# Patient Record
Sex: Female | Born: 1945 | Race: White | Hispanic: No | Marital: Married | Smoking: Current every day smoker
Health system: Southern US, Community
[De-identification: ages and names within clinical notes are randomized; demographics above are authoritative.]

## PROBLEM LIST (undated history)

## (undated) DIAGNOSIS — E7800 Pure hypercholesterolemia, unspecified: Secondary | ICD-10-CM

## (undated) DIAGNOSIS — K219 Gastro-esophageal reflux disease without esophagitis: Secondary | ICD-10-CM

## (undated) DIAGNOSIS — I10 Essential (primary) hypertension: Secondary | ICD-10-CM

## (undated) DIAGNOSIS — G459 Transient cerebral ischemic attack, unspecified: Secondary | ICD-10-CM

## (undated) DIAGNOSIS — D649 Anemia, unspecified: Secondary | ICD-10-CM

## (undated) DIAGNOSIS — K56609 Unspecified intestinal obstruction, unspecified as to partial versus complete obstruction: Secondary | ICD-10-CM

## (undated) DIAGNOSIS — M8580 Other specified disorders of bone density and structure, unspecified site: Secondary | ICD-10-CM

## (undated) DIAGNOSIS — K5010 Crohn's disease of large intestine without complications: Secondary | ICD-10-CM

## (undated) HISTORY — DX: Crohn's disease of large intestine without complications: K50.10

## (undated) HISTORY — DX: Gastro-esophageal reflux disease without esophagitis: K21.9

## (undated) HISTORY — DX: Other specified disorders of bone density and structure, unspecified site: M85.80

## (undated) HISTORY — DX: Transient cerebral ischemic attack, unspecified: G45.9

## (undated) HISTORY — DX: Anemia, unspecified: D64.9

## (undated) HISTORY — DX: Unspecified intestinal obstruction, unspecified as to partial versus complete obstruction: K56.609

## (undated) HISTORY — DX: Pure hypercholesterolemia, unspecified: E78.00

---

## 2000-01-08 ENCOUNTER — Ambulatory Visit (HOSPITAL_COMMUNITY): Admission: RE | Admit: 2000-01-08 | Discharge: 2000-01-08 | Payer: Self-pay | Admitting: Internal Medicine

## 2002-09-13 ENCOUNTER — Encounter: Payer: Self-pay | Admitting: Emergency Medicine

## 2002-09-13 ENCOUNTER — Inpatient Hospital Stay (HOSPITAL_COMMUNITY): Admission: EM | Admit: 2002-09-13 | Discharge: 2002-09-17 | Payer: Self-pay | Admitting: Emergency Medicine

## 2002-09-14 ENCOUNTER — Encounter: Payer: Self-pay | Admitting: Internal Medicine

## 2002-09-15 ENCOUNTER — Encounter (INDEPENDENT_AMBULATORY_CARE_PROVIDER_SITE_OTHER): Payer: Self-pay | Admitting: *Deleted

## 2002-11-10 ENCOUNTER — Encounter: Admission: RE | Admit: 2002-11-10 | Discharge: 2003-02-08 | Payer: Self-pay | Admitting: Internal Medicine

## 2002-12-31 ENCOUNTER — Encounter: Payer: Self-pay | Admitting: *Deleted

## 2002-12-31 ENCOUNTER — Ambulatory Visit (HOSPITAL_COMMUNITY): Admission: RE | Admit: 2002-12-31 | Discharge: 2002-12-31 | Payer: Self-pay | Admitting: *Deleted

## 2003-01-01 ENCOUNTER — Encounter (INDEPENDENT_AMBULATORY_CARE_PROVIDER_SITE_OTHER): Payer: Self-pay | Admitting: Specialist

## 2003-01-01 ENCOUNTER — Inpatient Hospital Stay (HOSPITAL_COMMUNITY): Admission: AD | Admit: 2003-01-01 | Discharge: 2003-01-06 | Payer: Self-pay | Admitting: Internal Medicine

## 2003-01-01 ENCOUNTER — Encounter: Payer: Self-pay | Admitting: Internal Medicine

## 2004-03-15 ENCOUNTER — Other Ambulatory Visit: Admission: RE | Admit: 2004-03-15 | Discharge: 2004-03-15 | Payer: Self-pay | Admitting: Internal Medicine

## 2004-04-06 ENCOUNTER — Ambulatory Visit (HOSPITAL_COMMUNITY): Admission: RE | Admit: 2004-04-06 | Discharge: 2004-04-06 | Payer: Self-pay | Admitting: Internal Medicine

## 2005-10-12 ENCOUNTER — Encounter: Admission: RE | Admit: 2005-10-12 | Discharge: 2005-10-12 | Payer: Self-pay | Admitting: Internal Medicine

## 2006-05-11 ENCOUNTER — Emergency Department (HOSPITAL_COMMUNITY): Admission: EM | Admit: 2006-05-11 | Discharge: 2006-05-11 | Payer: Self-pay | Admitting: Emergency Medicine

## 2006-07-05 ENCOUNTER — Inpatient Hospital Stay (HOSPITAL_COMMUNITY): Admission: EM | Admit: 2006-07-05 | Discharge: 2006-07-11 | Payer: Self-pay | Admitting: Emergency Medicine

## 2006-07-08 ENCOUNTER — Ambulatory Visit: Payer: Self-pay | Admitting: Internal Medicine

## 2006-07-10 ENCOUNTER — Encounter (INDEPENDENT_AMBULATORY_CARE_PROVIDER_SITE_OTHER): Payer: Self-pay | Admitting: Specialist

## 2006-11-12 ENCOUNTER — Encounter: Admission: RE | Admit: 2006-11-12 | Discharge: 2006-11-12 | Payer: Self-pay | Admitting: Gastroenterology

## 2006-11-21 ENCOUNTER — Encounter: Admission: RE | Admit: 2006-11-21 | Discharge: 2006-11-21 | Payer: Self-pay | Admitting: Gastroenterology

## 2007-04-06 ENCOUNTER — Emergency Department (HOSPITAL_COMMUNITY): Admission: EM | Admit: 2007-04-06 | Discharge: 2007-04-06 | Payer: Self-pay | Admitting: Emergency Medicine

## 2007-04-15 ENCOUNTER — Other Ambulatory Visit: Admission: RE | Admit: 2007-04-15 | Discharge: 2007-04-15 | Payer: Self-pay | Admitting: *Deleted

## 2007-07-07 ENCOUNTER — Inpatient Hospital Stay (HOSPITAL_COMMUNITY): Admission: EM | Admit: 2007-07-07 | Discharge: 2007-07-16 | Payer: Self-pay | Admitting: Emergency Medicine

## 2007-07-07 ENCOUNTER — Ambulatory Visit: Payer: Self-pay | Admitting: Internal Medicine

## 2007-07-08 ENCOUNTER — Encounter (INDEPENDENT_AMBULATORY_CARE_PROVIDER_SITE_OTHER): Payer: Self-pay | Admitting: General Surgery

## 2007-07-08 HISTORY — PX: COLON SURGERY: SHX602

## 2007-08-07 ENCOUNTER — Inpatient Hospital Stay (HOSPITAL_COMMUNITY): Admission: AD | Admit: 2007-08-07 | Discharge: 2007-08-11 | Payer: Self-pay | Admitting: Gastroenterology

## 2007-09-10 ENCOUNTER — Inpatient Hospital Stay (HOSPITAL_COMMUNITY): Admission: EM | Admit: 2007-09-10 | Discharge: 2007-09-13 | Payer: Self-pay | Admitting: Emergency Medicine

## 2007-09-10 ENCOUNTER — Ambulatory Visit: Payer: Self-pay | Admitting: Internal Medicine

## 2008-02-26 ENCOUNTER — Encounter (INDEPENDENT_AMBULATORY_CARE_PROVIDER_SITE_OTHER): Payer: Self-pay | Admitting: General Surgery

## 2008-02-26 ENCOUNTER — Inpatient Hospital Stay (HOSPITAL_COMMUNITY): Admission: RE | Admit: 2008-02-26 | Discharge: 2008-03-02 | Payer: Self-pay | Admitting: General Surgery

## 2008-02-26 HISTORY — PX: ILEOSTOMY: SHX1783

## 2008-03-29 IMAGING — CR DG ABDOMEN ACUTE W/ 1V CHEST
3 series · 3 of 3 positions shown · non-contrast
Comparison: 07/08/2007

CLINICAL DATA: Ileostomy one month ago. Nausea, vomiting

ABDOMEN SERIES - 2 VIEW & CHEST - 1 VIEW

[w chest pa]
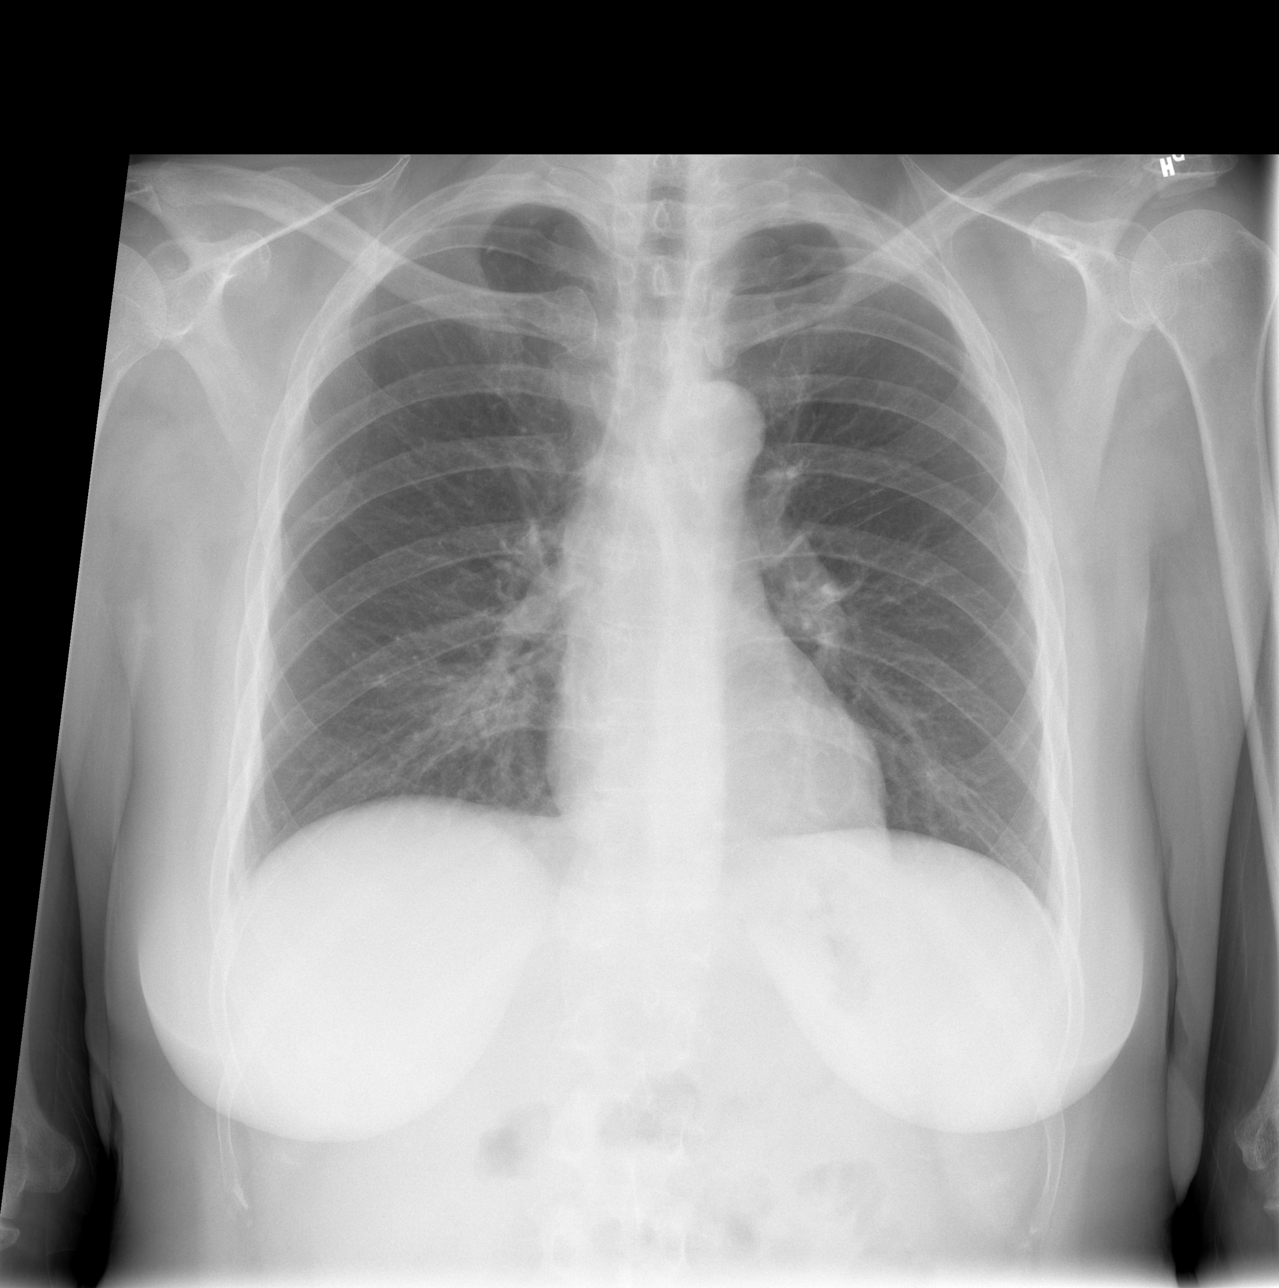

[w abdomen upright]
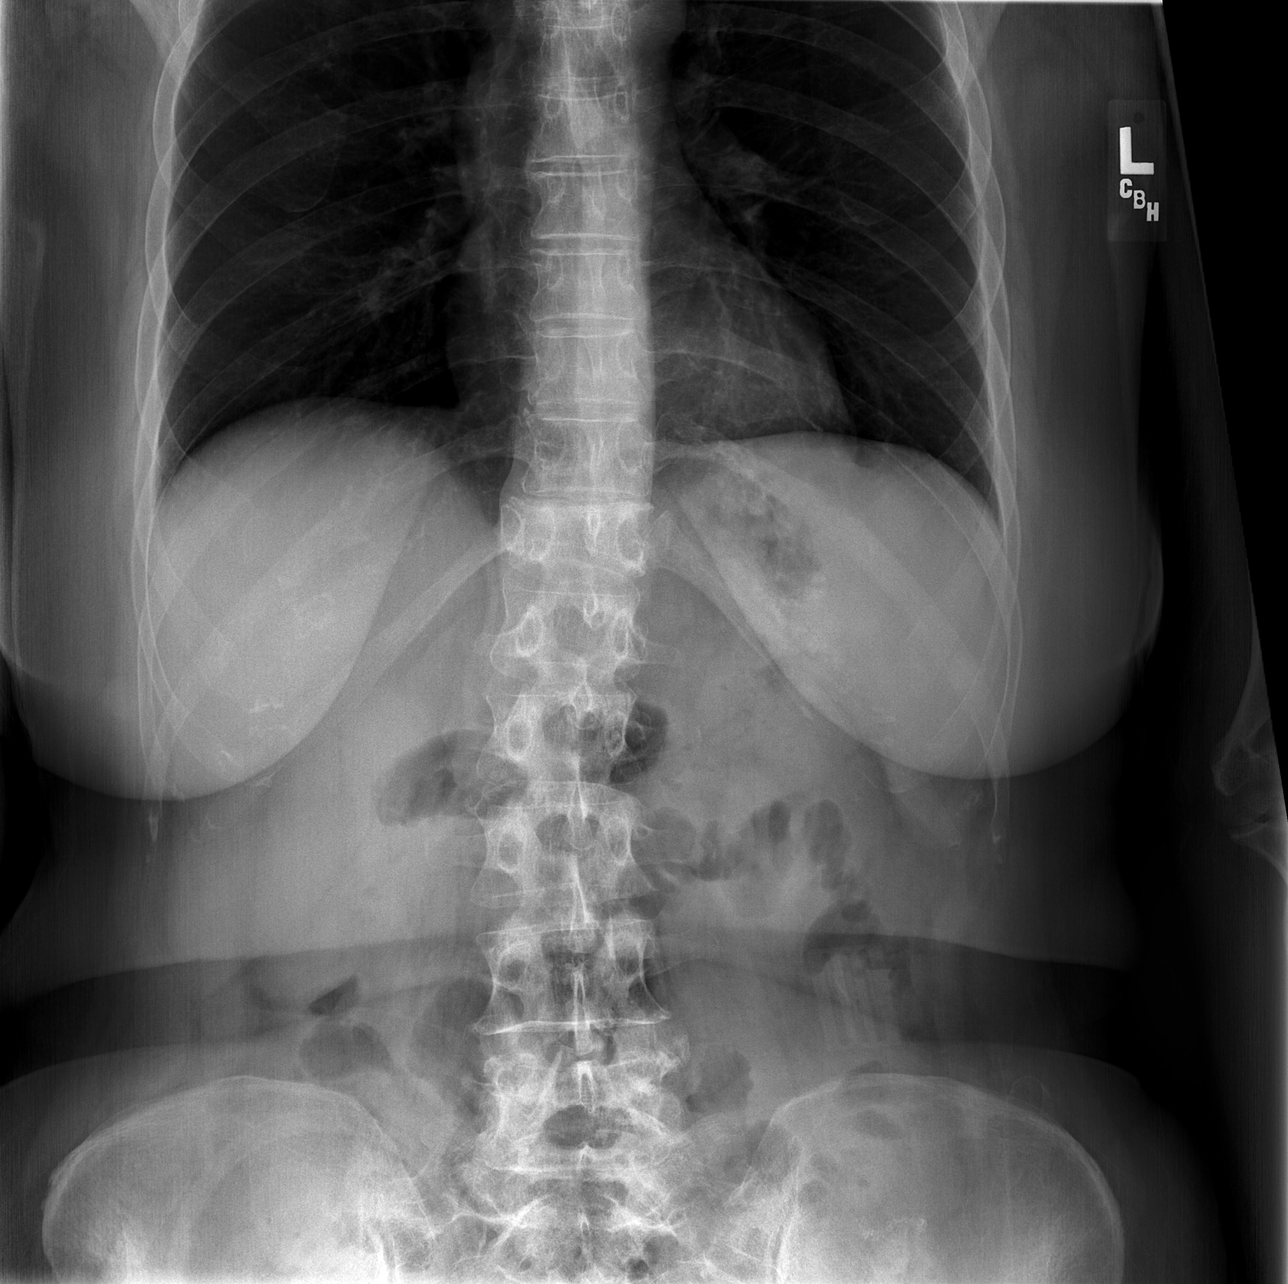

[t abdomen supine]
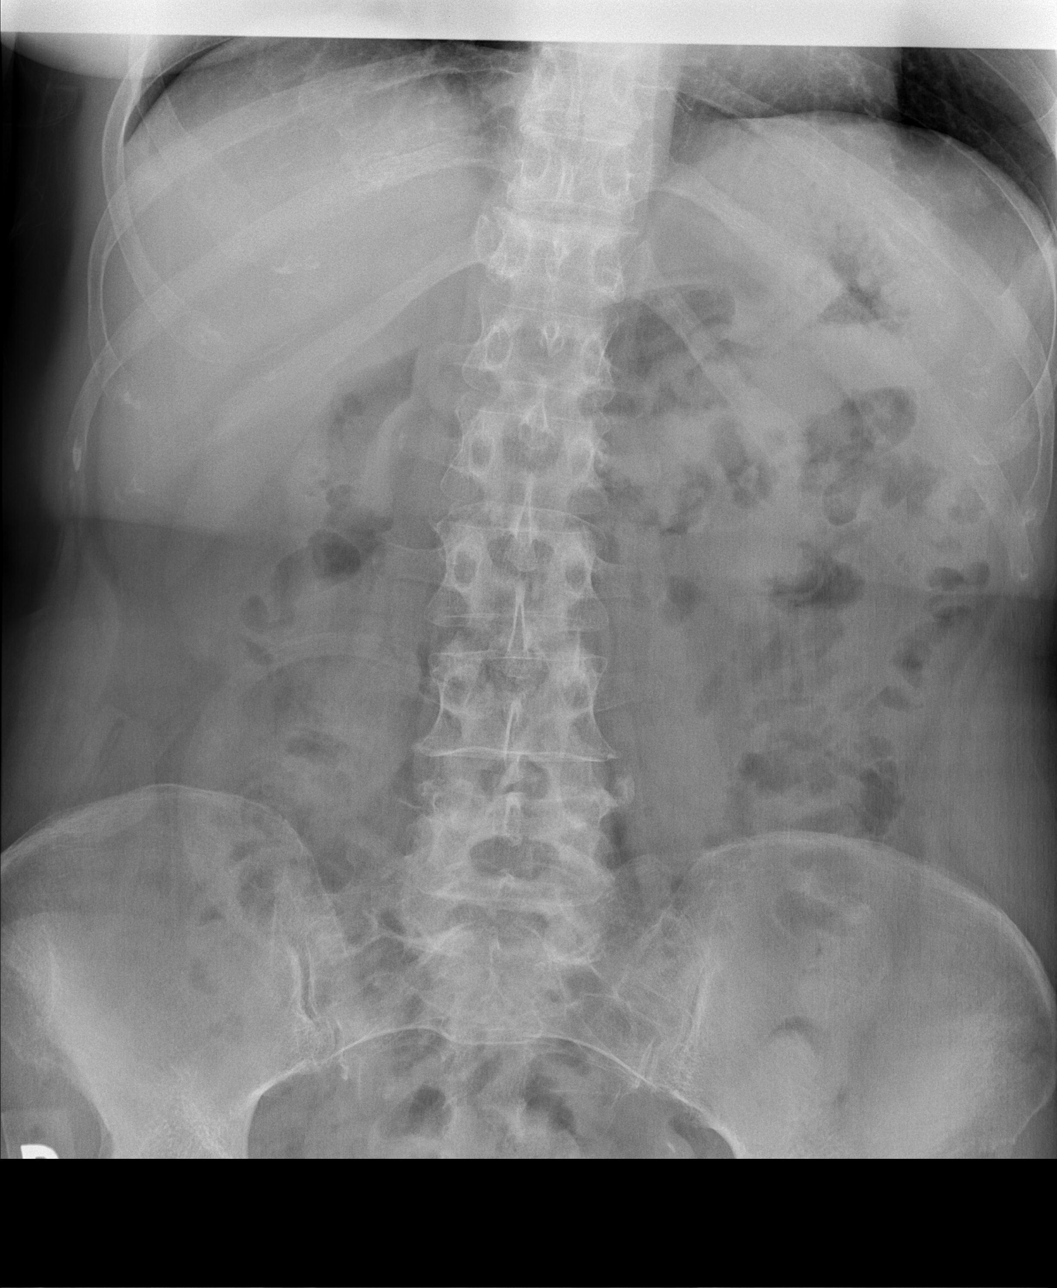

[3 of 3 positions shown; findings below may reference images not displayed]

FINDINGS: There a nonobstructed bowel gas pattern. No free air, organomegaly,
or suspicious calcification.

Heart is normal size. Lungs are clear. Visualized skeleton unremarkable except
for degenerative changes in the lower lumbar spine.

IMPRESSION

No obstruction or free air. No acute findings.

## 2008-05-02 IMAGING — US US RENAL PORT
1 series · 14 of 22 positions shown · non-contrast
Comparison: none

CLINICAL DATA: Abdominal pain, vomiting, history of Crohn's disease.  Evaluate for obstruction.
PORTABLE RENAL ULTRASOUND:
TECHNIQUE: Portable ultrasound examination of the urinary tract was performed including evaluation of the kidneys, renal collecting systems, and urinary bladder.

[Series 1: unknown · 0.33mm/px · 14 of 22 slices shown]
[im 1/22]
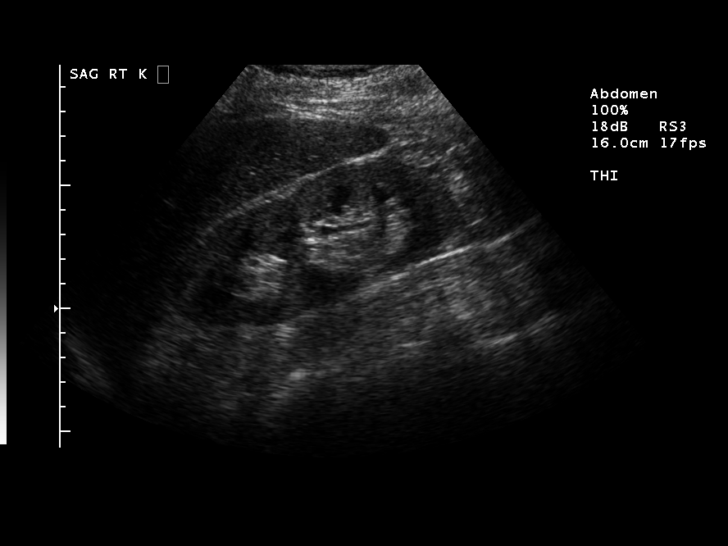
[im 3/22]
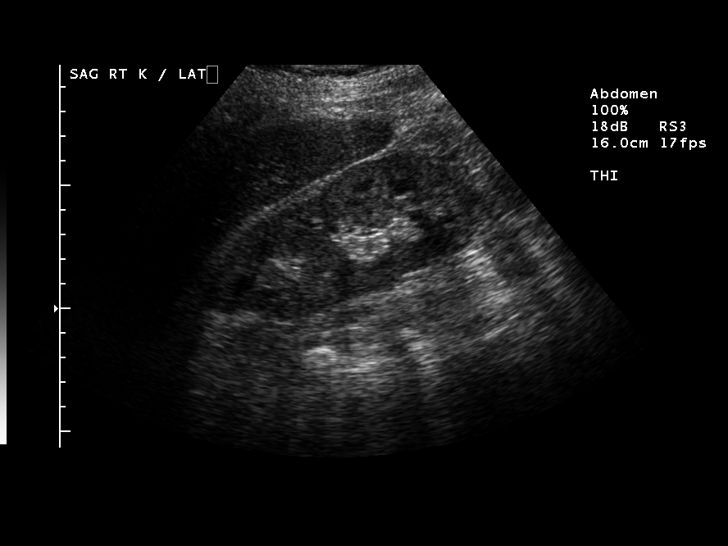
[im 4/22]
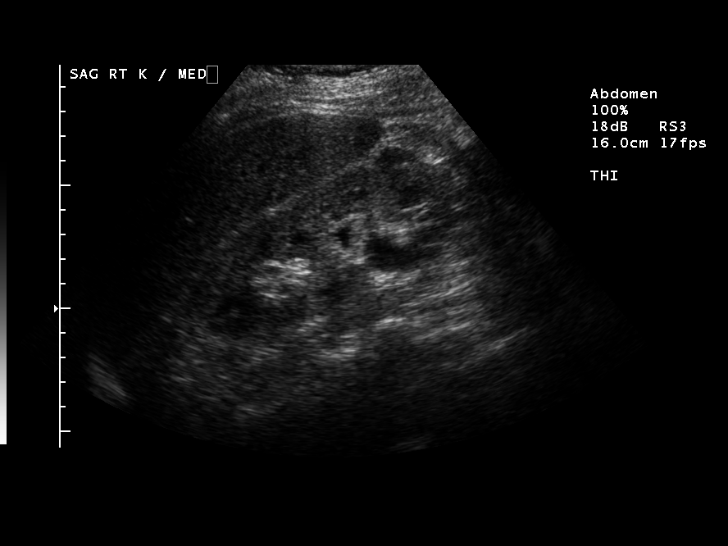
[im 6/22]
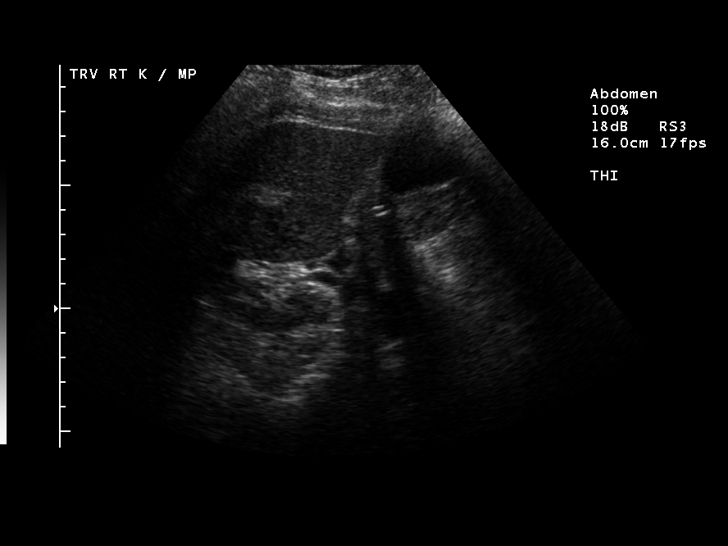
[im 8/22]
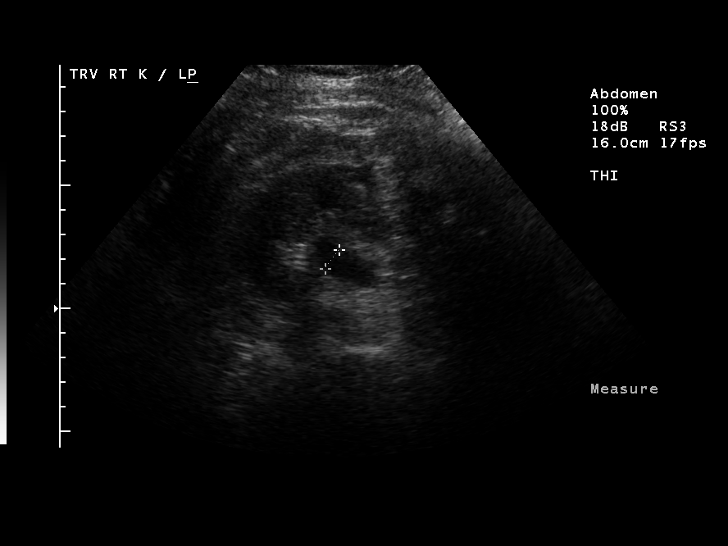
[im 9/22]
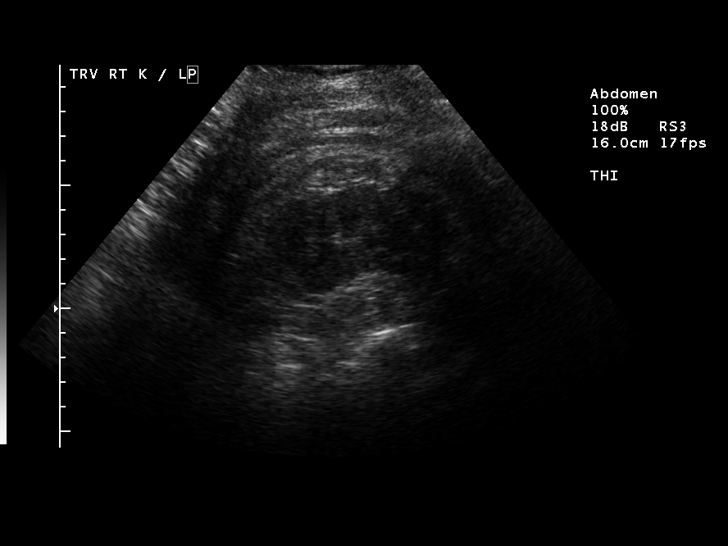
[im 11/22]
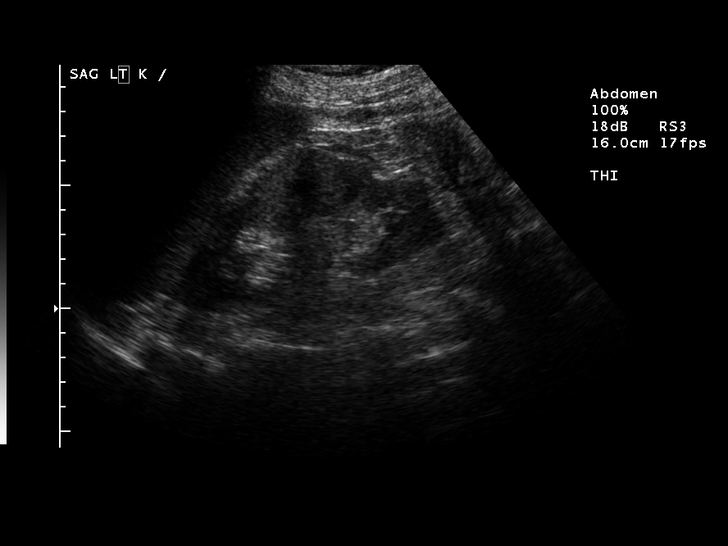
[im 12/22]
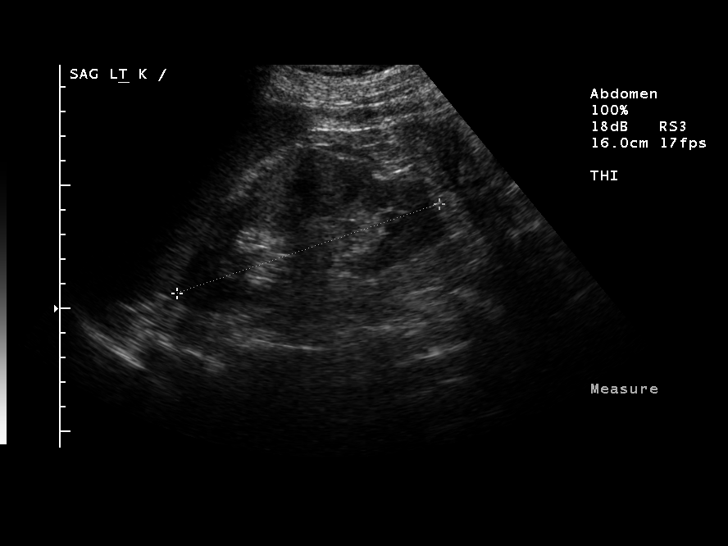
[im 14/22]
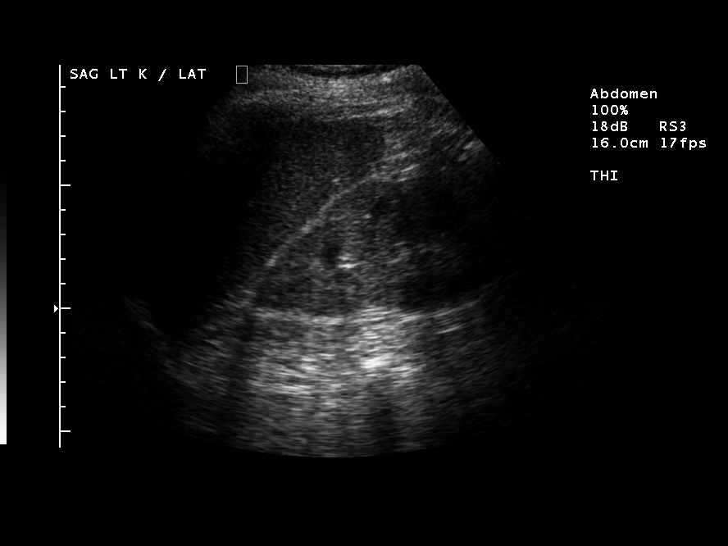
[im 15/22]
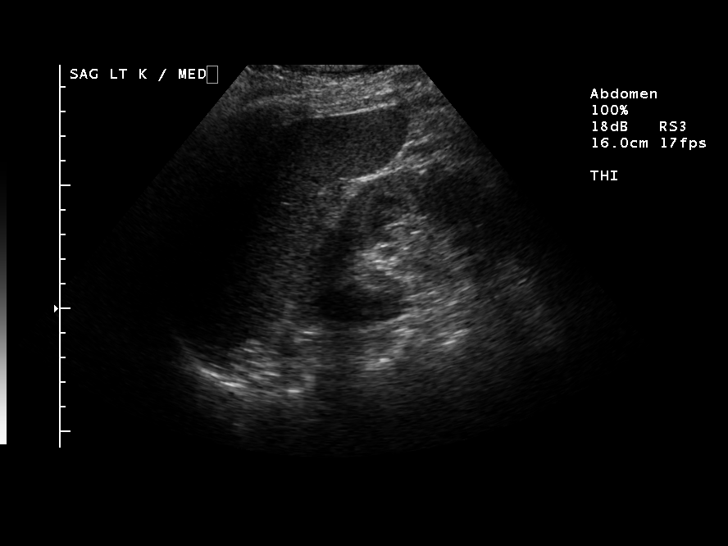
[im 17/22]
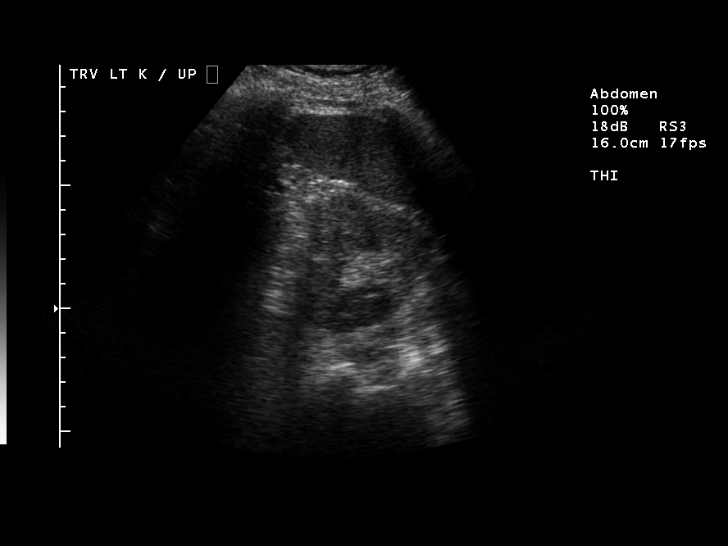
[im 19/22]
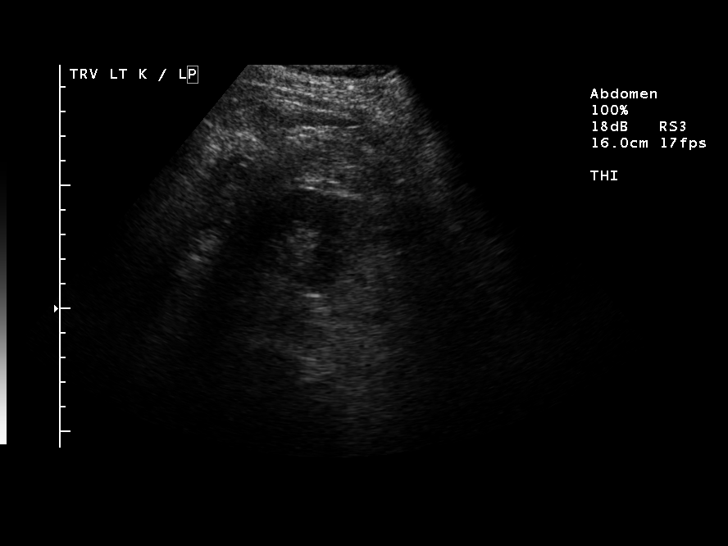
[im 20/22]
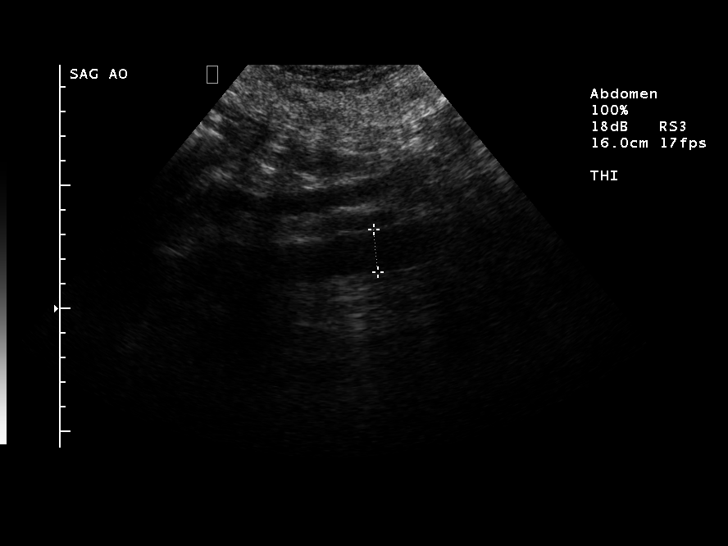
[im 22/22]
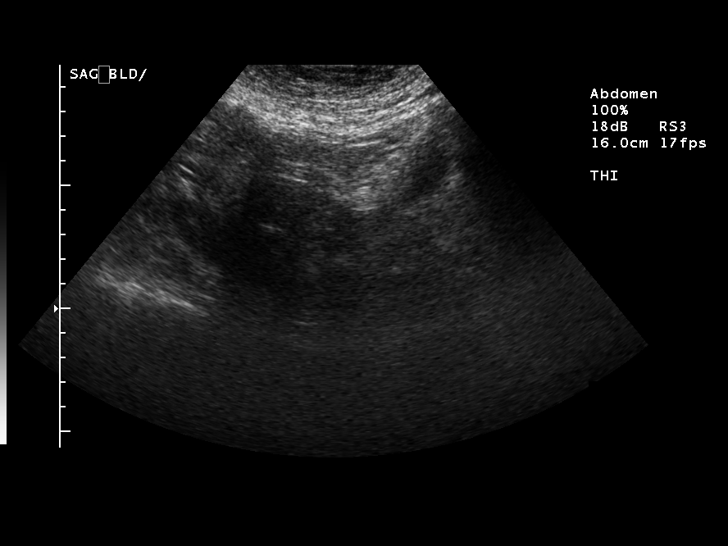

[14 of 22 positions shown; findings below may reference images not displayed]

FINDINGS: Right kidney measures 12.2 cm in length.  No significant hydronephrosis or shadowing calculi appreciated.  Right kidney demonstrates a prominent central column of cortex suspicious for a duplicated collecting system.  There is slight pelviectasis of the right kidney lower pole, nonspecific in appearance.  The left kidney is normal in appearance without hydronephrosis or obstruction.  The left renal length is 11.2 cm.   The bladder is decompressed with a Foley catheter.  Aorta demonstrates no evidence of aneurysm or dilatation.  No ascites or free fluid.
IMPRESSION: 1.  Right kidney duplicated system suspected with mild pelviectasis of the lower pole.  No significant hydronephrosis or obstruction. 
2.  Normal left kidney.

## 2008-08-12 ENCOUNTER — Other Ambulatory Visit: Admission: RE | Admit: 2008-08-12 | Discharge: 2008-08-12 | Payer: Self-pay | Admitting: Internal Medicine

## 2009-04-26 ENCOUNTER — Encounter: Admission: RE | Admit: 2009-04-26 | Discharge: 2009-04-26 | Payer: Self-pay | Admitting: Gastroenterology

## 2009-09-21 ENCOUNTER — Other Ambulatory Visit: Admission: RE | Admit: 2009-09-21 | Discharge: 2009-09-21 | Payer: Self-pay | Admitting: Internal Medicine

## 2009-10-20 ENCOUNTER — Ambulatory Visit: Payer: Self-pay | Admitting: Diagnostic Radiology

## 2009-10-20 ENCOUNTER — Encounter: Payer: Self-pay | Admitting: Emergency Medicine

## 2009-10-21 ENCOUNTER — Inpatient Hospital Stay (HOSPITAL_COMMUNITY): Admission: EM | Admit: 2009-10-21 | Discharge: 2009-10-23 | Payer: Self-pay | Admitting: Emergency Medicine

## 2009-11-04 ENCOUNTER — Emergency Department (HOSPITAL_COMMUNITY): Admission: EM | Admit: 2009-11-04 | Discharge: 2009-11-05 | Payer: Self-pay | Admitting: Emergency Medicine

## 2009-11-21 ENCOUNTER — Inpatient Hospital Stay (HOSPITAL_COMMUNITY): Admission: RE | Admit: 2009-11-21 | Discharge: 2009-11-24 | Payer: Self-pay | Admitting: General Surgery

## 2009-11-21 HISTORY — PX: HERNIA REPAIR: SHX51

## 2010-06-13 IMAGING — CR DG ABDOMEN ACUTE W/ 1V CHEST
3 series · 3 of 3 positions shown · non-contrast
Comparison: CT abdomen pelvis 10/20/2009 and chest x-ray 02/23/2008

CLINICAL DATA: Small bowel obstruction.  Nasogastric tube
placement.  Diarrhea with nausea and vomiting.

ACUTE ABDOMEN SERIES (ABDOMEN 2 VIEW & CHEST 1 VIEW)

[w chest pa]
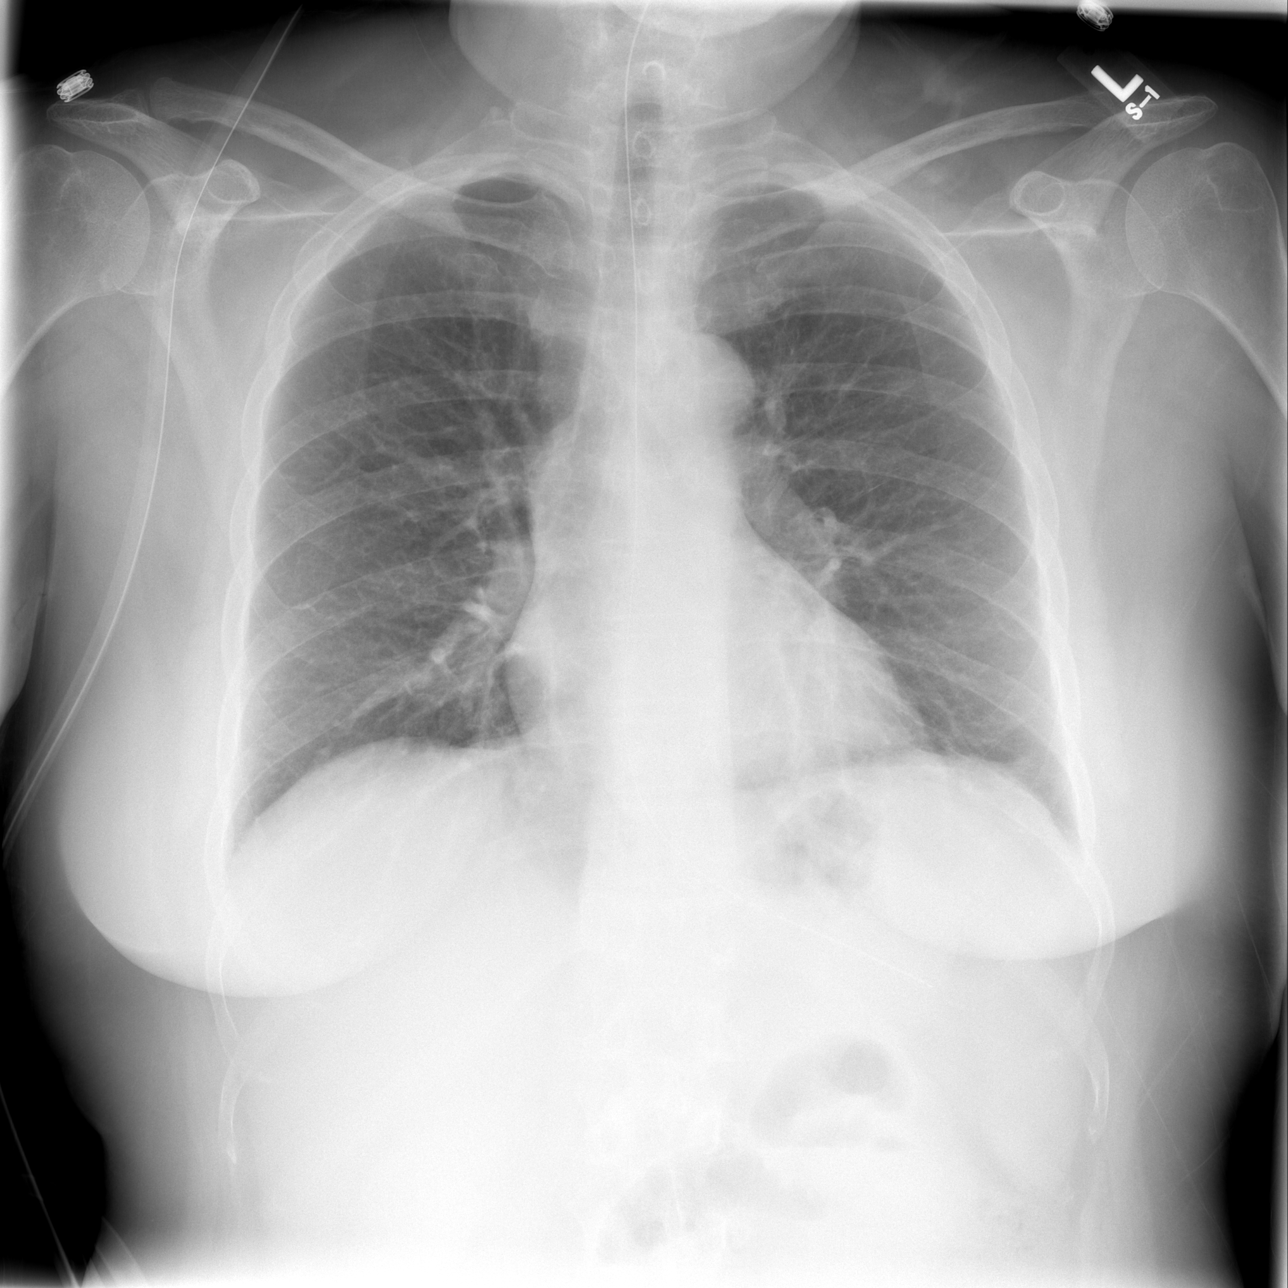

[w abdomen upright]
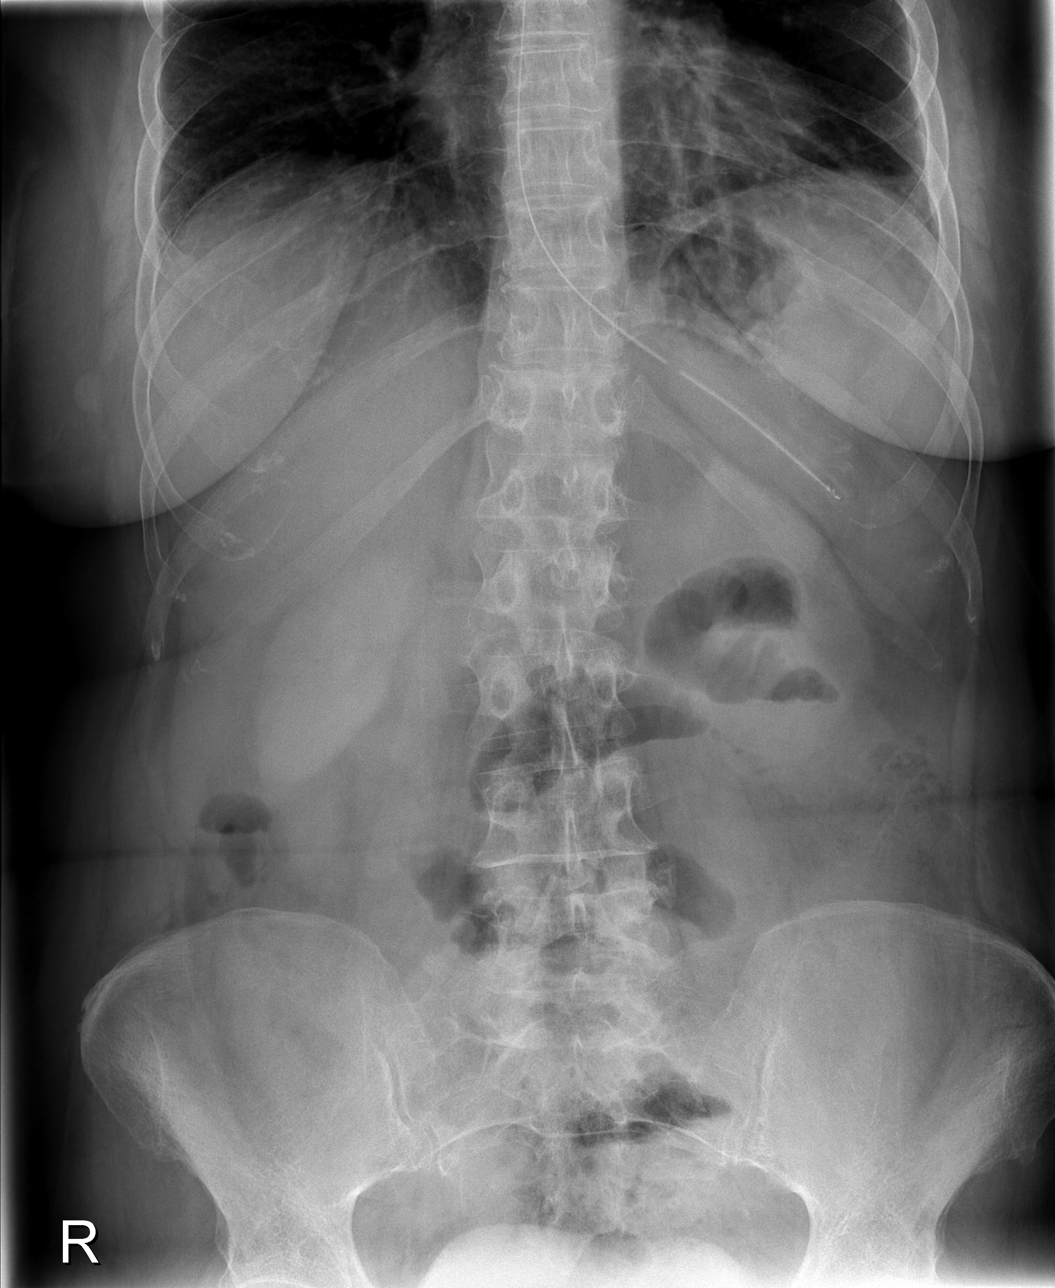

[t abdomen supine]
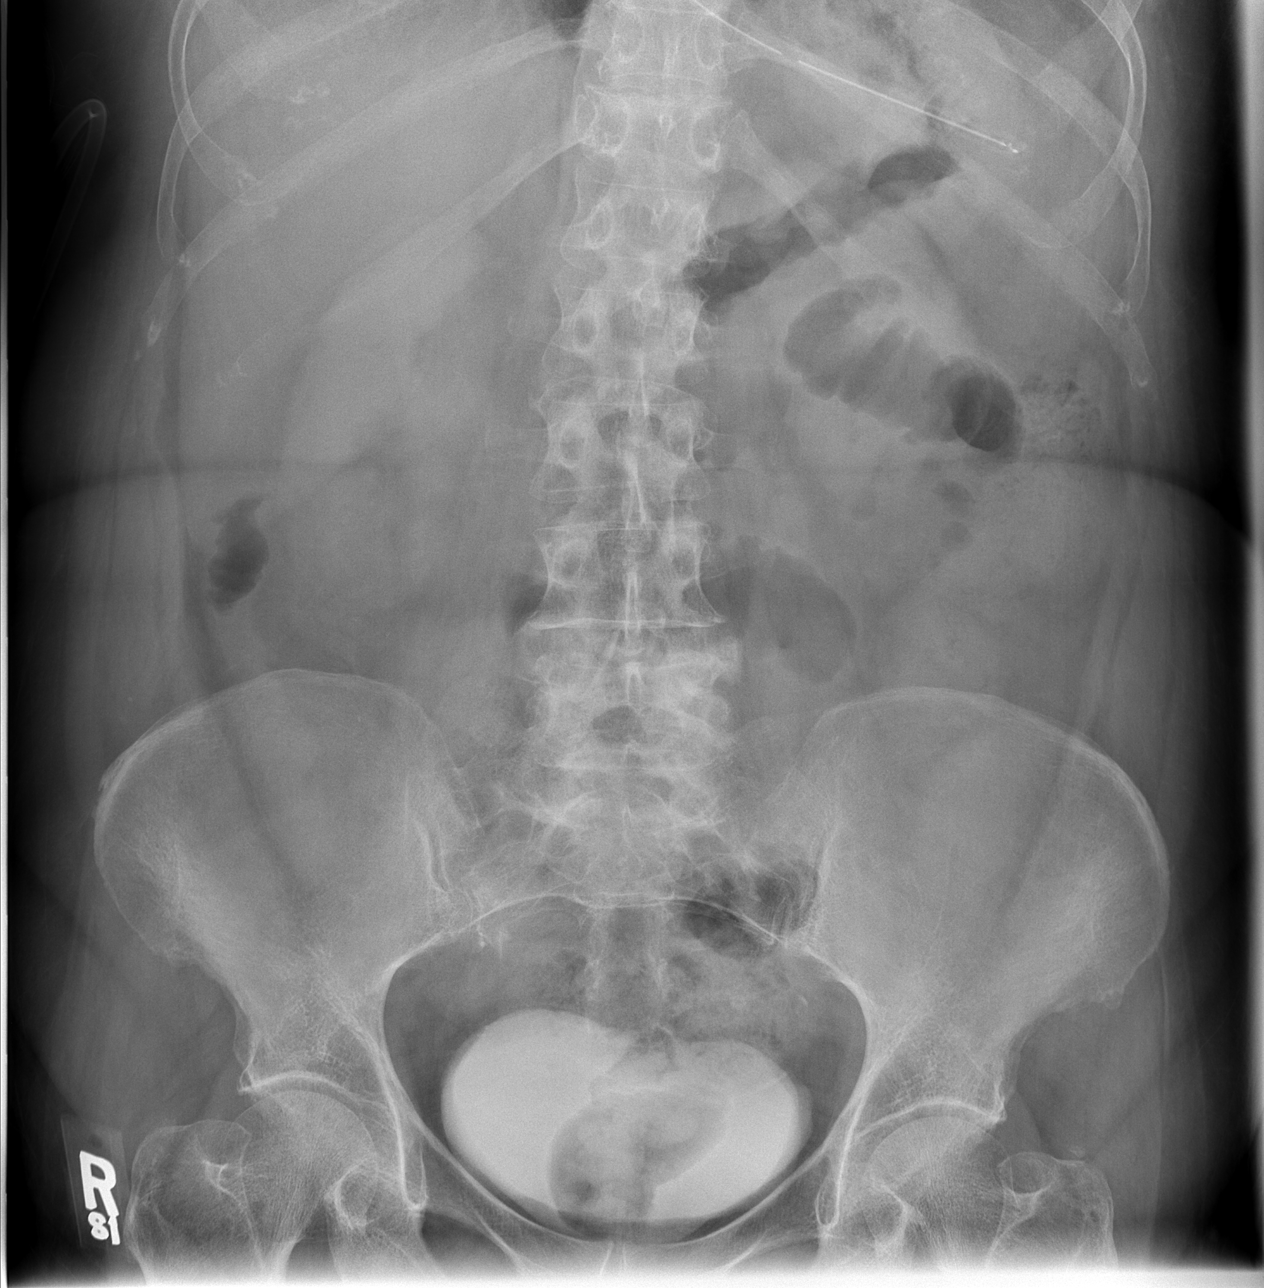

[3 of 3 positions shown; findings below may reference images not displayed]

FINDINGS: Frontal view of the chest shows midline trachea and
normal heart size.  Nasogastric tube terminates in the stomach.
Minimal bibasilar subsegmental atelectasis.

Two views of the abdomen show mildly prominent loops of gas-filled
small bowel in the central abdomen.  Minimal colonic gas.  Gas and
stool are seen in the rectosigmoid colon.  Contrast is seen in the
bladder.
IMPRESSION: Small bowel obstructive pattern may have improved minimally after
nasogastric tube placement.

## 2010-11-22 LAB — BASIC METABOLIC PANEL
BUN: 11 mg/dL (ref 6–23)
BUN: 26 mg/dL — ABNORMAL HIGH (ref 6–23)
CO2: 25 mEq/L (ref 19–32)
CO2: 26 mEq/L (ref 19–32)
Calcium: 8 mg/dL — ABNORMAL LOW (ref 8.4–10.5)
Calcium: 8.3 mg/dL — ABNORMAL LOW (ref 8.4–10.5)
Chloride: 105 mEq/L (ref 96–112)
Chloride: 110 mEq/L (ref 96–112)
Creatinine, Ser: 0.83 mg/dL (ref 0.4–1.2)
Creatinine, Ser: 1.19 mg/dL (ref 0.4–1.2)
GFR calc Af Amer: 55 mL/min — ABNORMAL LOW (ref >60)
GFR calc Af Amer: >60 mL/min (ref >60)
GFR calc non Af Amer: 46 mL/min — ABNORMAL LOW (ref >60)
GFR calc non Af Amer: >60 mL/min (ref >60)
Glucose, Bld: 117 mg/dL — ABNORMAL HIGH (ref 70–99)
Glucose, Bld: 289 mg/dL — ABNORMAL HIGH (ref 70–99)
Potassium: 4 mEq/L (ref 3.5–5.1)
Potassium: 4.7 mEq/L (ref 3.5–5.1)
Sodium: 135 mEq/L (ref 135–145)
Sodium: 140 mEq/L (ref 135–145)

## 2010-11-22 LAB — CBC
HCT: 31.3 % — ABNORMAL LOW (ref 36.0–46.0)
HCT: 34.5 % — ABNORMAL LOW (ref 36.0–46.0)
Hemoglobin: 11 g/dL — ABNORMAL LOW (ref 12.0–15.0)
Hemoglobin: 12.1 g/dL (ref 12.0–15.0)
MCHC: 35.1 g/dL (ref 30.0–36.0)
MCHC: 35.1 g/dL (ref 30.0–36.0)
MCV: 94.9 fL (ref 78.0–100.0)
MCV: 95 fL (ref 78.0–100.0)
Platelets: 182 10*3/uL (ref 150–400)
Platelets: 214 10*3/uL (ref 150–400)
RBC: 3.3 MIL/uL — ABNORMAL LOW (ref 3.87–5.11)
RBC: 3.63 MIL/uL — ABNORMAL LOW (ref 3.87–5.11)
RDW: 12.6 % (ref 11.5–15.5)
RDW: 12.7 % (ref 11.5–15.5)
WBC: 14.5 10*3/uL — ABNORMAL HIGH (ref 4.0–10.5)
WBC: 6.1 10*3/uL (ref 4.0–10.5)

## 2010-11-22 LAB — GLUCOSE, CAPILLARY
Glucose-Capillary: 110 mg/dL — ABNORMAL HIGH (ref 70–99)
Glucose-Capillary: 124 mg/dL — ABNORMAL HIGH (ref 70–99)
Glucose-Capillary: 126 mg/dL — ABNORMAL HIGH (ref 70–99)
Glucose-Capillary: 160 mg/dL — ABNORMAL HIGH (ref 70–99)
Glucose-Capillary: 170 mg/dL — ABNORMAL HIGH (ref 70–99)
Glucose-Capillary: 173 mg/dL — ABNORMAL HIGH (ref 70–99)
Glucose-Capillary: 228 mg/dL — ABNORMAL HIGH (ref 70–99)
Glucose-Capillary: 271 mg/dL — ABNORMAL HIGH (ref 70–99)
Glucose-Capillary: 296 mg/dL — ABNORMAL HIGH (ref 70–99)
Glucose-Capillary: 86 mg/dL (ref 70–99)
Glucose-Capillary: 95 mg/dL (ref 70–99)
Glucose-Capillary: 96 mg/dL (ref 70–99)
Glucose-Capillary: 97 mg/dL (ref 70–99)

## 2010-11-22 LAB — DIFFERENTIAL
Basophils Absolute: 0 10*3/uL (ref 0.0–0.1)
Basophils Relative: 0 % (ref 0–1)
Eosinophils Absolute: 0 10*3/uL (ref 0.0–0.7)
Eosinophils Relative: 0 % (ref 0–5)
Lymphocytes Relative: 6 % — ABNORMAL LOW (ref 12–46)
Lymphs Abs: 0.9 10*3/uL (ref 0.7–4.0)
Monocytes Absolute: 0.5 10*3/uL (ref 0.1–1.0)
Monocytes Relative: 4 % (ref 3–12)
Neutro Abs: 13.1 10*3/uL — ABNORMAL HIGH (ref 1.7–7.7)
Neutrophils Relative %: 90 % — ABNORMAL HIGH (ref 43–77)

## 2010-11-22 LAB — HEMOGLOBIN A1C
Hgb A1c MFr Bld: 7.9 % — ABNORMAL HIGH (ref 4.6–6.1)
Mean Plasma Glucose: 180 mg/dL

## 2010-11-22 LAB — LACTIC ACID, PLASMA: Lactic Acid, Venous: 2.4 mmol/L — ABNORMAL HIGH (ref 0.5–2.2)

## 2010-11-23 LAB — DIFFERENTIAL
Band Neutrophils: 0 % (ref 0–10)
Basophils Absolute: 0 10*3/uL (ref 0.0–0.1)
Basophils Relative: 0 % (ref 0–1)
Blasts: 0 %
Eosinophils Absolute: 0.2 10*3/uL (ref 0.0–0.7)
Eosinophils Relative: 1 % (ref 0–5)
Lymphocytes Relative: 8 % — ABNORMAL LOW (ref 12–46)
Lymphs Abs: 1.8 10*3/uL (ref 0.7–4.0)
Metamyelocytes Relative: 0 %
Monocytes Absolute: 1.1 10*3/uL — ABNORMAL HIGH (ref 0.1–1.0)
Monocytes Relative: 5 % (ref 3–12)
Myelocytes: 0 %
Neutro Abs: 19.5 10*3/uL — ABNORMAL HIGH (ref 1.7–7.7)
Neutrophils Relative %: 86 % — ABNORMAL HIGH (ref 43–77)
Promyelocytes Absolute: 0 %
nRBC: 0 /100 WBC

## 2010-11-23 LAB — COMPREHENSIVE METABOLIC PANEL
ALT: 34 U/L (ref 0–35)
AST: 33 U/L (ref 0–37)
Albumin: 4.9 g/dL (ref 3.5–5.2)
Alkaline Phosphatase: 110 U/L (ref 39–117)
BUN: 32 mg/dL — ABNORMAL HIGH (ref 6–23)
CO2: 30 mEq/L (ref 19–32)
Calcium: 10.4 mg/dL (ref 8.4–10.5)
Chloride: 98 mEq/L (ref 96–112)
Creatinine, Ser: 1.4 mg/dL — ABNORMAL HIGH (ref 0.4–1.2)
GFR calc Af Amer: 46 mL/min — ABNORMAL LOW (ref >60)
GFR calc non Af Amer: 38 mL/min — ABNORMAL LOW (ref >60)
Glucose, Bld: 265 mg/dL — ABNORMAL HIGH (ref 70–99)
Potassium: 4.9 mEq/L (ref 3.5–5.1)
Sodium: 141 mEq/L (ref 135–145)
Total Bilirubin: 0.6 mg/dL (ref 0.3–1.2)
Total Protein: 8.4 g/dL — ABNORMAL HIGH (ref 6.0–8.3)

## 2010-11-23 LAB — URINALYSIS, ROUTINE W REFLEX MICROSCOPIC
Bilirubin Urine: NEGATIVE
Glucose, UA: NEGATIVE mg/dL
Hgb urine dipstick: NEGATIVE
Ketones, ur: NEGATIVE mg/dL
Nitrite: NEGATIVE
Protein, ur: 30 mg/dL — AB
Specific Gravity, Urine: 1.023 (ref 1.005–1.030)
Urobilinogen, UA: 0.2 mg/dL (ref 0.0–1.0)
pH: 5 (ref 5.0–8.0)

## 2010-11-23 LAB — LIPASE, BLOOD: Lipase: 59 U/L (ref 23–300)

## 2010-11-23 LAB — CBC
HCT: 43.4 % (ref 36.0–46.0)
Hemoglobin: 15 g/dL (ref 12.0–15.0)
MCHC: 34.5 g/dL (ref 30.0–36.0)
MCV: 93.8 fL (ref 78.0–100.0)
Platelets: 351 10*3/uL (ref 150–400)
RBC: 4.63 MIL/uL (ref 3.87–5.11)
RDW: 12 % (ref 11.5–15.5)
WBC: 22.6 10*3/uL — ABNORMAL HIGH (ref 4.0–10.5)

## 2010-11-23 LAB — URINE MICROSCOPIC-ADD ON

## 2010-11-23 LAB — PROTIME-INR
INR: 1.06 (ref 0.00–1.49)
Prothrombin Time: 13.7 seconds (ref 11.6–15.2)

## 2010-11-23 LAB — GLUCOSE, CAPILLARY: Glucose-Capillary: 228 mg/dL — ABNORMAL HIGH (ref 70–99)

## 2010-11-24 LAB — DIFFERENTIAL
Basophils Absolute: 0 10*3/uL (ref 0.0–0.1)
Basophils Relative: 0 % (ref 0–1)
Eosinophils Absolute: 0 10*3/uL (ref 0.0–0.7)
Eosinophils Relative: 0 % (ref 0–5)
Lymphocytes Relative: 4 % — ABNORMAL LOW (ref 12–46)
Lymphs Abs: 0.6 10*3/uL — ABNORMAL LOW (ref 0.7–4.0)
Monocytes Absolute: 0.6 10*3/uL (ref 0.1–1.0)
Monocytes Relative: 4 % (ref 3–12)
Neutro Abs: 13.4 10*3/uL — ABNORMAL HIGH (ref 1.7–7.7)
Neutrophils Relative %: 92 % — ABNORMAL HIGH (ref 43–77)

## 2010-11-24 LAB — POCT I-STAT, CHEM 8
BUN: 30 mg/dL — ABNORMAL HIGH (ref 6–23)
Calcium, Ion: 1.11 mmol/L — ABNORMAL LOW (ref 1.12–1.32)
Chloride: 110 mEq/L (ref 96–112)
Creatinine, Ser: 0.9 mg/dL (ref 0.4–1.2)
Glucose, Bld: 196 mg/dL — ABNORMAL HIGH (ref 70–99)
HCT: 35 % — ABNORMAL LOW (ref 36.0–46.0)
Hemoglobin: 11.9 g/dL — ABNORMAL LOW (ref 12.0–15.0)
Potassium: 4.4 mEq/L (ref 3.5–5.1)
Sodium: 138 mEq/L (ref 135–145)
TCO2: 19 mmol/L (ref 0–100)

## 2010-11-24 LAB — CBC
HCT: 35.1 % — ABNORMAL LOW (ref 36.0–46.0)
HCT: 37.3 % (ref 36.0–46.0)
Hemoglobin: 12.2 g/dL (ref 12.0–15.0)
Hemoglobin: 12.5 g/dL (ref 12.0–15.0)
MCHC: 33.5 g/dL (ref 30.0–36.0)
MCHC: 34.9 g/dL (ref 30.0–36.0)
MCV: 94.6 fL (ref 78.0–100.0)
MCV: 95.4 fL (ref 78.0–100.0)
Platelets: 244 10*3/uL (ref 150–400)
Platelets: 274 10*3/uL (ref 150–400)
RBC: 3.67 MIL/uL — ABNORMAL LOW (ref 3.87–5.11)
RBC: 3.94 MIL/uL (ref 3.87–5.11)
RDW: 12.6 % (ref 11.5–15.5)
RDW: 12.8 % (ref 11.5–15.5)
WBC: 14.6 10*3/uL — ABNORMAL HIGH (ref 4.0–10.5)
WBC: 7.6 10*3/uL (ref 4.0–10.5)

## 2010-11-24 LAB — URINALYSIS, ROUTINE W REFLEX MICROSCOPIC
Glucose, UA: NEGATIVE mg/dL
Hgb urine dipstick: NEGATIVE
Ketones, ur: NEGATIVE mg/dL
Nitrite: NEGATIVE
Protein, ur: NEGATIVE mg/dL
Specific Gravity, Urine: 1.018 (ref 1.005–1.030)
Urobilinogen, UA: 0.2 mg/dL (ref 0.0–1.0)
pH: 5 (ref 5.0–8.0)

## 2010-11-24 LAB — COMPREHENSIVE METABOLIC PANEL
ALT: 25 U/L (ref 0–35)
AST: 17 U/L (ref 0–37)
Albumin: 4 g/dL (ref 3.5–5.2)
Alkaline Phosphatase: 66 U/L (ref 39–117)
BUN: 36 mg/dL — ABNORMAL HIGH (ref 6–23)
CO2: 21 mEq/L (ref 19–32)
Calcium: 8.7 mg/dL (ref 8.4–10.5)
Chloride: 108 mEq/L (ref 96–112)
Creatinine, Ser: 1.19 mg/dL (ref 0.4–1.2)
GFR calc Af Amer: 55 mL/min — ABNORMAL LOW (ref >60)
GFR calc non Af Amer: 46 mL/min — ABNORMAL LOW (ref >60)
Glucose, Bld: 156 mg/dL — ABNORMAL HIGH (ref 70–99)
Potassium: 4.4 mEq/L (ref 3.5–5.1)
Sodium: 135 mEq/L (ref 135–145)
Total Bilirubin: 0.4 mg/dL (ref 0.3–1.2)
Total Protein: 6.9 g/dL (ref 6.0–8.3)

## 2010-11-24 LAB — URINE MICROSCOPIC-ADD ON

## 2010-11-24 LAB — LIPASE, BLOOD: Lipase: 18 U/L (ref 11–59)

## 2010-11-26 LAB — GLUCOSE, CAPILLARY
Glucose-Capillary: 105 mg/dL — ABNORMAL HIGH (ref 70–99)
Glucose-Capillary: 119 mg/dL — ABNORMAL HIGH (ref 70–99)
Glucose-Capillary: 121 mg/dL — ABNORMAL HIGH (ref 70–99)
Glucose-Capillary: 129 mg/dL — ABNORMAL HIGH (ref 70–99)
Glucose-Capillary: 136 mg/dL — ABNORMAL HIGH (ref 70–99)
Glucose-Capillary: 144 mg/dL — ABNORMAL HIGH (ref 70–99)
Glucose-Capillary: 146 mg/dL — ABNORMAL HIGH (ref 70–99)
Glucose-Capillary: 164 mg/dL — ABNORMAL HIGH (ref 70–99)
Glucose-Capillary: 171 mg/dL — ABNORMAL HIGH (ref 70–99)
Glucose-Capillary: 173 mg/dL — ABNORMAL HIGH (ref 70–99)
Glucose-Capillary: 177 mg/dL — ABNORMAL HIGH (ref 70–99)
Glucose-Capillary: 178 mg/dL — ABNORMAL HIGH (ref 70–99)
Glucose-Capillary: 178 mg/dL — ABNORMAL HIGH (ref 70–99)
Glucose-Capillary: 183 mg/dL — ABNORMAL HIGH (ref 70–99)
Glucose-Capillary: 183 mg/dL — ABNORMAL HIGH (ref 70–99)
Glucose-Capillary: 185 mg/dL — ABNORMAL HIGH (ref 70–99)
Glucose-Capillary: 187 mg/dL — ABNORMAL HIGH (ref 70–99)
Glucose-Capillary: 190 mg/dL — ABNORMAL HIGH (ref 70–99)
Glucose-Capillary: 204 mg/dL — ABNORMAL HIGH (ref 70–99)
Glucose-Capillary: 204 mg/dL — ABNORMAL HIGH (ref 70–99)
Glucose-Capillary: 213 mg/dL — ABNORMAL HIGH (ref 70–99)
Glucose-Capillary: 94 mg/dL (ref 70–99)
Glucose-Capillary: 98 mg/dL (ref 70–99)

## 2010-11-26 LAB — URINALYSIS, ROUTINE W REFLEX MICROSCOPIC
Bilirubin Urine: NEGATIVE
Glucose, UA: NEGATIVE mg/dL
Hgb urine dipstick: NEGATIVE
Ketones, ur: NEGATIVE mg/dL
Nitrite: NEGATIVE
Protein, ur: NEGATIVE mg/dL
Specific Gravity, Urine: 1.017 (ref 1.005–1.030)
Urobilinogen, UA: 0.2 mg/dL (ref 0.0–1.0)
pH: 5 (ref 5.0–8.0)

## 2010-11-26 LAB — COMPREHENSIVE METABOLIC PANEL
ALT: 23 U/L (ref 0–35)
AST: 19 U/L (ref 0–37)
Albumin: 4 g/dL (ref 3.5–5.2)
Alkaline Phosphatase: 71 U/L (ref 39–117)
BUN: 22 mg/dL (ref 6–23)
CO2: 27 mEq/L (ref 19–32)
Calcium: 9.2 mg/dL (ref 8.4–10.5)
Chloride: 104 mEq/L (ref 96–112)
Creatinine, Ser: 0.88 mg/dL (ref 0.4–1.2)
GFR calc Af Amer: >60 mL/min (ref >60)
GFR calc non Af Amer: >60 mL/min (ref >60)
Glucose, Bld: 122 mg/dL — ABNORMAL HIGH (ref 70–99)
Potassium: 4.3 mEq/L (ref 3.5–5.1)
Sodium: 139 mEq/L (ref 135–145)
Total Bilirubin: 0.5 mg/dL (ref 0.3–1.2)
Total Protein: 6.9 g/dL (ref 6.0–8.3)

## 2010-11-26 LAB — DIFFERENTIAL
Basophils Absolute: 0 10*3/uL (ref 0.0–0.1)
Basophils Relative: 1 % (ref 0–1)
Eosinophils Absolute: 0.1 10*3/uL (ref 0.0–0.7)
Eosinophils Relative: 1 % (ref 0–5)
Lymphocytes Relative: 24 % (ref 12–46)
Lymphs Abs: 1.5 10*3/uL (ref 0.7–4.0)
Monocytes Absolute: 0.5 10*3/uL (ref 0.1–1.0)
Monocytes Relative: 8 % (ref 3–12)
Neutro Abs: 4.1 10*3/uL (ref 1.7–7.7)
Neutrophils Relative %: 66 % (ref 43–77)

## 2010-11-26 LAB — TYPE AND SCREEN
ABO/RH(D): A POS
Antibody Screen: NEGATIVE

## 2010-11-26 LAB — CBC
HCT: 36 % (ref 36.0–46.0)
Hemoglobin: 11.8 g/dL — ABNORMAL LOW (ref 12.0–15.0)
MCHC: 32.8 g/dL (ref 30.0–36.0)
MCV: 95.7 fL (ref 78.0–100.0)
Platelets: 233 10*3/uL (ref 150–400)
RBC: 3.77 MIL/uL — ABNORMAL LOW (ref 3.87–5.11)
RDW: 12.6 % (ref 11.5–15.5)
WBC: 6.2 10*3/uL (ref 4.0–10.5)

## 2010-11-26 LAB — URINE MICROSCOPIC-ADD ON

## 2010-11-26 LAB — ABO/RH: ABO/RH(D): A POS

## 2011-01-16 NOTE — H&P (Signed)
NAMEROY, SNUFFER                ACCOUNT NO.:  [id number]   MEDICAL RECORD NO.:  [id number]          PATIENT TYPE:  INP   LOCATION:  6711                         FACILITY:  [facility]   PHYSICIAN:  Shirley Friar, MDDATE OF BIRTH:  03-Apr-1946   DATE OF ADMISSION:  08/07/2007  DATE OF DISCHARGE:                              HISTORY & PHYSICAL   ADMISSION DIAGNOSES:  1. Dehydration.  2. High ostomy output.  3. Crohn's disease.   HISTORY OF PRESENT ILLNESS:  Ms. Lukach is a 65 year old white female  who developed a large bowel obstruction in early November thought to be  due to worsened colonic stricture from her Crohn's disease.  She  underwent a subtotal colectomy with ileostomy placement on July 08, 2007.  Since discharge on July 16, 2007, she reports increased  ostomy output and needing to empty her ostomy bag several times a day  and having to change her bag every day.  She says that every day, her  ostomy bag leaks causing redness of her abdominal skin and breakdown of  the skin area.  She comes in today for follow up with me and complains  of severe weakness and fatigue.  She is hypotensive with blood pressure  80/62.  On evaluation of her history and discussion with her, a decision  is made to admit her for rehydration and management of her high ostomy  output.   CURRENT MEDICATIONS:  1. Asacol 400 mg tabs 3 pills twice a day.  2. Lisinopril/HCTZ daily.  3. Protonix 40 mg daily.  4. Iron sulfate 1 daily.  5. Calcium 600 mg daily.  6. Vitamin E.  7. Insulin.  8. Lantus.  9. Folic acid.   ADMITTING MEDICINES:  1. Asacol 400 mg 3 tablets twice a day.  2. Sliding-scale insulin.   PAST MEDICAL HISTORY:  1. Type 2 diabetes.  2. Tubal ligation.  3. Hypertension.  4. Crohn's as stated above.  5. Hyperlipidemia.  6. Osteopenia.  7. Tobacco abuse.   PHYSICAL EXAMINATION:  VITAL SIGNS:  Temperature 97.1, pulse 70, blood  pressure 80/62, weight 142 pounds.  GENERAL:  Lethargic, no acute distress.  ABDOMEN:  Erythema around the ostomy bag which is intact, soft,  nontender.   IMPRESSION:  A 65 year old white female with dehydration, likely due to  high ostomy output following her subtotal colectomy.  We will admit her  to the hospital for rehydration, follow her electrolytes and consult  surgery.  I talked to Dr. Derrell Lolling prior to admission.  He is aware of her  admission.  We will have the surgeon on call from [facility] be aware of  her admission.  I will also get ostomy nurse to come in and see if we  can help get a better fit for her ostomy site.  We will put her on a  clear liquid diet and check abdominal x-ray.  If abdominal x-ray is  negative for any obstructive features, we will put her on Imodium as  needed.      Shirley Friar, MD  Electronically Signed  VCS/MEDQ  D:  08/07/2007  T:  08/07/2007  Job:  [id number]   cc:   Angelia Mould. Derrell Lolling, M.D.

## 2011-01-16 NOTE — Discharge Summary (Signed)
[name], [name]                ACCOUNT NO.:  [id number]   MEDICAL RECORD NO.:  [id number]          PATIENT TYPE:  INP   LOCATION:  6711                         FACILITY:  [facility]   PHYSICIAN:  Shirley Friar, MDDATE OF BIRTH:  [birth year]   DATE OF ADMISSION:  08/07/2007  DATE OF DISCHARGE:  08/11/2007                               DISCHARGE SUMMARY   ADMIT DIAGNOSES:  1. Dehydration.  2. High ostomy output.  3. History of Crohn's disease.  4. History of hypertension.  5. History of tight type 2 diabetes.   DISCHARGE DIAGNOSES:  1. High ostomy output resolved.  2. History of current disease.  3. History of hypertension.  4. History of type 2 diabetes.   ATTENDING PHYSICIAN:  Shirley Friar, M.D. with gastroenterology.   CONSULTATIONS:  1. [facility].  2. Wound care.  3. Nutrition.   HISTORY:  This is a 65 year old female who unfortunately in early  November had a large bowel obstruction that was worsened due to a  possible Crohn's stricture.  She underwent a subtotal colectomy done by  Dr. Delane Ginger.  She presented to [facility] gastroenterology on August 07, 2007 to see Dr. Charlott Rakes in follow-up.  She was complaining  of severe weakness and fatigue.  He found her to be hypotensive with a  blood pressure of 80/62.  She also stated that she had increased ostomy  output, needing to empty her ostomy bag several times a day every day.  She said the ostomy bag was leaking and causing her abdominal skin to  break down.   HOSPITAL COURSE:  She was admitted and placed on IV fluids.  Her  electrolytes were corrected, and she was started on clear liquids.  Her  diet was gradually advanced.  Her output remained high for about 2 days  and then started to slow with the help of Imodium and Kaopectate.  The  patient was seen by [facility] who will continue to see  her in follow-up.  She was also seen by wound care who educated her on  techniques to help her change her ostomy bag and reduced any leakage.  Further, she was seen by nutrition.  On August 11, 2007, her day of  discharge, the patient reported that she felt 500% better and wanted  to go home.  Her ileostomy output was much improved and had decreased  from a high of approximately 1200 cc to now 300 cc per day.  Hemoglobin  is 8.7, hematocrit 25.8, white count 5.9, platelets 225,000.  BMET is  within normal limits with a BUN of 7, creatinine of 0.8.  Her vital  signs today are a temperature of 99, pulse 61, respirations 20.  Blood  pressure is much improved at 26/75.  Stool cultures - Clostridium  difficile was negative.  Routine stool culture for enteric pathogens;  the preliminary was negative.  Fecal lactoferrin is negative.  Giardia  and Cryptosporidium are still outstanding.   ASSESSMENT:  The patient looks well.  She is eating solid foods.  Her  ostomy output is  approximately 300 cc.   DISPOSITION:  Will discharge to home in good condition.   FOLLOW-UP PLAN:  She has a follow-up appointment with [facility] on December 19, and she has a follow-up appointment with [facility]  gastroenterology with Dr. Bosie Clos on August 25, 2007 at 1:30.   DISCHARGE MEDICATIONS:  1. Imodium 2 mg orally q.6h. for 1 week.  2. Kaopectate as needed per the directions on the packaging,  Also, she will resume her home medications which include:  1. Protonix 40 mg daily.  2. Folic acid 1 mg daily.  3. Vitamin D.  4. Vitamin E.  5. Multivitamin.  6. Calcium.  7. Humalog 35 units at bedtime.  8. Lisinopril/HCTZ 10/12.5 mg daily.  9. Asacol 400 mg 3 tablets b.i.d.      Stephani Police, PA      Shirley Friar, MD  Electronically Signed    MLY/MEDQ  D:  08/11/2007  T:  08/11/2007  Job:  [id number]   cc:   Angelia Mould. Derrell Lolling, M.D.

## 2011-01-16 NOTE — Op Note (Signed)
[name], [name]                ACCOUNT NO.:  [id number]   MEDICAL RECORD NO.:  [id number]          PATIENT TYPE:  INP   LOCATION:  0003                         FACILITY:  [facility]   PHYSICIAN:  Angelia Mould. Derrell Lolling, M.D.DATE OF BIRTH:  08/27/1946   DATE OF PROCEDURE:  02/26/2008  DATE OF DISCHARGE:                               OPERATIVE REPORT   PREOPERATIVE DIAGNOSIS:  Crohn's colitis, status post subtotal colectomy  with ileostomy.   POSTOPERATIVE DIAGNOSIS:  Crohn's colitis, status post subtotal  colectomy with ileostomy.   OPERATION PERFORMED:  Resection and closure of ileostomy.   SURGEON:  Angelia Mould. Derrell Lolling, M.D.   FIRST ASSISTANT:  Anselm Pancoast. Zachery Dakins, M.D.   OPERATIVE INDICATIONS:  This is a 65 year old white female who has  Crohn's colitis.  She presented with a complete obstruction of her  distal transverse colon due to Crohn's stricture in November 2008.  She  underwent emergent colectomy with resection of the right colon,  transverse colon, splenic flexure and proximal descending colon and  Brooke ileostomy in November 2008.  She has recovered from that surgery  and is off of steroids and is back to work full-time and very  functional.  She has had a colonoscopy and an ileoscopy by Dr. Bosie Clos.  He found a few pseudopolyps in the sigmoid colon but no cancer and  actually  it looked quite good.  The terminal ileum looked normal.  He  felt that it would be reasonable to take down her ileostomy.  She took  some enemas at home last night and is brought to operating room  electively this morning.   OPERATIVE FINDINGS:  The patient had extensive intra-abdominal adhesions  but they were soft and filmy and we were able to take all the adhesions  down without much complication.  We found the colon transection area in  the left paracolic gutter just above the anterior superior iliac spine.  There was some pasty mucoid material in the colon.  The ileum looked  healthy.  I  felt no other abnormality within the colon.   OPERATIVE TECHNIQUE:  Following the induction of general endotracheal  anesthesia, the patient was identified as correct patient and correct  procedure.  Foley catheter was inserted.  A pursestring suture of 2-0  silk was placed to close the ileostomy down.  The abdomen was prepped  and draped in sterile fashion.  Intravenous antibiotics were given prior  to the incision.  Midline laparotomy incision was made through the  previous scar.  The fascia was incised.  We carefully entered the  abdomen and found to free space.  We then slowly pushed all of the  adhesions away from the abdominal wall and opened up the fascia  completely.   We ultimately took all of the small bowel adhesions down so that we  could mobilize the small bowel up out of the pelvis.  We examined the  distal descending colon and it looked healthy.  We mobilized the distal  descending colon and proximal sigmoid colon by dividing the lateral  peritoneal attachments and mobilizing the colon to  the midline.   We then made an incision around the ileostomy and dissected the  ileostomy away from the subcutaneous tissue and the muscle fascia.  We  returned the terminal ileum and ileostomy to the abdominal cavity, took  all the rest of the adhesions down.   We transected about the last 6 inches of terminal ileum with the  ileostomy using a GIA stapling device and sent that as a specimen.  The  rest of the small bowel looked normal and small bowel mesentery was  healthy.  We irrigated out the abdominal pelvic cavities.  There was no  bleeding.   We set up the anastomosis after packing all the small bowel away in a  cephalad position.  We placed the terminal ileum and the distal  descending colon side-by-side, set up the anastomosis and created an  anastomosis using GIA stapling device.  We inspected the lumen, both  proximal distal limbs of bowel were pink and healthy and viable.   We  irrigated this out and got a little bit of the pasty mucoid material  out.  We closed the defect in the bowel wall with TA-60 stapling device.  A few extra 3-0 silk sutures were placed along the staple line in  critical positions.   We then changed our gloves and instruments.  We irrigated out the  abdomen and pelvis with several liters of saline.  We were able to close  down the mesenteric defect inferiorly with interrupted sutures of 3-0  silk.  We inspected the anastomosis one more time and it looked fine.  We returned the small bowel to its anatomic position.   We closed the fascia at the ileostomy defect with about six interrupted  sutures of #1 Novofil.  We closed the midline fascia with a running  double-stranded #1 PDS suture.  We irrigated out all of the wounds.  We  stapled all the wounds closed with skin staples.  We placed a Telfa wick  as a drain in the ileostomy site.   The patient tolerated the procedure well and was taken to the recovery  room in stable condition.  Clean bandages were placed.   ESTIMATED BLOOD LOSS:  About 150-200 mL.   COMPLICATIONS:  None.   Sponge, needle and instrument counts were correct.      Angelia Mould. Derrell Lolling, M.D.  Electronically Signed     HMI/MEDQ  D:  02/26/2008  T:  02/26/2008  Job:  [id number]   cc:   Shirley Friar, MD  Fax: [phone]   Deirdre Peer. Polite, M.D.

## 2011-01-16 NOTE — H&P (Signed)
NAMEMELANNIE, METZNER NO.:  [id number]   MEDICAL RECORD NO.:  [id number]          PATIENT TYPE:  INP   LOCATION:  2316                         FACILITY:  [facility]   PHYSICIAN:  Danise Edge, M.D.   DATE OF BIRTH:  03-12-1946   DATE OF ADMISSION:  07/07/2007  DATE OF DISCHARGE:                              HISTORY & PHYSICAL   TIME OF ADMISSION:  11:30 p.m.   ADMISSION DIAGNOSIS:  Right colonic ischemia and small bowel  obstruction, rule out mesenteric ischemia.   HISTORY:  [name] is a 65 year old female born July 21, 1946.  [name] is followed by Dr. Darl Pikes __________ .  [name]  has chronic Crohn's colitis diagnosed by colonoscopies performed in 2004  and in 2007.  She has not required prednisone for approximately 1 year.  She currently takes Asacol 3 times a day.   [name] has had progressively worsening abdominal pain and nausea  for 5 days unassociated with vomiting or diarrhea.  The severity of her  abdominal pain and abdominal distention prompted evaluation in the [facility] Emergency Room this evening.  Her lipase was normal.  Her complete  metabolic profile was abnormal for an elevated serum creatinine 2.0 and  elevated glucose 140.  Her CBC revealed a white blood cell count 24,100  and hemoglobin 11.7 grams.  Diagnostic CT scan of the abdomen and pelvis  revealed pneumatosis of the right colon and a dilated small bowel.   The patient's pain intensity has diminished after receiving 12 mg of  morphine.  Upon arrival in the emergency room, the nurses caring for her  reported tense abdominal distention; after placement of a nasogastric  tube, 2 liters of feculent return was obtained.   MEDICATION ALLERGIES:  CODEINE.   CHRONIC MEDICATIONS:  1. Asacol 400 mg 3 times daily.  2. Lisinopril/HCT 10/12.5 mg daily.  3. Protonix 40 mg daily.  4. Lantus insulin 38 units at bedtime.  5. Humalog insulin 3-5 units before meals.  6.  Calcium with vitamin-D.  7. Multivitamin.  8. Iron 65 mg.  9. Folic acid 1 mg daily.   PAST MEDICAL/SURGICAL HISTORY:  1. Type 2 diabetes mellitus for approximately 18 years.  2. Chronic cigarette smoker.  3. Tubal ligation.  4. Bladder suspension surgery.  5. Hypertension.  6. Remote alcoholism.  7. Colonoscopies performed in 2004 and 2007 were consistent with      Crohn's colitis.  8. Esophagogastroduodenoscopy performed in 2004 (results unknown).  9. Hyperlipidemia.  10.Osteopenia.  11.Chronic Crohn's colitis.  12.Right Baker's cyst.   HABITS:  [name] smokes cigarettes.  She has a history of remote  alcoholism.   FAMILY HISTORY:  Noncontributory.   PHYSICAL EXAMINATION:  VITAL SIGNS:  Temperature 97.8 degrees, blood  pressure 104/67, pulse 83.  GENERAL APPEARANCE:  [name] is alert, despite receiving 12 mg of  intravenous morphine.  The nasogastric tube is in place.  HEENT:  Sclera nonicteric.  CARDIAC:  Reveals a regular rhythm.  No audible murmurs.  LUNGS:  Clear to auscultation.  ABDOMEN:  Distended  and hard to feel.  Absent bowel sounds.  Painful to  palpation predominantly in the right abdomen.  SKIN:  Warm and dry.   ASSESSMENT:  1. Right colonic pneumatosis and dilated small bowel, rule out      mesenteric ischemia.  2. Chronic Crohn's colitis.  3. Type 2 diabetes mellitus.  4. Hypertension.  5. Chronic cigarette smoker and remote alcoholism.   PLAN:  1. Surgery was consulted.  2. Intravenous Zosyn will be started.  3. The patient will remain on nasogastric suction.  4. Intravenous saline will be used for volume replacement.           ______________________________  Danise Edge, M.D.     MJ/MEDQ  D:  07/07/2007  T:  07/08/2007  Job:  [id number]

## 2011-01-16 NOTE — Op Note (Signed)
[name], [name]                ACCOUNT NO.:  [id number]   MEDICAL RECORD NO.:  [id number]          PATIENT TYPE:  INP   LOCATION:  2316                         FACILITY:  [facility]   PHYSICIAN:  Angelia Mould. Derrell Lolling, M.D.DATE OF BIRTH:  [birth year]   DATE OF PROCEDURE:  07/08/2007  DATE OF DISCHARGE:                               OPERATIVE REPORT   PREOPERATIVE DIAGNOSIS:  Large and small bowel obstruction secondary to  Crohn's colitis with stricture of transverse colon.   POSTOPERATIVE DIAGNOSIS:  Large and small bowel obstruction secondary to  Crohn's colitis with stricture of transverse colon.   OPERATION PERFORMED:  Exploratory laparotomy, subtotal colectomy with  takedown of splenic flexure, ileostomy.   SURGEON:  Ernestene Mention, MD   FIRST ASSISTANT:  Ardeth Sportsman, MD   OPERATIVE INDICATIONS:  This is a 65 year old white female with multiple  medical problems.  She is known to have Crohn's colitis and has been  treated for that in the past and has undergone colonoscopy in the past  showing patchy diffuse disease in the colon but sparing of the small  bowel.  She was admitted early this morning by Dr. Danise Edge with  acute abdominal pain, abdominal distention, nausea and vomiting.  Her  exam reveals a distended abdomen which is somewhat tender but became  somewhat less tender when a nasogastric tube was placed.  CT scan shows  a massively distended right colon and transverse colon with thickening  at the left transverse colon and apparent stricture at that point and  chronic thickening of the proximal descending colon but collapse of the  descending colon and sigmoid colon.  There is no sign of perforation or  abscess at this time.  Her white blood cell count is 24,000.  It is felt  that she has an acute large bowel obstruction and is brought to the  operating room urgently.   OPERATIVE FINDINGS:  The left transverse colon was markedly thickened  and bumpy and was  felt to be at the point of obstruction with  significant distention of the right colon proximal to this and collapse  of the left colon.  The Crohn's disease appeared to extend across the  splenic flexure and down into the descending colon somewhat but once I  got down into the mid to distal descending colon the colon became quite  normal and that was the level of resection.  The sigmoid colon and  rectum felt soft.  The small bowel was distended but there were no signs  of any active Crohn's disease.  There were some benign ascites present  but no odor or sign of perforation.  The gallbladder was soft but  distended.  I did not palpate any stones in the gallbladder.  The liver  felt normal.   OPERATIVE TECHNIQUE:  Following the induction of general endotracheal  anesthesia the patient was identified as correct patient and correct  procedure.  The abdomen was prepped and draped in sterile fashion.  Midline laparotomy incision was made.  The abdomen centered and explored  with findings as described above.  I mobilized the terminal ileum and right colon and hepatic flexure by  dividing the lateral peritoneal attachments.  I used the electrocautery  as well as the LigaSure for this.  I transected the terminal ileum about  3 inches proximal to the ileocecal valve using a GIA stapling device.  I  used the LigaSure device to divide the right colon and hepatic flexure  mesentery.  The ileocolic vessels were large and I chose to ligate these  with 2-0 silk ties.  I carried the dissection from right to left  dividing the gastrohepatic omentum with the LigaSure.  The greater  omentum was chronically inflamed and foreshortened and had to be  sacrificed.  I mobilized the descending colon by dividing its lateral  peritoneal attachments.  I then took that dissection proximally  mobilizing the splenic flexure up and around.  I then completed all of  the division of the gastrohepatic omentum with the  LigaSure device.  At  that point I had the transverse colon mesentery isolated and then I  divided it using the LigaSure device.  There was one large mesenteric  vessel that I clamped and ligated with a 2-0 silk tie.  I transected the  mid to distal descending colon with a GIA stapling device.  I removed  the specimen.  The stapled off stump of the distal descending colon was  marked with two sutures of 2-0 Prolene.   At this point I irrigated out the abdomen and pelvis with about 6 liters  of saline.  There was nothing that looked infected.  There was no  bleeding.  All of the areas of dissection were perfectly dry.  The ileum  was quite pink and healthy.  I brought out the ileostomy in the right  lower quadrant, actually somewhat above the umbilicus because of her  abdominal distention.  I cut out a circular button of skin, debrided the  fat.  I incised the anterior rectus sheath on the right with the cautery  in a cruciate fashion.  I then spread the muscles apart and then divided  the posterior rectus sheath and then dilated the tract to two  fingerbreadths.  I then positioned the terminal ileum so that it lay  quite nicely across this area and brought it out through the abdominal  wall and positioned it so that it would not twist.  I checked the  abdomen one more time and there was no bleeding.  All the fluid was  removed.   I placed two large pieces of Seprafilm on top of the small bowel.  The  midline fascia was closed with running suture of double-stranded #1 PDS.  Skin staples were placed at intervals and Telfa wicks were placed in  between the skin staples.  The ileostomy was then matured with about 10  interrupted sutures of 3-0 Vicryl.  The ileostomy was pink, bled easily  and was quite viable.  I was able to pass my finger easily through the  abdominal fascia.  Ileostomy bag was placed.  Clean bandages placed.  The patient tolerated the procedure well and was taken to the  recovery  room in stable condition.   ESTIMATED BLOOD LOSS:  Was about 300-400 mL.   COMPLICATIONS:  None.   COUNTS:  Sponge, needle and instrument counts were correct.      Angelia Mould. Derrell Lolling, M.D.  Electronically Signed     HMI/MEDQ  D:  07/08/2007  T:  07/09/2007  Job:  [id number]  cc:   Shirley Friar, MD  Danise Edge, M.D.

## 2011-01-16 NOTE — Consult Note (Signed)
[name], [name]                ACCOUNT NO.:  [id number]   MEDICAL RECORD NO.:  [id number]          PATIENT TYPE:  INP   LOCATION:  2550                         FACILITY:  [facility]   PHYSICIAN:  Maisie Fus A. Cornett, M.D.DATE OF BIRTH:  01/03/1946   DATE OF CONSULTATION:  07/07/2007  DATE OF DISCHARGE:                                 CONSULTATION   PHYSICIAN REQUESTING CONSULTATION:  Shirley Friar, M.D.   REASON FOR CONSULTATION:  Colonic obstruction, dilated right colon  pneumatosis, history of Crohn disease.   HISTORY OF PRESENT ILLNESS:  The patient is a pleasant 65 year old  female with a 6-year history of Crohn's disease.  She has chief  complaint of diffuse abdominal pain starting last Wednesday.  She was  seen Friday by Dr. Bosie Clos and told she had a viral infection.  She  returns to the emergency room today with increasing abdominal pain,  nausea, and vomiting.  She has had one bowel movement over the last 5  days.  She is maintained on Asacol for her Crohn's disease diagnosed 6  years ago.  She was seen in the emergency department.  An NG tube was  placed.  CT scan revealed a significantly dilated right colon, cecum,  and transverse colon to the distal transverse colon where there appears  to be a stricture which was present on November 23, 2006 by CT studies.  She also small-bowel obstruction secondary to this.  I was asked to see  her in consultation for this.  There is also a concern for possible  pneumatosis on CT scan.   She has no abdominal pain currently.  She feels much better since a  gastric tube was placed.  Her pain is well-controlled.  She has received  doses of narcotics.  Pain is located diffusely when it was present,  associated with nausea and vomiting, with no radiation.  Made better  with nasogastric tube.   PAST MEDICAL HISTORY:  1. Crohn's disease.  2. Type 2 diabetes mellitus.  3. Hypertension.  4. Obesity.  5. Gastroesophageal reflux  disease.   PAST SURGICAL HISTORY:  Denies any previous abdominal surgery.   ALLERGIES TO MEDICATIONS:  CODEINE.   MEDICATIONS:  Asacol, insulin, Lantus, lisinopril, Protonix, and folic  acid.   SOCIAL HISTORY:  Denies tobacco or alcohol use.   REVIEW OF SYSTEMS:  As stated above, x15 and otherwise negative.   FAMILY HISTORY:  Noncontributory.   PHYSICAL EXAMINATION:  VITAL SIGNS:  Temperature 97.2, pulse 83, blood  pressure 104/67, respiratory rate 16.  GENERAL APPEARANCE:  Pleasant female resting comfortably in no apparent  distress.  HEENT:  Nasogastric tube in place.  Oropharynx is dry.  NECK:  Supple, nontender.  Trachea midline.  CHEST:  Clear to auscultation with mild wheezing.  Chest wall motion is  normal.  CARDIOVASCULAR:  Regular rate rhythm without rub, murmur, or gallop.  No  tachycardia.  EXTREMITIES:  Warm and well-perfused.  ABDOMEN:  Distended, tympanitic, no bowel sounds.  Minimally tender to  palpation and percussion.  No obvious rebound or guarding on examination  currently.  EXTREMITIES:  No clubbing, cyanosis, nor edema.  NEUROLOGIC:  Glasgow coma scale is 15, otherwise nonfocal.   DIAGNOSTIC STUDIES:  Abdominal and pelvic CT scan, as stated above.  She  does have pericolonic inflammation at the transverse colon where a  previous stricture has been documented.  She does have significant  distention in the cecum and ascending colon with questionable  pneumatosis, even though it is hard for me to tell because the colonic  wall is not grossly thickened and there is no evidence of ascites or  free air on CT scanning.  No obvious abscess.   White count 24,000, hemoglobin 11,700, platelet count 658,000.  Sodium  139, potassium 4.5, chloride 103, CO2 26, BUN 30, creatinine 2.20,  glucose 141.  Lipase 13.   IMPRESSION:  1. History of Crohn's disease with transverse colonic stricture and      large bowel and small-bowel obstruction secondary to this.  2.  Questionable pneumatosis of the right colon, although clinically      does not show signs of peritonitis with rebound or guarding.  3. Acute renal failure probably secondary to dehydration.  4. Gastroesophageal reflux disease.  5. Obesity.   PLAN:  I agree with NG tube and IV fluid resuscitation for this evening.  Her electrolytes are within normal limits.  She will more than likely  require exploratory laparotomy at some point, but given the fact that  she does not have an acute abdomen this evening, I recommend NG tube  decompression and IV fluid resuscitation prior to a trip to the  operating room.  She is on Cipro and Flagyl which I feel is reasonable,  and I have discussed all this with the patient.  Thank you for this  consultation.      Thomas A. Cornett, M.D.  Electronically Signed     TAC/MEDQ  D:  07/07/2007  T:  07/08/2007  Job:  [id number]

## 2011-01-16 NOTE — Consult Note (Signed)
[name], [name]                ACCOUNT NO.:  [id number]   MEDICAL RECORD NO.:  [id number]          PATIENT TYPE:  INP   LOCATION:  1226                         [facility]ACILITY:  [facility]   PHYSICIAN:  Shirley Friar, MDDATE OF BIRTH:  [birth year]   DATE OF CONSULTATION:  DATE OF DISCHARGE:                                 CONSULTATION   REQUESTING PHYSICIAN:  Ramiro Harvest, MD.   REASON FOR CONSULTATION:  Anemia, increased ostomy output.   HISTORY OF PRESENT ILLNESS:  A 65 year old white female, history of  Crohn's colitis, who is status post recent subtotal colectomy with  ileostomy placement.  She presented with weakness, hypotension and was  found to have acute renal failure with a potassium of 7.5 and a serum  creatinine of 5.4 on admission.  She was anemic on presentation with a  hemoglobin of 11.8, which has dropped down to 8.9.  Potassium has  decreased to 3.2 and her creatinine has decreased down to 1.87.  Her  ostomy output is also improved with 100 mL total taken out today  compared to 400 mL at 6:00 p.m. last night.   PAST MEDICAL HISTORY:  Diabetes, hypertension, COPD, osteoporosis,  asthma, GERD, chronic anemia.   MEDICATIONS:  See chart.   PHYSICAL EXAMINATION:  VITAL SIGNS:  Temperature 99.1, pulse 77, blood  pressure 113/61.  GENERAL:  Sitting in a chair, alert, in no acute distress.  ABDOMEN:  Ostomy clean, dry and intact, healed midline surgical  incision scar.  Soft, nontender, nondistended.  Positive bowel sounds.   LABORATORIES:  Hemoglobin 8.9, platelet count 232, white blood count  5.1, BUN 62, creatinine 1.87, lipase 42.   IMPRESSION:  A 65 year old white female with Crohn's colitis, status  post recent subtotal colectomy, who was admitted for dehydration,  possibly due to the combination of her medicines in conjunction with her  increased ostomy output.  I do not think it is related to Clostridium  difficile  enteritis.  Agree with supportive care  with IV fluids.  It  should be okay to resume outpatient Imodium as needed as she has  previously been on.  Dr. Derrell Lolling was planning on doing a takedown of her  ileostomy in February pending upcoming colonoscopy.  This colonoscopy  has already been scheduled for late January and would recommend keeping  this date.  No indication to do an inpatient colonoscopy based on her  anemia.  She is heme negative x1.   Would recommend continued supportive care, medicine changes per renal.  If she develops visible rectal bleeding or becomes heme-positive may  need to readdress inpatient colonoscopy.  The patient and daughter are  also concerned about her Asacol, which they see as being intact in her  ostomy.  May need to adjust her to Pentasa depending on  her upcoming colonoscopy.  When she leaves the hospital this time would  recommend continuing the Asacol and will readdress whether to change her  to Pentasa or other mesalamine product pending her upcoming colonoscopy.   Thank you for this consultation.      Shirley Friar, MD  Electronically Signed     VCS/MEDQ  D:  09/11/2007  T:  09/11/2007  Job:  [id number]   cc:   Dyke Maes, M.D.  Fax: [phone]   Angelia Mould. Derrell Lolling, M.D.  [address], [address]  [place]  [place] [address]   Ramiro Harvest, MD

## 2011-01-16 NOTE — Consult Note (Signed)
[name], [name]                ACCOUNT NO.:  [id number]   MEDICAL RECORD NO.:  [id number]          PATIENT TYPE:  INP   LOCATION:  1226                         [facility]ACILITY:  [facility]   PHYSICIAN:  Dyke Maes, M.D.DATE OF BIRTH:  Jul 30, 1946   DATE OF CONSULTATION:  DATE OF DISCHARGE:                                 CONSULTATION   REASON FOR CONSULTATION:  Acute renal failure.   HISTORY OF PRESENT ILLNESS:  A 65 year old white female who presented to  the [facility] emergency room today with weakness and hypotension.  She  was found to have systolic blood pressure in the 70s.  She has a 3-day  history of nausea and vomiting, poor p.o. intake and increased ileostomy  output.  She recently had subtotal colectomy and placement of ileostomy  in November 2008.  At the beginning of December, she was admitted for  dehydration, though her serum creatinine at that time was 0.8.  On  admission, her serum creatinine is 5.4, although with some IV fluids,  her serum creatinine has improved a little bit to 4.6.  Potassium was  7.5 on admission though it has improved to 5.3.  Of note, is the fact  that she was on lisinopril prior to admission.   PAST MEDICAL HISTORY:  1. Diabetes times 18 years.  2. Hypertension.  3  Crohn's status post ileostomy on September 06, 2006.   ALLERGIES:  None.   MEDICATIONS:  Prior to admission include folic acid, Protonix,  lisinopril, hydrochlorothiazide, Lantus and calcium.   SOCIAL HISTORY:  Two pack per day smoker for 30-40 years, and she  continues to smoke.  Denies alcohol use.  She is married.   FAMILY HISTORY:  Mother died of stroke.  Father had diabetes and died of  acute MI.  No family history of renal disease.   REVIEW OF SYSTEMS:  GI symptoms as noted above.  Denies any abdominal  pain.  No shortness of breath.  No chest pains.  Currently, she is  feeling better than when she initially presented in terms of her  weakness, though she still  feels somewhat weak.  No dysuria.  Rest of  review of systems unremarkable.   PHYSICAL EXAMINATION:  VITAL SIGNS:  Blood pressure is 92/54, pulse of  110, temperature 97.3.  GENERAL:  A 65 year old white female in mild distress secondary to her  low blood pressure.  She is awake and alert.  LUNGS:  Clear to auscultation.  HEART:  Tachycardic but regular rate and rhythm without murmur or  gallop.  ABDOMEN:  Positive bowel sounds, nontender, nondistended.  There is  ileostomy in the right lower quadrant.  There is some erythema related  to take irritation over her mid abdomen.  EXTREMITIES:  No clubbing, cyanosis or edema.  Pulses 2/4, the carotids  reveals femorals, dorsalis pedis and posterior tibial.  NEUROLOGIC:  Cranial nerves intact.  Motor and sensory intact.  Oriented  x3.  No asterixis.   LABORATORY:  Sodium 139, potassium 5.3, bicarb 12, BUN 99, creatinine  4.6, calcium 8.2, hemoglobin 11.8, white count 9.8, platelet  count  336,000.  Urinalysis showed 3 mg protein, otherwise benign.   IMPRESSION:  1. Acute renal failure secondary to volume depletion and ACE      inhibitors.  2. Hyperkalemia secondary to acute renal failure and ACE inhibitor.  3. Crohn's.  4. Hypotension secondary volume depletion.  5. Metabolic acidosis.   PLAN:  I agree with IV hydration, but will also add an IV bicarb drip.  Overall, will continue daily check of renal function.  She is making  urine now, and blood pressure is better and I suspect renal function  should improve rapidly over the next few days.  Of note is the fact I  would avoid ACE inhibitors and ARB use for the time being as she is at  high risk of this happening again.  I would use a dihydropyridine  calcium channel blocker such as Norvasc as needed for blood pressure  control at a later date.  Certainly, an ACE inhibitor or ARB can be used  down the road if her condition stabilizes from dehydration risk.   Thank you very much for  the consult.  Will follow the patient with you.           ______________________________  Dyke Maes, M.D.     MTM/MEDQ  D:  09/10/2007  T:  09/11/2007  Job:  [id number]

## 2011-01-16 NOTE — H&P (Signed)
[name], [name] NO.:  [id number]   MEDICAL RECORD NO.:  [id number]          PATIENT TYPE:  INP   LOCATION:  0107                         FACILITY:  [facility]   PHYSICIAN:  Ramiro Harvest, MD    DATE OF BIRTH:  09-05-1945   DATE OF ADMISSION:  09/10/2007  DATE OF DISCHARGE:                              HISTORY & PHYSICAL   PRIMARY CARE PHYSICIAN:  Dr. Linward Natal of [facility] .   GASTROENTEROLOGIST:  Shirley Friar, MD.   SURGEONAngelia Mould. Derrell Lolling, M.D.   HISTORY OF PRESENT ILLNESS:  She has had vomiting, decreased p.o. intake  and dizziness.  [name] is a 65 year old white female with  history of type 2 diabetes x 18 years, hypertension, Crohn's disease,  status post total colectomy with ileostomy, who was recently discharged  from the hospital August 11, 2007 for dehydration and high ostomy  output, who presented to the ED with a 3 day history of decreased p.o.  intake, emesis, nausea, fatigue, chills, increasing the ostomy output,  decreased urine output and dizziness.  The patient denies any fevers,  chest pain, no shortness of breath, no palpitations.  No visual changes,  no hematemesis, no hematochezia or melena.  No cough, no dysuria, no  headache or abdominal pain.  No other associated symptoms.  The patient  states that she had been taking all her medications as prescribed  including her blood pressure medications.  In the ED, the patient was  found to be hypotensive with systolic blood pressures in the 70s with  acute renal failure with a creatinine of 5.72 and a BUN 119, metabolic  acidosis and hyperkalemia.  The patient was given 3 liters and boluses  of normal saline, given 2 ampules of bicarb, Kayexalate and albuterol.  Renal service was called and they said they would consult and see the  patient later on.  We were called to admit the patient for further  evaluation and management.   ALLERGIES:   NO KNOWN DRUG ALLERGIES.   PAST MEDICAL HISTORY:  1. Type 2 diabetes x 18 years.  2. Hypertension.  3. Right Baker's cyst  4. Crohn's disease, status post ileostomy.  5. Small and large bowel obstruction possibly secondary to Crohn's      strictures, status post subtotal colectomy July 08, 2007 per Dr.      Derrell Lolling now with ileostomy pouch.  6. Status post tubal ligation.  7. Hyperlipidemia.  8. Osteopenia.  9. Tobacco abuse.  10.Status post bladder suspension surgery.  11.History of asthma.  12.Gastroesophageal reflux disease.   MEDICATIONS:  1. Folic acid 1 mg daily.  2. Protonix 40 mg daily.  3. Asacol 400 mg 3 tablets twice a day.  4. Lisinopril/HCTZ 10/12.5 mg daily.  5. Lantus 35 units q.h.s.  6. Regular insulin 3 units t.i.d. a.c.  7. Iron 65 mg daily.  8. Vitamin D.  9. Vitamin E.  10.Multivitamin.  11.Calcium 500 mg daily.  12.Albuterol MDI p.r.n.   SOCIAL HISTORY:  Positive tobacco abuse 60-88 pack year history,  currently  smokes 6 cigarettes per day.  Drinks alcohol just socially.  No IV drug use.   FAMILY HISTORY:  Mother deceased age 103 from CVA.  Father deceased at 28  from a massive MI and also had diabetes.  One sister deceased age 32  from drug overdose.  Brother alive at 71 years old and healthy.   REVIEW OF SYSTEMS:  Positive for lower back pain.  Otherwise as per HPI  and otherwise negative.   PHYSICAL EXAMINATION:  VITAL SIGNS:  Temperature 97.0, blood pressure  75/48, pulse of 98, respiratory rate 20, satting 100%.  GENERAL:  The patient on gurney in no apparent distress.  HEENT:  Normocephalic, atraumatic.  Pupils equal, round and reactive to  light.  Extraocular movements are intact.  Sclera is anicteric.  NECK:  Supple.  No lymphadenopathy.  RESPIRATORY:  Lungs are clear to auscultation bilaterally.  No wheezes,  no crackles, no rhonchi.  CARDIOVASCULAR:  Tachycardiac, regular rhythm.  ABDOMEN:  Soft, nontender, nondistended.  Positive  bowel sounds.  Ileostomy pouch in place.  EXTREMITIES:  No clubbing, cyanosis or edema.  NEUROLOGIC:  The patient is alert and oriented x3.  Cranial nerves II-  XII are grossly intact.  No focal deficit.  Sensation is intact.  Cerebellum is intact.  Gait was not tested secondary to safety issues.   LABORATORY DATA:  ABG:  pH 7.28, PCO2 of 26, PO2 of 110, bicarb of 12,  lipase 42.  CMET:  Sodium 135, potassium 7.5, chloride 115, bicarb 9,  BUN 119, creatinine 5.72, glucose of 167, calcium 9.0, bilirubin 0.9,  alkaline phosphatase 93, AST 11, ALT 13, protein 7, albumin 4.2.  CBC:  White count 9.8, hemoglobin 11.8, platelets 336, ANC of 8.2.   DIAGNOSTICS:  Acute abdominal series preliminary readings:  No acute  findings.   ASSESSMENT:  [name] is a 65 year old pleasant female with  history of Crohn's disease, status post small bowel and large bowel  obstruction secondary to a Crohn's stricture, status post subtotal  colectomy, type 2 diabetes, hypertension, hyperlipidemia, who presents  to the emergency department with emesis x 3 days, decreased p.o. intake,  found to be hypotensive and in acute renal failure problem.  1. Hypotension.  We will admit the patient to the step-down unit.      Differential includes hypovolemia which is the likely etiology      versus cardiogenic, versus septic, versus hemorrhagic, versus renal      insufficiency.  We will cycle cardiac enzymes q.8 h. x3.  Check      blood cultures x2.  Check UA with culture and sensitivities.  Check      a lactic acid.  Check a cortisol level.  Guaiac stools.  Check      stools for enteric pathogens, fecal leukocytes, C. diff,      resuscitate with IV fluids and follow blood pressures.  Watch for      signs of volume overload.  Patient with a negative chest x-ray and      normal white count.  Will hold off on empiric antibiotics at this      time.  Start stress dose steriods.  We will follow and monitor her       closely.Will consult with critical care for central line placement.  2. Acute renal failure.  Baseline creatinine of 0.8 in December 2008,      likely prerenal in the setting of ACE inhibitor use and diuretics  versus renal, versus post-renal.  We will check a fractional      excretion of sodium.  We will check a renal ultrasound on the      patient.  We will hydrate the patient with IV fluids and follow      creatinine.  Renal to see the patient in consultation per ED      doctor.  3. Metabolic acidosis likely secondary to volume contraction.  The      patient already given 2 amps of bicarb in the ED.  We will recheck      a BMET and follow bicarb levels.  We will place the patient on IV      fluids and follow if no improvement, and worsening metabolic      acidosis.  We will add bicarb to IV fluids.  4. Hyperkalemia likely secondary to problem number 2.  The patient was      given Kayexalate and albuterol in the ED.  We will check a BMET.      If the potassium is worsening, we will give some more Kayexalate      and insulin.  Follow for now.  5. Anemia.  Patient with a history of anemia.  We will continue her      home doses of iron and folic acid, and follow CBCs.  The patient's      baseline hemoglobin is about 8.7.  6. High output ileostomy.  We will check electrolytes.  We will check      stool culture for enteric pathogens, leukocytes, C. diff.  We will      get an ostomy nurse from general surgery and GI of patient's      admission.  7. Type 2 diabetes.  Check a hemoglobin A1c,  Lantus 20 units q.h.s.      for now and titrate as needed.  Sliding-scale insulin,  check CBGs.      We will place the patient on a clear liquid diet.  8. Hypertension.  We will hold off on blood pressure medicines.  9. Crohn's disease.  Continue home dose of Asacol.  We will inform GI      of patient's admission.  10.Gastroesophageal reflux disease.  Protonix 40 mg daily.  11.Prophylaxis.  Protonix  for GI prophylaxis.  SCDs for DVT      prophylaxis.   It has been a pleasure taking care of [name].      Ramiro Harvest, MD  Electronically Signed     DT/MEDQ  D:  09/10/2007  T:  09/10/2007  Job:  [id number]   cc:   Shirley Friar, MD  Fax: [phone]   Angelia Mould. Derrell Lolling, M.D.  [address], [address]  [place]  [place] [address]

## 2011-01-16 NOTE — Discharge Summary (Signed)
[name], NEWMAN NO.:  [id number]   MEDICAL RECORD NO.:  [id number]          PATIENT TYPE:  INP   LOCATION:  1226                         [facility]ACILITY:  [facility]   PHYSICIAN:  Ramiro Harvest, MD    DATE OF BIRTH:  1946-05-28   DATE OF ADMISSION:  09/10/2007  DATE OF DISCHARGE:  09/13/2007                               DISCHARGE SUMMARY   ADMITTING PHYSICIAN:  Ramiro Harvest, MD   PRIMARY CARE PHYSICIAN:  Dr. Linward Natal of [facility].   DISCHARGE DIAGNOSES:  1. Hypovolemic hypotension.  2. Acute renal failure.  3. Metabolic acidosis.  4. Hyperkalemia secondary to acute renal failure.  5. Anemia.  6. High output ileostomy.  7. Type 2 diabetes x18 years.  8. Hypertension.  9. Crohn's disease status post ileostomy.  10.Gastroesophageal reflux disease.  11.Small and large bowel obstruction secondary to Crohn's strictures      status post subtotal colectomy July 08, 2007.  12.Hyperlipidemia.  13.Osteopenia.  14.Tobacco abuse.  15.Asthma.  16.Status post bladder suspension surgery.  17.Status post tubal ligation.  18.History of right Baker's cyst.   DISCHARGE MEDICATIONS:  1. Lantus 20 units at bedtime.  2. Norvasc 5 mg p.o. daily.  3. Folic acid 1 mg daily.  4. Sliding scale insulin 3 units a.c.  5. Protonix 40 mg p.o. daily.  6. Iron 65 mg p.o. daily.  7. Vitamin D daily.  8. Vitamin E daily.  9. Multivitamin daily.  10.Calcium 500 mg daily.  11.Albuterol MDI 2 puffs q.4-6 h p.r.n.  12.The patient will be placed on prednisone 60 mg p.o. daily x2 days      then prednisone 40 mg p.o. daily x2 days, prednisone 20 mg p.o.      daily x2 days, prednisone 10 mg p.o. daily x2 days.   DISPOSITION AND FOLLOWUP:  The patient is to schedule a followup  appointment with her primary care physician in 1-2 weeks and at followup  a basic metabolic profile needs to be checked to follow the patient's  potassium levels, renal  function and bicarb levels.  CBC will also need  to be checked to follow up on the patient's hemoglobin/anemia.  The  patient's diabetes needs to be reassessed.  The patient was sent home on  a decreased dose of Lantus 20 units at bedtime.  She used to be on  Lantus 38 units but during the hospitalization blood sugars were in the  50s and on day of discharge were in the 70s.  The patient is also to  keep her followup with GI as scheduled before for colonoscopy on October 03, 2007.  On followup it will be decided per gastroenterologist what  other mesalamine products to place the patient on for Crohn's disease as  this was stopped on discharge per GI recommendations.  The PCP needs to  readdress the patient's hypertension.  The patient was discharged on  Norvasc 5 mg daily.  The patient's ACE inhibitor/diuretic was held  secondary to the patient's acute renal failure and second episode of the  patient going into  hypovolemic shock over the past 1-2 months.  It was  recommended by renal not to restart the patient's ACE inhibitor until  the dehydration has stabilized for a good while and as such Norvasc can  be used and titrated as needed for better blood pressure control.   PROCEDURES PERFORMED:  An acute abdominal series was performed on  September 10, 2007, with no acute findings.  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   CONSULTATIONS:  1. A GI consult was done by Dr. Charlott Rakes on September 11, 2007.  2. A renal consult was done by Dr. Briant Cedar on September 10, 2007.  3. A CCM consult was done for line placement on September 10 2007.      However, a line was never placed.   BRIEF HOSPITAL HISTORY AND PHYSICAL:  [name] is a 65 year old  white female with a history of type 2 diabetes x18 years,  hypertension,  Crohn's disease status post subtotal colectomy with ileostomy, recently  discharged from the hospital August 11, 2007, for dehydration and high  ostomy output who presented to the ED with a 3 day history of decreased  p.o. intake, emesis, nausea, fatigue, chills, increased ostomy output  and decreased urine output and dizziness.  The patient denied any fevers  or chest pain, no shortness of breath, no palpitations, no visual  changes, no hematemesis, no hematochezia, no melena, no cough, no  dysuria, no headache, no abdominal pain.  No other associated symptoms.  The patient had stated on admission that she had been taking all of her  medications as prescribed including her blood pressure medications.  In  the ED the patient was found to be hypotensive with systolic blood  pressures in the 70s, in acute renal failure with a creatinine of 5.72  and a BUN of 119 and a metabolic acidosis and hyperkalemic as well.  The  patient was given 3 liter boluses of normal saline and given 2 ampules  of bicarb in the ED and some Kayexalate and albuterol.  Renal service  was called and said they would consult on the patient and would see the  patient later.  [facility] were called to admit the patient for  further evaluation and management.   PHYSICAL EXAM:  VITAL SIGNS:  Temperature 97.0, blood pressure 75/48,  pulse of 98, respiratory rate 20s, SAO2 of 100%.  GENERAL:  The patient was on the gurney and in no apparent distress.  HEENT:  Normocephalic, atraumatic.  Pupils equal, round and reactive to  light.  Extraocular movements are intact.  Sclerae was anicteric.  NECK:  Neck was supple.  No lymphadenopathy.  RESPIRATORY:  Lungs were clear to auscultation bilaterally.  No wheezes,  no crackles, no rhonchi.  CARDIOVASCULAR:  Tachycardiac, regular rhythm.  ABDOMEN:  Abdomen was soft, nontender, nondistended, positive bowel  sounds.  Ileostomy pouch was in place with loose  stools and full.  EXTREMITIES:  No clubbing, cyanosis or edema.  NEUROLOGICAL:  The patient was alert and oriented x3.  Cranial nerves II-  XII were grossly intact.  Sensation was intact.  Cerebellum was intact.  No focal deficits.  Gait was not tested secondary to safety issues.   LAB DATA ON ADMISSION:  ABG - pH of 7.28, pCO2 of 26, PO2 of 110, bicarb  of 12, lipase  42.  CMET- sodium 135, potassium 7.5, chloride 115, bicarb  9, BUN 119, creatinine 5.72, glucose of 167, calcium of 9.0, bilirubin  0.9, alkaline phosphatase 93, AST 11, ALT 13, protein 7, albumin 4.2.  CBC had a white count of 9.8, hemoglobin 11.8, platelets 336 and ANC of  8.2.  Acute abdominal series preliminary readings on admission showed no  acute findings.   HOSPITAL COURSE:  1. Hypovolemic hypotension.  The patient initially on presentation was      very hypotensive.  The patient was admitted to the ICU unit with      hypotension.  The patient had received about 3 liters of normal      saline in the emergency room however her systolic blood pressures      remained in the 70s.  The patient was placed on more fluids and      eventually placed on dopamine drip starting at 5 mcg per k per hour      to titrate to keep her MAP greater than 65.  Critical care consult      was then obtained for possible line placement as the patient may      need pressors.  The patient was transferred to the ICU and she was      aggressively hydrated at the same time with IV fluids and placed on      a dopamine drip.  The patient's pressures with the drip were      maintained greater than 90.  A bicarb drip was also added to the      patient's IV fluids secondary to her metabolic acidosis.  The      patient remained stable during the hospitalization.  The patient      had no more emesis or nausea during the hospitalization.  The      patient was initially placed on a clear liquid diet and her diet      was advanced.  The patient tolerated  diet well.  The patient's      dopamine was discontinued.  The patient remained off dopamine for      48 hours.  The patient's blood pressures were held with IV fluids.      IV fluids were eventually discontinued.  The patient's blood      pressures remained stable and on the day of discharge were 86,      systolic blood pressures were in the 150s.  The patient felt well      and had no more nausea or emesis during the hospitalization and did      not have any dizziness or weakness.  The patient was back to her      baseline.  The patient was in improved and stable condition.  Blood      cultures, stool cultures were also obtained which came back      negative.  Stools for C. difficile also came back negative.      Cardiac enzymes were cycled to rule out cardiogenic source which      was negative.  EKG was obtained which was also negative.  The      patient did not show any signs of gastrointestinal bleed or      hemorrhage.  The patient remained stable throughout the      hospitalization.  The patient will be discharged in a stable and      improved condition to follow up with her PCP.  The patient's  cortisol level was checked and the patient was placed on stress      dose steroids and those stress dose steroids were tapered down.      The patient will be discharged home on a quick taper of prednisone      to take prednisone 40 mg daily x2 days then 20 mg daily x2 days      then prednisone 10 mg daily x2 days and then off.  2. Acute renal failure.  The patient had come in with acute renal      failure with creatinine as stated above in the admission labs.      Renal was consulted per the ED.  The patient's acute renal failure      was felt to be secondary to problem number 1 which was hypovolemic      hypotension.  The patient was aggressively IV fluid hydrated.  The      patient was also placed on a bicarb drip for her metabolic      acidosis.  The patient's renal function was  followed on a daily      basis.  The patient's renal function improved daily with IV fluid      resuscitation.  A renal ultrasound was also obtained with results      as stated above and on the day of discharge the patient's acute      renal failure was resolved.  The patient's creatinine was 1.2 on      the day of discharge.  The patient was discharged in a stable and      improved condition to follow up with PCP and a followup basic      metabolic profile will need to be checked.  3. Metabolic acidosis.  The patient had presented with a metabolic      acidosis which was thought to be secondary to problem number 1 and      number 2.  The patient's volume was resuscitated aggressively.      Bicarb drip was added to the patient's IV fluids.  The patient's      bicarb normalized and was improved and on the day of discharge the      patient had a bicarb level of 37.  This will need to be followed up      per PCP to make sure it keeps continuing to trend down.  The      patient will be discharged in stable and improved condition.  4. Hyperkalemia.  The patient had come in hyperkalemic which was felt      to be secondary to the patient's acute renal failure.  In the ED      the patient was given some albuterol nebs, given insulin and also      given some Kayexalate.  The patient was placed on IV fluids.  Once      volume resuscitated the patient's potassium corrected and on the      day of discharge the patient's potassium was 3.9.  The patient was      in stable and improved condition.  The patient did not have any EKG      changes.  The patient will be discharged in stable and improved      condition.  5. Anemia.  The patient had come in on admission.  The patient did      come in with a hemoglobin of 11.8.  The patient's baseline  hemoglobin was 8.7.  The patient was fluid resuscitated.  The      patient's hemoglobin decreased which was dilutional in nature.  The      patient did not  have any overt gastrointestinal bleeds and on the      day of discharge the patient's hemoglobin was 8.3 and was      stabilized and not decreasing.  The patient was close to her      baseline.  The patient was maintained on home dose of folic acid      and iron sulfate.  GI had seen the patient and the patient is due      for a colonoscopy on September 16, 2007.  This will be followed up      per PCP and per GI.  6. High output ileostomy.  The patient had presented with increased      ileostomy output.  During the hospitalization the patient was fluid      resuscitated, stool cultures were checked which came back all      negative.  C.  difficile was negative x3 and the patient's      ileostomy output improved daily and ostomy nurse was also obtained      and they came and saw the patient.  The patient's ileostomy output      improved and on the day of discharge the patient's ileostomy output      was 575.  The patient was discharged in stable and improved      condition.  7. Type 2 diabetes.  As the patient had presented nauseous and with      emesis the patient was placed on Lantus 20 units as well as a      sliding scale insulin.  The patient's hemoglobin A1c was also      obtained during the hospitalization and the patient's hemoglobin      A1c came out at 5.7.  The patient's CBCs were checked which ranged      from 61 through 104.  The patient was maintained on Lantus 20 units      daily and the patient will be discharged home on Lantus 20 units      daily for followup with PCP for further titration of the patient's      insulin.  8. Hypotension.  The patient initially came in hypotensive and all her      blood pressure medications were discontinued.  Once the patient was      volume resuscitated her blood pressure improved.  The patient was      started on Norvasc 5 mg daily to be titrated as needed for the      patient's hypertension.  In view of the patient's return to the       hospital secondary to hypovolemic hypotension in the space of 1-2      months it was felt that we should hold off and not restart the      patient's ACE inhibitor or diuretic and use calcium channel blocker      like Norvasc instead for better blood pressure control.  If needed      per PCP the patient's Norvasc can be titrated as needed. 9. Crohn's disease status post ileostomy was stable throughout the      hospitalization.  The patient was maintained on Asacol.  The      patient was seen by GI in consultation and it was felt that the  patient's Asacol needs to be discontinued on discharge and this      will be followed up as outpatient GI at the time of colonoscopy,      will be decided what other mesalamine products the patient needs to      be placed on.  The rest of the patient's chronic medical issues      were stable throughout the hospitalization.   The patient will be discharged in stable and improved condition to  follow up with PCP as stated.  Temperature 98.5, pulse of 59, blood  pressure 150/67, respiratory rate 12, satting 99% on room air.  CBG  ranged from 94-171.  Labs  on day of discharge white count 5.9, hemoglobin 8.3, platelets 193,  hematocrit 24.4, sodium 144, potassium 3.9, chloride 103, bicarb 37, BUN  36, creatinine 1.20, glucose of 82, albumin 2.5, calcium 7.1, phosphorus  2.0.  It has been a pleasure taking care of Ms. Margerite Impastato.      Ramiro Harvest, MD  Electronically Signed     DT/MEDQ  D:  09/13/2007  T:  09/13/2007  Job:  [id number]   cc:   Linward Natal, Dr   Shirley Friar, MD  Fax: [phone]   Dyke Maes, M.D.  Fax: [phone]

## 2011-01-16 NOTE — Consult Note (Signed)
NAMERUMOR, SUN                ACCOUNT NO.:  [id number]   MEDICAL RECORD NO.:  [id number]          PATIENT TYPE:  INP   LOCATION:  2316                         FACILITY:  [facility]   PHYSICIAN:  Hollice Espy, M.D.DATE OF BIRTH:  01/19/1946   DATE OF CONSULTATION:  07/08/2007  DATE OF DISCHARGE:                                 CONSULTATION   PRIMARY CARE PHYSICIAN:  Darius Bump, M.D.   REASON FOR MEDICAL CONSULTATION REQUEST:  Medical management.   HISTORY OF PRESENT ILLNESS:  The patient is a 65 year old white female  with past medical history of diabetes, hypertension and Crohn's disease  who presented with a colonic obstruction and dilated right colon  pneumatosis.  This was found on July 07, 2007.  She was admitted and  underwent a subtotal colectomy and ileostomy on July 08, 2007,  afternoon.  She tolerated the procedure well.  Postop she has been seen  tonight and is resting comfortably.  Her sugars have been elevated post  procedure.  Although, she is on p.o., she is on D-5 and her IV fluids  with no continuous insulin and sliding scale only.  Sugars ranged in the  mid 200's.  Blood pressure has been stable on IV Lopressor.  Currently,  she is sleeping comfortably but awakens.  She answers questions  appropriately and has no complaints.  She denies any pain and goes back  to sleep.  She tells me she has no shortness of breath.  No chest pain.  No trouble swallowing.  She currently complains of no abdominal pain.  No nausea or vomiting.   REVIEW OF SYSTEMS:  Otherwise, negative.   PAST MEDICAL HISTORY:  1. Asthma.  2. Hypertension.  3. Diabetes mellitus.  4. History of Crohn's disease.  5. Tobacco abuse.  6. Obesity.  7. GERD.   MEDICATIONS IN THE ICU:  1. Zosyn 3.375 g q.6 h.  2. Lopressor 5 mg q.6 h.  3. Albuterol 2.5 q.6 h.  4. PCA Dilaudid pump.  5. Protonix 40 IV daily.  6. Lovenox 40 subcu daily.  7. NovoLog sliding scale.  8. P.r.n.  Benadryl, Compazine, Zofran and Narcan.   MEDICATIONS AT HOME:  She is on Asacol, insulin, Lantus, Protonix,  Lisinopril and folic acid.   ALLERGIES:  CODEINE.   SOCIAL HISTORY:  Tobacco use.  No current alcohol or drug use.   FAMILY HISTORY:  Noncontributory.   PHYSICAL EXAMINATION:  VITAL SIGNS:  Heart rate 79, blood pressure  112/52, respirations 12, temperature 98.3, O2 saturation 97% on 3  liters.  GENERAL:  She is resting comfortably.  Alert and oriented x3.  No prior  distress.  HEENT:  Normocephalic, atraumatic.  Mucous membranes are slightly dry.  No carotid bruits.  HEART:  Regular rate and rhythm.  S1, S2, 2/6 systolic ejection murmur.  LUNGS:  Clear to auscultation bilaterally.  ABDOMEN:  Soft, bandaged.  No bowel sounds.  EXTREMITIES:  No cyanosis, clubbing.  Trace pitting edema.   LABORATORY DATA:  Her labs from early this morning of November 4, are  sodium 137, potassium 3.7,  chloride 103, bicarbonate 24, BUN 26,  creatinine 0.9, glucose 140, white count 17.5, H&H 11.3 and 34.  MCV 88,  platelet count 612.   ASSESSMENT/PLAN:  1. Crohn's disease status post colonic obstruction status post      subtotal colectomy for surgery.  Continue n.p.o., IV Protonix,      tolerate pain control.  2. Hypertension.  Stable.  She is on IV Lopressor.  Would continue      until she is able to take p.o.  3. Diabetes mellitus.  She is receiving D-5 through her IV fluids.      Will plan to add low-dose Lantus 5 units subcu which should provide      some basal insulin for her.  4. History of asthma, stable.      Hollice Espy, M.D.  Electronically Signed     SKK/MEDQ  D:  07/09/2007  T:  07/09/2007  Job:  [id number]   cc:   Thomas A. Cornett, M.D.  Danise Edge, M.D.  Darius Bump, M.D.

## 2011-01-16 NOTE — Discharge Summary (Signed)
Joanne, Oconnell                ACCOUNT NO.:  [id number]   MEDICAL RECORD NO.:  [id number]          PATIENT TYPE:  INP   LOCATION:  5743                         FACILITY:  [facility]   PHYSICIAN:  Jeanella Flattery, MD         DATE OF BIRTH:  1946-02-22   DATE OF ADMISSION:  07/07/2007  DATE OF DISCHARGE:  07/16/2007                               DISCHARGE SUMMARY   ADMITTING PHYSICIAN:  Danise Edge, M.D.   DISCHARGING PHYSICIAN:  Dr. Jeanella Flattery.   OPERATING SURGEON:  Angelia Mould. Derrell Lolling, M.D.   [facility].   REASON FOR ADMISSION:  [name] is a 65 year old female who has a  history of chronic Crohn's colitis, diagnosed by colonoscopy in 2004 and  2007.  She presented to the ER on July 07, 2007, with progressively  worsening abdominal pain and nausea for the past 5 days, unassociated  with vomiting or diarrhea.  At this time her lipase was normal.  She had  a comprehensive metabolic profile that was abnormal with an elevated  serum creatinine of 2.0 and elevated glucose at 140.  Her CBC at this  time revealed a white blood cell count 24,100, hemoglobin 11.7 and a  diagnostic CT scan of the abdomen and pelvis which revealed pneumatosis  of the right colon and dilated small-bowel.  Upon arrival to the ER, the  nurses reported a tense abdominal distention and therefore an NG tube  was placed.  After this was placed 2 liters of fecal material return was  obtained.   PAST MEDICAL/SURGICAL HISTORY:  1. Type 2 diabetes for approximately 18 years.  2. Chronic cigarette smoker.  3. Tubal ligation.  4. Bladder suspension surgery.  5. Hypertension.  6. Remote alcoholism.  7. Crohn's colitis which was found on colonoscopy in 2004 and 2007.  8. Hyperlipidemia.  9. Osteopenia.  10.Right baker's cyst.   PHYSICAL EXAMINATION:  VITAL SIGNS:  Stable.  GENERAL:  [name] was alert despite receiving 12 mg of IV morphine.  Otherwise she is in stable condition,  however, her belly does show  distended and hard with absent bowel sounds and is painful to palpation  predominantly in the right abdomen.   ADMISSION DIAGNOSIS:  Right colonic ischemia and small-bowel  obstruction, rule out mesenteric ischemia.   HOSPITAL COURSE:  At this time Dr. Laural Benes felt due to the types of  problems that Joanne Stillion was experiencing, that a surgical  consultation would be more appropriate.  At this time we were consulted,  and it was felt after evaluation of the patient, that due to the acute  colonic distention and possibility of obstruction that Joanne Marcelino  would be taken to the OR for a colectomy and ileostomy.  Informed  consent was obtained and Dr. Derrell Lolling performed an exploratory laparotomy  with subtotal colectomy with take down of the splenic flexure and  an  ileostomy.  His postoperative diagnosis was large and small-bowel  obstruction secondary to Crohn's colitis with stricture at the  transverse colon.  At this time the patient was taken to PACU in stable  condition.  After this it was unclear which unit the patient was sent  to, however, she was sent to 1 of the monitored beds due to problems  with respiratory, mild respiratory failure.  At this point in time,  [facility] were consulted to manage her medical problems such as  diabetes, hypertension, asthma.  On postoperative day 1, the patient was  feeling better.  Her abdomen is stable and she currently has an NG tube  in.  Medicine has also seen her and she was given Xopenex treatments  q.6h. for treatment for her asthma and COPD.  She continued on this  course for the next several days and on postoperative day 3, the patient  was transferred to 5700 and at this point her ileus, her postoperative  ileus was improving and therefore her NG tube was discontinued and she  was kept n.p.o. except for sips of clear liquids.  Her Zosyn was also  discontinued on this day.  A consult for wound  ostomy care nurse was  also made at this point to come I and teach the patient about her  ileotomy pouch and to change it for her as well as teach her about it so  at discharge she is able to do that.  Over the next several days, the  patient continued to be stable and progress was made from her surgery.  Her Foley at this time was discharged and she was advanced to clear-  liquid diet. She also had her albuterol nebulizers discontinued today  and changed to an albuterol metered dose inhaler.  Her PCA was  discontinued and she was started on Vicodin by mouth.  The next several  days she continued to progress as a proper postoperative patient would  and she was later advanced to a carb-modified solid diet.  Home health  was consulted to teach for ostotomy care as well as to be able to  provide materials for her after discharge.  At this point she was  informed, because she started have some high output in her ostomy, that  she needed to maintain hydration and was started on Gatorade which she  later refused because of the amount of sugar was in the Gatorade and not  waning to exacerbate her diabetes. She was also instructed to be given a  an intake and output sheet for when she goes home, she can record her  intake and output to later show Dr. Derrell Lolling for followup.  Her staples at  this point have not been removed and because she is going home on  postoperative day 8, I have called to set up an appointment for her to  see Dr. Jacinto Halim nurse on Friday which will be postoperative day 10 to  have her staples removed due to her staples being widely spaced. I  wanted her wound to have a chance to heal well before the staples were  removed.  She was also started back on some of her home meds such as we  changed her IV Protonix to p.o. Protonix and we stopped her IV Lopressor  and she was put back on her p.o. lisinopril.  [facility] Hospitalist also  changed her Lantus to 25 units from 30 units which  she was on at home.  At this time the patient was in stable condition and was felt she was  ready to go home and therefore she was discharged on July 16, 2007.   DISCHARGE DIAGNOSES:  1. Crohn's colitis, status  post subtotal colectomy for large bowel      obstruction secondary to Crohn's colitis stricture of the distal      transverse colon.  2. Diabetes.  3. Chronic obstructive pulmonary disease/asthma/tobacco abuse.   DISCHARGE MEDICATIONS:  She is to be discharged on all of her at-home  medications which is consistent with:  1. Asacol.  2. Lisinopril.  3. Protonix.  4. Folic acid.  5. Vitamin D.  6. Vitamin E.  7. Multivitamin.  8. Calcium.  9. Humalog.   Internal medicine has decreased her Lantus dose to 25 units and we have  added Vicodin 5/325 one to two tablets every 4 hours as needed for pain  as well as over-the-counter ibuprofen as directed in conjunction with  the Vicodin to help with inflammation.   DISCHARGE INSTRUCTIONS:  The patient is told to return to work in 6  weeks and is to resume a diabetic diet.  Wound care is per home health  for ostomy care.  She is to increase her activity slowly and she may  walk up steps.  Once staples have come out and Steri-Strips have been  applied, she may shower, however, she is not to bathe for at least 2  weeks.  She has also been told no lifting for 2 weeks, anything greater  than 15 pounds as well as no driving while she is taking any narcotics.  She is to return to Dr. Derrell Lolling for staple removal on Friday July 18, 2007, at 8:45 a.m. She has also been informed to return to Dr. Winfred Burn  and Dr. Talmage Nap who are her primary care physicians for followup.  She has  also been informed she needs to bring all paperwork from her work place  to the office on Friday to have her short-term disability and all other  papers to be signed at that time.  She is also to record all liquid  taken in and all ostomy output on and I&O  sheet that is to be provided  to her from the nurses.  She is also informed if she begins to have a  fever greater than 101.5 or any new or severe pain or any pus-like  drainage or redness around her incision to please call our office as  well.      Letha Cape, PA    ______________________________  Jeanella Flattery, MD    KEO/MEDQ  D:  07/16/2007  T:  07/16/2007  Job:  [id number]   cc:   Angelia Mould. Derrell Lolling, M.D.  Danise Edge, M.D.  Kela Millin, M.D.  Talmage Nap, MD

## 2011-01-19 NOTE — Consult Note (Signed)
NAMEJEANELLE, DAKE                ACCOUNT NO.:  [id number]   MEDICAL RECORD NO.:  [id number]          PATIENT TYPE:  INP   LOCATION:  1316                         [facility]ACILITY[facility]  [facility]   PHYSICIAN:  Shirley Friar, MDDATE OF BIRTH:  1946-05-05   DATE OF CONSULTATION:  DATE OF DISCHARGE:                                   CONSULTATION   DATE OF CONSULTATION:  July 08, 2006.  We were asked to do a consult  today from Dr. Lendell Caprice on Eliseo Squires for colitis and fever.   HISTORY OF PRESENT ILLNESS:  Ms. Olveda is a 65 year old female with known  history of Crohn's colitis.  The patient reports fever and fatigue starting  the day after she received her flu vaccination two weeks ago.  Today, she  had her first episode of vomiting and diarrhea.  She says the vomit was a  yellow liquid.  The diarrhea showed no evidence of melena or hematochezia.  Until today, she has had normal bowel movements, which she reports as two  per day and formed.  She denies any abdominal pain, denies changes in bowel  habits until today, denies melena, hematochezia, denies dysphagia, reflux,  joint pain, myalgias, or changes in vision.  She does describe loss of  appetite and fatigue over the last two weeks along with her afternoon fever  spikes.  The patient reports she does not feel like previous episodes of  Crohn's, temperature spikes every afternoon, T-max over the past 24 hours  has been 103.5.   HOME MEDICATIONS:  1. Asacol 6 tablets p.o. daily.  2. Protonix 40 mg daily.  3. Lisinopril, iron, MVI, calcium, Humalog, and Lantus.   ALLERGIES:  1. PREDNISONE.  2. CODEINE.   PAST MEDICAL HISTORY:  1. Diabetes, type 2.  2. Crohn's disease.  She is a patient of Dr. Evette Cristal with [facility].  3. Hypertension.  4. COPD/asthma.   PAST SURGICAL HISTORY:  1. Tubal ligation.  2. Bladder tack.   FAMILY HISTORY:  Negative for IBD, negative for GI cancers.   SOCIAL HISTORY:  She has a history of alcoholism,  states she uses alcohol  occasionally now, smokes approximately five cigarettes a day and has a long  history of smoking.   PHYSICAL EXAMINATION:  GENERAL:  She is alert and oriented and in no  apparent distress.  VITAL SIGNS:  Temperature 103.5, pulse is 114, respirations are 20, blood  pressure is 141/75.  EYES:  No evidence of episcleritis or jaundice.  NECK:  Positive superficial adenopathy on the left.  HEART:  Regular rate and rhythm.  Interestingly enough, I could hear it even  in her lower abdomen.  She is slightly tachy.  LUNGS:  Expiratory wheeze anteriorly.  ABDOMEN:  Soft, nontender, and nondistended, has positive bowel sounds.   LABORATORY DATA:  Her labs show a white count that is down from 19.3 to  11.1, hemoglobin is 8.9, hematocrit is 26.5, platelets are 436,000.  Chem 7  is within normal limits with the exception of her potassium, which is 3.2,  and her glucose, which is 130.  Labs include a positive E. coli culture  urine culture.  Blood cultures show no growth preliminarily.  She had a  positive Epstein-Barr titer, positive for BCA, IgG, and NA-IgG.   On November 4th, she had a CT scan without contrast that showed colitis from  her mid transverse colon to her descending sigmoid.  This patient was seen  and examined by Dr. Bosie Clos, who has determined her plan of care.  He  describes her as having two weeks of fever with normal formed stool until  today when she had an episode of diarrhea and vomited once.  She denies any  abdominal pain.  The patient has known history of Crohn's colitis diagnosed  in 2004 and is on Asacol for maintenance therapy.  An abdominal CT showed  excessive colitis, but despite this finding her symptoms do not suggest a  Crohn's flare except for the fact that she is tachycardic.  Two weeks of  fever without any other symptoms would be unusual for a Crohn's colitis  flare.  For surveillance purposes and based on CT findings, will need an   evaluation of her colon with a colonoscopy but will hold off on scheduling  it until ID evaluation.  Plan for colonoscopy on November 7th, Wednesday.  If colon is inflamed, the question will be when to start IV steroids.  Dr.  Bosie Clos discussed the risks and benefits of colonoscopy with the patient.      Stephani Police, PA    ______________________________  Shirley Friar, MD    MLY/MEDQ  D:  07/08/2006  T:  07/08/2006  Job:  [id number]   cc:   Graylin Shiver, M.D.  Fax: [phone]   Dr. Lendell Caprice???

## 2011-01-19 NOTE — Discharge Summary (Signed)
NAMESHAKEDA, PEARSE                ACCOUNT NO.:  [id number]   MEDICAL RECORD NO.:  [id number]          PATIENT TYPE:  INP   LOCATION:  1540                         FACILITY:  [facility]   PHYSICIAN:  Angelia Mould. Derrell Lolling, M.D.DATE OF BIRTH:  1946/07/24   DATE OF ADMISSION:  02/26/2008  DATE OF DISCHARGE:  03/02/2008                               DISCHARGE SUMMARY   FINAL DIAGNOSES:  1. Functioning ileostomy.  2. Crohn's colitis with stricture and obstruction, status post      emergent extended right colectomy and ileostomy  3. Insulin-dependent diabetes.  4. Hypertension.  5. Tobacco abuse.  6. History of asthma.  7. Gastroesophageal reflux disease.   HISTORY:  This is a 65 year old white female who underwent emergency  subtotal colectomy and takedown of splenic flexure on July 08, 2007,  for a large bowel obstruction due to her Crohn's colitis with a  stricture in the left transverse colon.  The Crohn's disease appeared to  extend across the splenic flexure and so the resection was taken all the  way down to the mid to distal descending colon.  A Brooke ileostomy was  performed.   She has recovered from this surgery and has regained her health.  She  had a colonoscopy by Dr. Bosie Clos on October 03, 2007, including  examination of the ileum, which was felt to be normal.  There were  some  pseudopolyps in the sigmoid colon and some chronic inflammations but no  evidence of Crohn's disease.  Photography of the rectal and sigmoid  stump looked good.  She was counseled as an outpatient.  She has  undergone a modified bowel prep as an outpatient.  She was brought to  the operating room electively.   HOSPITAL COURSE:  On the day of admission, the patient was taken to the  operating room and underwent laparotomy, takedown of her Nehemiah Settle  ileostomy and anastomosis between her terminal ileum and the distal  descending colon with a stapling technique.  The surgery went well.  Postoperatively  the patient did well.  She did have some atelectasis and  low-grade fever, which cleared up.  She was treated with incentive  spirometry, handheld nebulizers and Mucinex, and after 2 or 3 days her  lungs became quite clear.  She had a bowel movement on the second postop  day, and we started the diet slowly at that time and put her back on her  oral antihypertensives.  Her diabetes was controlled quite well with  sliding scale insulin and Lantus basal insulin at bedtime.   She advanced in her diet and activities quite nicely and was ready for  discharge on June 30th.  At that time, she was tolerating solid diet,  had had several bowel movements.  Initially these were loose and  frequent, but by the time she went home they were more solid and  diminishing in frequency and she felt fine.  She had no wound problems.  She was given a prescription for Vicodin for pain.  She was told to  continue her usual medications, which included  amlodipine,  folic  acid, multivitamins, calcium, Lantus insulin 20 units  at bedtime, Humalog insulin 3 units 3 times a day with meals.  She was  asked to follow up with me in my office in 6 to 7 days to check wound  and get the staples out.      Angelia Mould. Derrell Lolling, M.D.  Electronically Signed     HMI/MEDQ  D:  03/16/2008  T:  03/16/2008  Job:  [id number]   cc:   Shirley Friar, MD  Fax: [phone]   Deirdre Peer. Polite, M.D.

## 2011-01-19 NOTE — H&P (Signed)
NAMECLARENE, CURRAN                ACCOUNT NO.:  [id number]   MEDICAL RECORD NO.:  [id number]          PATIENT TYPE:  EMS   LOCATION:  ED                           FACILITY:  [facility]   PHYSICIAN:  Deirdre Peer. Polite, M.D. DATE OF BIRTH:  10-04-1945   DATE OF ADMISSION:  07/04/2006  DATE OF DISCHARGE:                                HISTORY & PHYSICAL   CHIEF COMPLAINT:  Fever.   HISTORY OF PRESENT ILLNESS:  Ms. Silveira is a 65 year old female with known  history of diabetes, hypertension, colitis who was sent to the ED after  being evaluated at the walk-in clinic for fever for a week.  According to  the patient prior to a week, she had been doing great.  However, shortly  after getting the influenza vaccine, the patient had temperatures as high as  103 that have been up and down over the last week.  She denies any nausea or  vomiting.  No diarrhea.  No constipation.  No symptoms typical of her flare  of Crohn's.  In fact, her Crohn's has been quiescent for some time.  She  currently is only on Asacol.  The patient is a smoker.  Denies any  productive cough.  The patient denies any dysuria but has some mild urinary  frequency.  Just malaise and no energy.   As stated, the patient was seen in the walk-in clinic today, had a nonfocal  exam; however, CBC revealed significant leukocytosis of 20,000.  In the ED,  the patient was evaluated and found to have a temperature of 101.  Otherwise, hemodynamically stable.  UA showed small leukocyte esterase.  Urine WBCs 0-2 and rare bacteria.  White count of 18,000, hemoglobin 10.5,  MCV 87, platelets 580, neutrophil count 83%.  CMET significant for a sodium  of 130, potassium of 3.6, creatinine 0.8.  LFTs within normal limits.  Chest  x-ray:  No active cardiopulmonary disease.   Because of the patient's fever and significant leukocytosis, [facility] was called for evaluation and admission.  At the time of my  evaluation, the patient was  alert and oriented x3 and retells the story as  stated above.  Admission is deemed necessary for recurrent fever and  significant leukocytosis.   PAST MEDICAL HISTORY:  As stated above, significant for diabetes,  hypertension and Crohn's disease diagnosed two years ago, asthma.   MEDICATIONS:  Asacol, Protonix, Lisinopril, insulin via Humalog sliding  scale and the last was 38 units q.h.s.   SOCIAL HISTORY:  Positive for tobacco approximately a fourth pack per day.  Social alcohol.  No drugs.   ALLERGIES:  Reports allergies to CODEINE.   FAMILY HISTORY:  Mother deceased secondary to CVA.  Father had diabetes as  well as MI.  One sister with diabetes.   REVIEW OF SYSTEMS:  As stated in the HPI.   PHYSICAL EXAMINATION:  T-max 101 (T-max current 98.1), BP 129/80, pulse 93,  respiratory rate 20, saturating 98%.  HEENT:  Unremarkable.  Neck supple.  No adenopathy.  CHEST:  Clear without rales or rhonchi.  The  patient did have a few  expiratory wheeze which cleared plus cough.  CARDIOVASCULAR:  Regular.  No S3.  No murmur, rub, or gallop.  ABDOMEN:  Soft and nontender.  No hepatosplenomegaly.  EXTREMITIES:  No clubbing, cyanosis, or edema.  NEUROLOGICAL:  Nonfocal.   LABORATORY DATA:  As stated above.   ASSESSMENT:  1. Fever x1 week:  Differential diagnoses include viral versus reaction to      flu vaccine versus an occult process, i.e. bronchitis, urinary tract      infection, bacteremia or other occult abdominal process with a known      history of colitis.  2. Leukocytosis of 18,000.  3. Minimally abnormal urinalysis:  The patient does admit to some      frequency but denies any dysuria.  4. Crohn's:  Please note fairly normal abdominal exam and no      gastrointestinal systems.  5. Diabetes.  6. Hypertension.   RECOMMENDATIONS:  Recommend the patient be admitted to a medicine floor bed.  We will give IV fluids and antibiotics for probable UTI.  With the patient's  history  of Crohn's disease, underlying diabetes, I will consider CT of the  abdomen and pelvis to rule out occult process if the patient has localizing  symptoms to her abdomen.  The patient will be pancultured.  We will have  follow-up labs in the a.m. and we will make further recommendations as  deemed necessary.      Deirdre Peer. Polite, M.D.  Electronically Signed     RDP/MEDQ  D:  07/05/2006  T:  07/05/2006  Job:  [id number]   cc:   Darius Bump, M.D.  Fax: [phone]

## 2011-01-19 NOTE — Discharge Summary (Signed)
NAME:  [name], [name]                          ACCOUNT NO.:  [id number]   MEDICAL RECORD NO.:  [id number]                   PATIENT TYPE:  INP   LOCATION:  5016                                 FACILITY:  [facility]   PHYSICIAN:  Darius Bump, M.D.             DATE OF BIRTH:  1946-06-24   DATE OF ADMISSION:  09/13/2002  DATE OF DISCHARGE:                                 DISCHARGE SUMMARY   ADMISSION DIAGNOSES:  1. Nausea and vomiting and diarrhea, questionable inflammatory bowel diease.  2. Pyuria.  3. Diabetes.  4. Hypertension.  5. Hypokalemia.   DISCHARGE DIAGNOSES:  1. Focal active colitis, probable ulcerative colitis.  2. Hypokalemia from diarrhea, resolved.  3. Diabetes mellitus.  4. Hypertension.  5. Hiatal hernia.  6. Pyuria.   CONSULTS:  Althea Grimmer. Luther Parody, M.D.   PROCEDURES:  Esophagogastroduodenoscopy and colonoscopy September 15, 2002.   CONDITION ON DISCHARGE:  Improved.   DISCHARGE MEDICATIONS:  1. Asacol 400 mg 2 p.o.  q.i.d.  2. Flagyl 250 mg q p.o. q.i.d. x4 days.  3. Glyburide 10 mg b.i.d.  4. Lisinopril 10 mg b.i.d.  5. Protonix 40 mg q.d.  6. Albuterol MDI 1 to 2 puffs q.4-6h. p.r.n.  7. Avandia 4 mg q.d.  8. Iron sulfate 325 mg q.d.   FOLLOW UP:  She was to follow up to see Dr. Luther Parody within 7 to 10 of  discharge. She was to follow up to see Dr. Daphine Deutscher within 1 week of discharge  for followup regarding her diabetes.   BRIEF HISTORY:  The patient is an extremely pleasant 65 year old woman who  was admitted from home with a several week to month history of anorexia,  nausea and vomiting, diarrhea and fatigue.  Prior to  admission she had been  seen in the office with what appeared to be resolving symptoms, but possibly  due to gastroenteritis which was improving along with possibly due to her  glucophage which was held, due to lack of improvement with the above, she  was admitted to the emergency room with continued symptoms.   PHYSICAL  EXAMINATION:  VITAL SIGNS:  Temperature 101.9, blood pressure  146/82, heart rate 117, respirations 20.  HEENT:  Unremarkable.  NECK:  Unremarkable.  LUNGS:  Diffuse end expiratory wheezes.  HEART:  Regular rate and rhythm.  ABDOMEN:  Soft without masses. Mild lower quadrant tenderness without  rebound.  RECTAL:  Without stool in the vault.   LABORATORY DATA:  Admission labs showed a white count of 20,000 with more  than 20% bands, hemoglobin 10.6, platelet count was elevated at 572. Liver  function tests were normal.  Lipase 18, amylase 38. Urine showed 7 to 10  white cells. Urine culture was not obtained. ESR 125.   HOSPITAL COURSE:  PROBLEM #1, NAUSEA AND VOMITING AND DIARRHEA AND ABDOMINAL  PAIN:  She was admitted and placed on IV fluid. A CT  scan of the abdomen  revealed thickening of the splenic flexure. A GI consultation was obtained  and on September 15, 2002, she underwent an EGD and colonoscopy. The EGD  revealed a hiatal hernia with a normal stomach and duodenum. Colonoscopy  revealed colitis from the rectum to the cecum. Biopsies of the small  intestine showed mild to moderate chronic inflammation. Colon biopsy showed  focal active colitis.   Following colonoscopy she was placed on Flagyl and Asacol with considerable  symptomatic improvement. On the day prior to discharge she had semi formed  bowel movements and improved tolerance of p.o.   PROBLEM #2, ANEMIA:  She was admitted with a hemoglobin of 10.6. With  hydration her hemoglobin dropped to 8.3; it was 8.7 on the day prior to  discharge. Indices were consistent with iron deficiency. She was started on  iron at the time of discharge.   PROBLEM #3, DIABETES MELLITUS: She has a long history of poorly controlled  diabetes. Glucophage had been held approximately 1 week  prior to  admission. She had been placed on Avandia in the past but had not been  taking it. Initially she was placed just on sliding scale insulin, but  on  the day prior to discharge, Glyburide was restarted. Glucoses remained  elevated. She is being discharged on Glyburide plus Avandia with close  outpatient followup. Liver function tests were normal during  hospitalization.   PROBLEM #4, HYPERTENSION: Blood pressure was low due to dehydration on  admission. Antihypertensives were initially held, but lisinopril is being  restarted at the time of discharge. Her hydrochlorothiazide is continuing to  be held.   PROBLEM #5, HYPOKALEMIA WITH DEHYDRATION ON ADMISSION: She had significant  hypokalemia. This was replaced with IV and p.o. potassium. On the day prior  to discharge her potassium was 4.3. She had been receiving oral regular  supplements, but as lisinopril was being restarted and diarrhea had ceased,  she is being discharged without continued potassium supplementation.   PROBLEM #6, CHRONIC OBSTRUCTIVE PULMONARY DISEASE:  She did require  albuterol nebulizers during hospitalization for mild expiratory wheezing.  She has MDI available for her upon discharge.   PROBLEM #7:  PYURIA:  A urine culture was not obtained. Blood cultures were  negative. She was treated with IV Cipro for 3 days which was switched to  p.o. on the day prior to discharge. She is being stopped at the time of  discharge.                                               Darius Bump, M.D.    MJM/MEDQ  D:  09/17/2002  T:  09/17/2002  Job:  [id number]

## 2011-01-19 NOTE — Op Note (Signed)
[name], [name]                ACCOUNT NO.:  [id number]   MEDICAL RECORD NO.:  [id number]          PATIENT TYPE:  INP   LOCATION:  1316                         FACILITY:  [facility]   PHYSICIAN:  Shirley Friar, MDDATE OF BIRTH:  05-03-1946   DATE OF PROCEDURE:  DATE OF DISCHARGE:                                 OPERATIVE REPORT   PROCEDURE:  Colonoscopy.   INDICATION:  Fever, history of Crohn disease, thickening of colon on CT  scan.   MEDICATIONS:  Fentanyl 75 mcg IV, Versed 8 mg IV.   FINDINGS:  Rectal exam was normal.  A pediatric adjustable colonoscope was  inserted into an adequately prepped colon and advanced to the cecum where  the ileocecal valve and appendiceal orifice were identified.  During  insertion, there was a very edematous area from 40-50 cm with erythema and  scattered ulcerations consistent with Crohn's colitis.  There was no active  bleeding noted in this segment.  Prior to withdrawal of the colonoscope, the  terminal ileum was intubated and was normal in appearance and this was  biopsied.  On withdrawal of the colonoscope, the right side of the colon  appeared normal endoscopically and random biopsies were taken on the right  side.  Random biopsies also taken the left side including this edematous  ulcerated area in the 40-50 cm region.  On further withdrawal, there was  some scattered aphthous ulcers noted in the sigmoid colon rectal sigmoid  colon and rectum.  The rectum was otherwise spared except for these  scattered aphthous ulcerations.  Retroflexion was not attempted due to  decreased size of the rectum.  The careful withdrawal of the colonoscope did  not reveal any other abnormalities except as stated above.   ASSESSMENT:  1. Crohn's colitis left-sided status post multiple biopsies.  2. Scattered aphthous ulcers in rectum, otherwise normal rectum consistent      with Crohn disease.   PLAN:  1. Start IV Solu-Medrol x 24 hours and change to  p.o. prednisone if fever      resolves.  2. Resume regular diet.  3. Continue Asacol.  4. If unable to taper the patient off prednisone and patient becomes      steroid-dependent or steroid refractory, we may need to consider      surgical options but this can be decided as an outpatient.      Shirley Friar, MD  Electronically Signed    VCS/MEDQ  D:  07/10/2006  T:  07/11/2006  Job:  [id number]   cc:   [name], M.D.  Fax: [phone]

## 2011-01-19 NOTE — H&P (Signed)
NAME:  [name], [name] NO.:  [id number]   MEDICAL RECORD NO.:  [id number]                   PATIENT TYPE:  INP   LOCATION:  5016                                 FACILITY:  [facility]   PHYSICIAN:  Candyce Churn, M.D.          DATE OF BIRTH:  02/25/1946   DATE OF ADMISSION:  09/13/2002  DATE OF DISCHARGE:                                HISTORY & PHYSICAL   CHIEF COMPLAINT:  Fever, weight loss, diarrhea, anorexia, anemia, and  leukocytosis.   HISTORY OF PRESENT ILLNESS:  The patient is a pleasant 65 year old female  with fatigue for several months.  She developed a flu-like illness in late  November 2003, and started having persistent diarrhea, evening temperatures  to 102, malaise, anorexia, a 20-pound weight loss, but denies rashes, cough,  headache.  She has been having liquid brown stool, probably four to eight  bowel movements a day.  The family states she looks ashen, gray, pale.  Saw Dr. Madison Hickman approximately one month ago, and Glucophage was held  to see if diarrhea would improve.  She is admitted now for further workup.   PAST MEDICAL HISTORY:  1. Hypertension.  2. Type 2 diabetes mellitus for approximately five to six years.  3. Chronic tobacco abuse, but none in the past two to three weeks.  4. History of ethanol abuse, but light over the past several months.   MEDICATIONS:  Lisinopril, hydrochlorothiazide, Glyburide.  Glucophage  apparently recently held.   ALLERGIES:  _________________.   PAST SURGICAL HISTORY:  1. Tubal ligation.  2. Bladder tack.   FAMILY HISTORY:  Positive for coronary artery disease and type 2 diabetes  mellitus in her parents.   SOCIAL HISTORY:  The patient was apparently a heavy drinker in the past, and  she uses tobacco chronically as above.  Has not smoked in two to three  weeks.  She is married.  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  Husband and  two daughters are present in the emergency room and appear very supportive.   REVIEW OF SYSTEMS:  As above.  Also claims chills.  No history of heart or  lung problems.  Appetite is poor.   PHYSICAL EXAMINATION:  GENERAL:  Pale-appearing female who looks fatigued.  VITAL SIGNS:  Temperature 101.9, blood pressure 146/82, pulse 117 and  regular, respiratory rate 20 and easy.  HEENT:  Pale conjunctivae.  Oropharynx is clear.  NECK:  Supple.  No thyroid noted.  CHEST:  With diffuse mild end-expiratory wheezes.  CARDIAC:  Regular rhythm without murmur or gallop.  ABDOMEN:  Soft.  No obvious hepatomegaly or splenomegaly.  No masses.  She  is tender in the lower abdomen diffusely.  No rebound.  RECTAL:  Negative for stool in vault.  No masses.  Heme-negative mucus.  EXTREMITIES:  Without cyanosis, clubbing,  or edema.  Appears pale in nail  beds.  NEUROLOGIC:  Nonfocal.  SKIN:  Without rashes.   LABORATORY DATA:  Abdominal and pelvic CT pending.  Three-way abdomen is  essentially within normal limits.   White count is elevated at 20,500, with greater than 20% bands, 91% polys.  Hemoglobin 10.6, platelet count 572,000.  Electrolytes:  Sodium 131,  potassium 3.0, chloride 90, bicarbonate 24, BUN 12, creatinine 0.9, blood  sugar 287.  LFTs were within normal limits.  Lipase was 18, amylase was 38.  Urinalysis:  Ketones 15, glucose greater than 1000, leukocyte esterase was  moderate with 7-10 white cells, many bacteria, many epithelial cells.  Blood  cultures x 3 pending.  Sedimentation rate pending.  Stool for Giardia and  cryptosporidial antigen pending.  Stool for enteric pathogens and C.  difficile antigen are pending.   ASSESSMENT:  The patient is a 65 year old female with leukocytosis, daily  fevers, diarrhea, new abdominal pain, weight loss, and lethargy.  I wonder  if she may have inflammatory bowel disease such as Crohn's.  I wonder if she  could have  an abdominal neoplasm or abscess.  I doubt this is simply related  to a urinary tract infection.  I did not hear any heart murmurs to suggest  subacute bacterial endocarditis.  She does have a history of diabetes and  hypertension.   PLAN:  Follow up on abdominal and pelvic CT, GI consult with Dr. Dorena Cookey.  Will follow up on laboratories as above.  Treat with IV Cipro for now; if  this is Crohn's, this may be somewhat helpful.  Sliding scale Regular  Insulin for diabetes.  Will provide IV hydration.  Will also begin anemia  workup with iron, iron-binding _______________ and heme stool for blood.  Also check TSH, free T4, B12, and folate.                                               Candyce Churn, M.D.    RNG/MEDQ  D:  09/14/2002  T:  09/14/2002  Job:  [id number]   cc:   Everardo All. Madilyn Fireman, M.D.  [address]  [place]  [place] [address]  Fax: [phone]   Darius Bump, M.D.  [id number]  [place] [address]  Fax: [phone]

## 2011-01-19 NOTE — Discharge Summary (Signed)
NAMETYSHAE, STAIR                ACCOUNT NO.:  [id number]   MEDICAL RECORD NO.:  [id number]          PATIENT TYPE:  INP   LOCATION:  1316                         [facility]AC[facility]   PHYSICIAN:  Hollice Espy, M.D.DATE OF BIRTH:  11-22-1945   DATE OF ADMISSION:  07/04/2006  DATE OF DISCHARGE:  07/11/2006                                 DISCHARGE SUMMARY   CONSULTS:  1. Shirley Friar, MD, [facility].  2. Cliffton Asters, M.D., infectious disease.   PRIMARY CARE PHYSICIAN:  She initially was seeing Dr. Madison Hickman, who is  leaving the [facility] practice, and she is planning on following up with Dr.  Catha Gosselin as her new PCP.   HOSPITAL COURSE:  Patient is a 65 year old white female with a past medical  history of diabetes, hypertension, and Crohn's disease, who was sent to the  emergency department after being evaluated at the walk-in clinic for fever  x1 week.  Patient last had a flare several years ago for her Crohn's  disease, but at that time it was severe abdominal pain, and this did not  seem to be typical for that.  Her Crohn's had been quiescent for some time,  and she had only been on Asacol, doing well.  She presented to the emergency  room, however, with a white count of 20,000 and a temperature of 101.  Her  UA only showed a small leukocyte esterase, and the rest of her labs were  only notable for a neutrophil count of 83%, sodium 130, and a normal chest x-  ray.  Patient was started on IV fluids, antibiotics for probable UTI, and  further evaluation.   HOSPITAL COURSE:  1. In regard to the patient's Crohn's disease flare, she was evaluated and      by hospital day #2, was feeling better.  Her temperature had come down      to 97.9.  Her white count improved down to 11.9, but she appeared to be      slightly anemic, but the rest of her labs were unremarkable.  The      source of her fevers proved to be negative, in terms of her blood      cultures, Monospot  test, peripheral smear.  Her urine cultures did grow      out positive for 100,000 colonies of E. Coli; however, it was very      minimal, and she was pan-sensitive, which she completed a full course      of antibiotics.   By November 4, the patient underwent a CT of the abdomen and pelvis with  contrast was ordered to further delineate any sort of process.  The  patient's CT scan came back the evening of November 4 showing signs of  extensive colitis involving the long segment of the colon from the mid  transverse to the descending, to the descending sigmoid junction.  A small  lipoma was noted on the pelvis but otherwise unremarkable.  With these  findings, given the patient's previous history of Crohn's disease, that was  felt to be  a possibility, although other concerns __________ ischemic or  infectious, given this was a quite unusual presentation for Crohn's flareup.  GI was consulted at this point to evaluate the patient and likely felt that  she would need a colonoscopy.   Patient underwent a colonoscopy on July 09, 2006.  There he noted diffuse  colitis but with the findings were quite consistent with Crohn's disease.   Infectious disease was consulted again prior to patient's colonoscopy, again  to better quantify and make sure no other sources of infection were found.  Patient at this point, by November 5, had been on day #4 overall with  antibiotics, day #2 of Rocephin, day #2 of Flagyl.  Stools for C. diff were  sent, which came back negative.  They likely, after the colonoscopy findings  consistent with Crohn's, GI and infectious disease were in agreement that  this was the cause for patient's problems.  Patient post colonoscopy was put  on IV Solu-Medrol, which she tolerated well overnight.  Then was changed  over to p.o. prednisone 20 mg p.o. b.i.d.   On the evening of November 6, patient was significantly feeling better.  She  had no complaints.  All previous stool  assays were negative.  CMB IgMs were  negative, and it was felt that her fever was, again, related to her Crohn's  colitis.  In discussion with Dr. Bosie Clos, the plan will be to discharge the  patient to home with p.o. prednisone 20 mg p.o. b.i.d. x1 week.  At that  time, he will follow up with the patient and determine if further course of  steroids is indicated.  In addition to that, in regards to the patient's  diabetes, we will up her sliding scale from 3 mg subcu before meals to 8 mg  subcu before meals to cover her for hyperglycemia.  Discussed this plan with  the patient, and she is amenable to it.   Patient's overall disposition is improved.  Her activity will be slow to  increase.  Her discharge diet will be a carbohydrate-modified diet.  She  will be discharged to home with followup in one week with Dr. Bosie Clos of  [facility] and Dr. Talmage Nap of endocrinology in adjustment of her sugars.  Patient will also follow up with her new PCP, Dr. Clarene Duke, in the next 1-2  weeks.   DISCHARGE DIAGNOSES:  1. Crohn's disease with acute exacerbation.  2. Hyperglycemia secondary to steroids.  3. Diabetes mellitus.  4. Tobacco abuse.   DISCHARGE MEDICATIONS:  New medication:  Prednisone 20 p.o. b.i.d. x1 week.  Follow up with Dr. Bosie Clos to determine further length of course of  steroids.  Other medications will be continued as the same.  Asacol 6 tabs  p.o. daily, Protonix 40 p.o. daily, lisinopril 1 p.o. daily, iron 2 tabs  p.o. daily, multivitamin p.o. daily, calcium 1 tab p.o. daily.  Humalog  insulin.  This will be increased to 8 units t.i.d. with meals while patient  is on steroids and then back to the usual 3 units subcu with meals.  Then  Lantus insulin 38 units subcu nightly.      Hollice Espy, M.D.  Electronically Signed     SKK/MEDQ  D:  07/11/2006  T:  07/11/2006  Job:  [id number]   cc:   Cliffton Asters, M.D.  Fax: [phone]  Anna Genre. Little, M.D.  Fax: [phone]    Darius Bump, M.D.  Fax: [phone]   Laurena Bering.  Bosie Clos, MD  Fax: [phone]   Dorisann Frames, M.D.  Fax: [phone]

## 2011-01-19 NOTE — H&P (Signed)
NAME:  [name], [name]                          ACCOUNT NO.:  [id number]   MEDICAL RECORD NO.:  [id number]                   PATIENT TYPE:  INP   LOCATION:                                       FACILITY:  [facility]   PHYSICIAN:  Darius Bump, M.D.             DATE OF BIRTH:  1945-11-23   DATE OF ADMISSION:  01/01/2003  DATE OF DISCHARGE:                                HISTORY & PHYSICAL   IDENTIFICATION:  [age]-year-old woman with nausea, vomiting, diarrhea,  fever, abdominal pain.   HISTORY OF PRESENT ILLNESS:  The patient is a 65 year old woman who was  hospitalized in January 2004 with a similar presentation.  At that time, she  was diagnosed as having colitis.  CT scan of her abdomen and pelvis at that  time showed wall thickening and pericolonic inflammation in the splenic  flexure with numerous small pericolonic lymph nodes.  CT of the pelvis was  unremarkable.  GI consultation was obtained; Dr Althea Grimmer. Santogade followed  the patient.  She had an EGD which showed a hiatal hernia.  She had biopsies  done of her duodenum.  Colonoscopy showed evidence of colitis in the hepatic  flexure, the cecum and the rectum; there were shallow ulcers also seen in  the rectum.  Biopsies per Dr. Joanette Gula note showed a focal active colitis  which could have been a self-limiting infection.  She was treated with  Flagyl and Asacol and improved considerably.  She was maintained on low-dose  Asacol and was in her usual state of health until a week ago, when she again  developed severe abdominal pain.  She had nausea with vomiting last weekend;  she also had diarrhea without visible blood.  She called the  gastroenterologist on call last week and Asacol dose was increased, but she  continued to have symptoms.  She saw Dr. Luther Parody early this week.  Sed  rate was again over 100, as it had been on prior admission, white count was  22,000 and hemoglobin A1c was 11 (a history of poorly  controlled diabetes in  the past).  Blood cultures were drawn yesterday and pending.  Chest x-ray  was done yesterday and is pending.  She was placed on empiric Cipro.  She  comes in today for followup, reporting that she is still having lower  abdominal pain and diarrhea; she is feeling increasingly lightheaded and  dizzy; she has persistent anorexia as well.   PAST MEDICAL HISTORY:  1. Episode of colitis, as above.  2. Diabetes mellitus, longstanding, poor control; at last OV here, was to     learn Lantus administration, likely will require Lantus in addition to     oral agents at time of discharge.  3. Hypertension.  4. Tobacco use.  5. ETOH use, heavy in the past.  6. COPD.  7. Hyperlipidemia.  8. Osteopenia.   PAST SURGICAL HISTORY:  1. Tubal ligation.  2. Bladder tack surgery.   MEDICATIONS PRIOR TO ADMISSION:  1. Cipro 500 mg b.i.d.  2. Lisinopril 10 mg.  3. Glyburide 10 mg b.i.d.  4. Asacol -- has been taking three tablets three times daily.  5. Hydrochlorothiazide 12.5 mg.  6. Avandia 8 mg.  7. Protonix 40 mg.   ALLERGIES:  She is allergic to CODEINE.   FAMILY HISTORY:  Family history is remarkable for CAD and diabetes.   SOCIAL HISTORY:  Heavy alcohol in the past, not currently.  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  Very supportive family.   PHYSICAL EXAMINATION:  GENERAL:  Pale, weak-appearing woman in no acute  distress.  Weight is 126, which is down 10 pounds since last visit.  VITAL SIGNS:  Lying down blood pressure is 96/76 with a heart rate of 94;  when she stands, it is 80/70 with a heart rate of 110 and has symptoms of  lightheaded and dizziness.  Temperature 96.9.  HEENT:  Pupils are equal, round and reactive to light.  Extraocular muscles  are intact.  Oropharynx is unremarkable, apart from dehydration.  NECK:  Neck is supple.  There are no bruits.  LUNGS:  Lungs are clear.  HEART:  Tachycardic, irregular, without murmur heard.  ABDOMEN:   Abdomen is soft with normal bowel sounds.  There is mild bilateral  lower quadrant tenderness without rebound or guarding.  RECTAL:  Rectal is without mass.  Stool is heme-negative.   LABORATORY DATA:  White count is 14,000, which is down from 22,000 earlier  this week; hemoglobin is 11; platelet count is elevated at 620,000 and  apparently was elevated at last check as well.  Other labs are currently  pending.   IMPRESSION AND PLAN:  [age]-year-old woman with hospitalization in  January 2004 with colitis, now with recurrence of symptoms.  She is being  admitted due to her significant dehydration and for further evaluation of  her complaints.  It is felt that her dehydration is due to gastrointestinal  losses, but also may have a significant component of glycosuria from her  poorly controlled diabetes.  White blood cell count remains elevated, but is  significantly improved from earlier in week, although her dehydration is  worsening.   Plan is to admit her.  She will be hydrated with intravenous fluid.  CT of  the abdomen will be ordered.  Gastroenterology consultation will be obtained  (Dr. Luther Parody already aware of admission).  She will be continued on  empiric ciprofloxacin.  She will be placed on sliding-scale insulin.  Blood  pressure medicines will be held.  She will be provided with teaching  regarding insulin administration and she will most likely need Lantus  insulin at the time of discharge.                                               Darius Bump, M.D.    MJM/MEDQ  D:  01/01/2003  T:  01/01/2003  Job:  [id number]

## 2011-01-19 NOTE — Discharge Summary (Signed)
NAME:  [name], [name]                          ACCOUNT NO.:  [id number]   MEDICAL RECORD NO.:  [id number]                   PATIENT TYPE:  INP   LOCATION:  5730                                 FACILITY:  [facility]   PHYSICIAN:  Darius Bump, M.D.             DATE OF BIRTH:  [birth year]   DATE OF ADMISSION:  01/01/2003  DATE OF DISCHARGE:  01/06/2003                                 DISCHARGE SUMMARY   CONSULTATIONS:  Althea Grimmer. Luther Parody, M.D.   PROCEDURES:  Flexible sigmoidoscopy January 01, 2003.   ADMISSION DIAGNOSES:  1. Abdominal pain, nausea, vomiting, diarrhea, fever.  2. Poorly controlled diabetes.  3. Hypertension.   DISCHARGE DIAGNOSES:  1. Crohn's disease with improvement in abdominal pain, diarrhea.  2. Diabetes.  3. Hypertension.  4. Baker's cyst right popliteal fossa.  5. Osteopenia.  6. Symptomatic gastroesophageal reflux disease.  7. Anemia with indices consistent with iron deficiency.  8. Hypokalemia.   DISCHARGE MEDICATIONS:  1. Protonix 40 mg p.o. daily.  2. Glyburide 10 mg p.o. b.i.d.  3. Avandia 8 mg p.o. daily.  4. Lantus insulin 16 units subcutaneous at bedtime.  5. Multivitamin.  6. Fosamax 70 mg weekly.  7. Prednisone 40 mg daily decreasing by 5 mg daily every five days until     gone.  8. Asacol three tablets t.i.d.  9. Sliding scale insulin Regular as needed.  10.      Iron sulfate.   FOLLOW UP:  She is to see Althea Grimmer. Luther Parody, M.D. in one week of discharge,  to call that office for follow-up appointment.  See Darius Bump, M.D.  within one week of discharge.  She is to call the office at [phone] two  days following discharge with an update on how her blood sugars are running.   BRIEF HISTORY:  The patient is a very pleasant 65 year old woman who was  admitted to the office on January 01, 2003 with abdominal pain, diarrhea,  nausea, vomiting.  She had a previous admission in January 2004 with similar  complaints at which point she had a  CT scan that showed evidence of colitis.  She had a colonoscopy that showed colitis as well but etiology was not  certain.  She has been maintained on Asacol since that time, but did have  worsening symptoms for seven days prior to admission.  Three days prior to  admission she had been seen by Althea Grimmer. Santogade, M.D. and had a white  count elevated at 22,000, an ESR of greater than 100 and her A1C was 11.  Blood cultures were drawn one day prior to admission and a chest x-ray done  one day prior to admission was without acute disease.   PHYSICAL EXAMINATION:  GENERAL:  She was a pale appearing woman in no acute  distress.  VITAL SIGNS:  Weight 126, blood pressure 96/76 when she was laying down with  a heart rate of 94 decreasing to 80/70 with a heart rate of 110 when she  stood, temperature 96.9.  She did have orthostatic symptoms.  HEENT:  Pupils are equal, round, and reactive to light.  Extraocular  movements are intact.  NECK:  Supple.  There were no bruits.  LUNGS:  Clear.  HEART:  Tachycardic and regular without murmur.  ABDOMEN:  Soft with normal bowel sounds.  Mild bilateral lower quadrant  tenderness without rebound or guarding.  RECTAL:  Heme-negative.   LABORATORIES:  White count on admission was 14,000, platelet count 620,000,  hemoglobin 11.   HOSPITAL COURSE:  Problem 1 - LOWER ABDOMINAL PAIN, DIARRHEA:  A CT scan was  done on the day of admission that showed colitis involving the distal half  of the transverse colon extending to the splenic flexure to the proximal  half of the descending colon and patient underwent a flexible sigmoidoscopy  on January 01, 2003 which showed suspected Crohn's disease from the descending  colon to the rectum.  Erosions were present with pseudo polyps and granular  bleeding.  Pathology report showed active chronic mucosal colitis favoring  Crohn's disease.  She was treated with Asacol throughout hospitalization.  She remained on Cipro  throughout hospitalization.  Blood cultures were  negative.  Urine culture from admission was normal.  When the diagnosis of  Crohn's disease was made by biopsy prednisone was added.  Symptoms were  considerably improved at the time of discharge.   Problem 2 - DIABETES MELLITUS:  She had a history of poorly controlled  diabetes with a hemoglobin A1C of 11 at the time of admission.  She was  taught how to use insulin throughout the hospitalization.  Three days prior  to discharge she was placed on Lantus q.h.s.  However, with prednisone as  well she continued to receive sliding scale insulin.  She was educated on  insulin administration and understands Lantus insulin and sliding scale  insulin and will require both at the time of discharge.  It is our hopes  that over time when she is off prednisone she will be controlled on  Glyburide, Avandia, and Lantus insulin only.   Problem 3 - HYPERTENSION:  Blood pressure remained on the low side  throughout hospitalization.  At time of discharge her lisinopril/HCT which  she was using prior to the admission is being held.  She is to see Darius Bump, M.D. within a week of discharge and that medicine will need to be  resumed at some point.   Problem 4 - ANEMIA:  Hemoglobin dropped to 9 over hospitalization.  Indices  were consistent with iron deficiency with a low iron and a low iron  saturation.  She is being discharged on iron sulfate once daily.   Problem 5 - OSTEOPENIA:  She had had a diagnosis of osteopenia done by bone  density within a few months prior to admission.  Given the prednisone usage  at this point, it was opted to put her on Fosamax.  She does have a history  of GERD and is taking Protonix on a daily basis as well.  Recommendations  for taking Fosamax were reviewed in detail with patient.   Problem 6 - BAKER'S CYST:  Hospital day number three patient noticed a swelling behind her right knee.  An examination revealed a  minimally tender  cystic appearing lesion behind her right knee.  Ultrasound was done and this  looked consistent with a Baker's  cyst.  This will be followed as an  outpatient and may ultimately require orthopedic referral.   Problem 7 - HYPOKALEMIA:  She did have hypokalemia during hospitalization  requiring replacement.  Two days prior to discharge potassium was 3.3 and  was replaced.  Potassium the day of discharge is currently pending.   LABORATORIES:  White count 14.8 at the time of admission.  It was down to  10.4 on Jan 04, 2003.  Hemoglobin was 10.1 at the time of admission down to 9  on Jan 04, 2003.  Renal function remained stable throughout hospitalization.  Platelet count was elevated at 549,000 at the time of admission probably  reflecting inflammation.  It was 421,000 on Jan 04, 2003.  At the time of  dictation laboratories from Jan 06, 2003 are pending.                                               Darius Bump, M.D.    MJM/MEDQ  D:  01/06/2003  T:  01/07/2003  Job:  [id number]   cc:   Althea Grimmer. Luther Parody, M.D.  [address], [address]  [place]  [place] [address]  Fax: [phone]

## 2011-01-19 NOTE — Consult Note (Signed)
NAME:  [name], [name]                          ACCOUNT NO.:  [id number]   MEDICAL RECORD NO.:  [id number]                   PATIENT TYPE:  INP   LOCATION:  5016                                 FACILITY:  [facility]   PHYSICIAN:  Althea Grimmer. Luther Parody, M.D.            DATE OF BIRTH:  1945/12/19   DATE OF CONSULTATION:  09/14/2002  DATE OF DISCHARGE:                                   CONSULTATION   HISTORY OF PRESENT ILLNESS:  The patient is a 65 year old female type 2  diabetic and chronic smoker who presents with a two-month history of  progressive anorexia, nausea, vomiting, diarrhea and fatigue.  She has lost  20 pounds since November.  She says she is having four to eight liquid  nonbloody bowel movements.  Whenever she tries to eat, she vomits the food  or clear liquids.  She has had no hematemesis, gross hematochezia or melena.  She previously was having mild lower abdominal pain. She says that currently  the pain seems to have resolved and as long as she does not try to eat, she  does not feel particularly nauseated and does not vomit.  She thought some  of her diarrhea was due to Glucophage but this has been held with minimal  improvement.  She has never had any endoscopic or radiographic evaluation of  her GI tract until a CT scan done within the past 24 hours.  The official  report of this is not yet available.  Preliminary report suggests that there  was splenic flexure thickening of the large-bowel.  The patient was also  noted to be febrile to 101.4 with an elevated white count on admission;  however, her fever has fallen to normal and with hydration her hemoglobin  has also fallen to 8.3.   PAST MEDICAL HISTORY:  This is pertinent for type 2 diabetes for almost 10  years and tobacco abuse.   MEDICATIONS:  Her outpatient medications included lisinopril, Glyburide and  hydrochlorothiazide.   FAMILY HISTORY:  This is negative for colorectal neoplasia or inflammatory  bowel  disease.   PAST SURGICAL HISTORY:  Tubal ligation  and bladder suspension.   SOCIAL HISTORY:  The patient is married.  She is a social drinker.  She has  not smoked in the past month because it makes her nauseous.  She has also  not had any alcohol in the past month.   REVIEW OF SYSTEMS:  GENERAL:  A 20 pound weight loss over the past two  months.  ENDOCRINE:  This is positive for type 2 diabetes.  No known thyroid  problems.  SKIN:  No rash or pruritus.  EYES:  No icterus or change in  vision.  ENT:  No aphthous ulcers or chronic sore throat.  RESPIRATORY:  No  shortness of breath, cough or wheezing.  CARDIAC:  No chest pain,  palpitations or history of valvular heart disease. GI:  As above.  GU:  No  dysuria or hematuria.  The remainder of the review of systems is negative.   PHYSICAL EXAMINATION:  GENERAL APPEARANCE:  VITAL SIGNS:  She is afebrile with a blood pressure of 100/63, pulse 77 and  regular.  SKIN:  Normal.  HEENT:  Eyes anicteric.  Oropharynx unremarkable.  NECK:  Supple without thyromegaly.  There is no cervical or inguinal  adenopathy.  CHEST:  Her chest sounds are clear.  CARDIOVASCULAR:  Regular rate and rhythm.  ABDOMEN:  Soft with normal bowel sounds and without mass, tenderness or  organomegaly. There may be a minimal fullness in the epigastric area.  RECTAL:  Examination is not performed.  EXTREMITIES:  There is no clubbing, cyanosis, or edema.  No rash.  Dorsal  pedis pulses are 1 to 2+ bilaterally.   LABORATORY DATA:  Hemoglobin 8.3, white blood cell count 11.2, platelet  count normal, MCV 84.9.  Sedimentation rate is 125.  Potassium this morning  was 2.4 but is being repleted.  Blood sugar on admission was 287 but is down  to 139.  Amylase and lipase were normal.  TSH normal.  Urinalysis  demonstrates some pyuria but nitrite negative.  Blood cultures are negative  to date.   IMPRESSION:  A 65 year old female with fever, weight loss, diarrhea,  anorexia  and nausea and an initially elevated white blood  count.  She seems  improved having received ciprofloxacin IV on admission.  Blood cultures to  date are negative.  CT scan apparently suggested thickening of the splenic  flexure though this has not been reviewed and an official report is not yet  available.  Leading diagnoses would be ischemic colitis or an infectious  process possibly superimposed on gastroparesis or diabetic diarrhea.  Neoplasia and gastric outlet obstruction as additional problems also need to  be ruled out, however.   PLAN:  Upper and lower endoscopy are reviewed with the patient in terms of  technique, preparation and risks of complications including bleeding and  perforation.  She agrees to proceed and they will be performed tomorrow to  evaluate for the above differential diagnoses.  Please see the orders.  Further recommendations to follow these procedures.                                               Althea Grimmer. Luther Parody, M.D.    PJS/MEDQ  D:  09/14/2002  T:  09/14/2002  Job:  [id number]   cc:   Darius Bump, M.D.  [address] [place]  [place] [address]  Fax: [phone]

## 2011-03-30 ENCOUNTER — Other Ambulatory Visit: Payer: Self-pay | Admitting: Internal Medicine

## 2011-03-30 DIAGNOSIS — Z1231 Encounter for screening mammogram for malignant neoplasm of breast: Secondary | ICD-10-CM

## 2011-04-12 ENCOUNTER — Ambulatory Visit
Admission: RE | Admit: 2011-04-12 | Discharge: 2011-04-12 | Disposition: A | Discharge status: Home / Self Care | Payer: BC Managed Care – PPO | Source: Ambulatory Visit | Attending: Internal Medicine | Admitting: Internal Medicine

## 2011-04-12 ENCOUNTER — Other Ambulatory Visit: Payer: Self-pay | Admitting: Internal Medicine

## 2011-04-12 DIAGNOSIS — Z1231 Encounter for screening mammogram for malignant neoplasm of breast: Secondary | ICD-10-CM

## 2011-04-12 DIAGNOSIS — N63 Unspecified lump in unspecified breast: Secondary | ICD-10-CM

## 2011-04-23 ENCOUNTER — Other Ambulatory Visit: Payer: Self-pay | Admitting: General Surgery

## 2011-04-23 DIAGNOSIS — N6315 Unspecified lump in the right breast, overlapping quadrants: Secondary | ICD-10-CM

## 2011-04-23 DIAGNOSIS — Z780 Asymptomatic menopausal state: Secondary | ICD-10-CM

## 2011-04-27 ENCOUNTER — Other Ambulatory Visit: Payer: BC Managed Care – PPO

## 2011-05-01 ENCOUNTER — Other Ambulatory Visit: Payer: Self-pay | Admitting: Endocrinology

## 2011-05-01 ENCOUNTER — Ambulatory Visit
Admission: RE | Admit: 2011-05-01 | Discharge: 2011-05-01 | Disposition: A | Discharge status: Home / Self Care | Payer: BC Managed Care – PPO | Source: Ambulatory Visit | Attending: General Surgery | Admitting: General Surgery

## 2011-05-01 DIAGNOSIS — Z780 Asymptomatic menopausal state: Secondary | ICD-10-CM

## 2011-05-01 DIAGNOSIS — N6315 Unspecified lump in the right breast, overlapping quadrants: Secondary | ICD-10-CM

## 2011-05-24 LAB — DIFFERENTIAL
Basophils Absolute: 0
Basophils Absolute: 0
Basophils Relative: 0
Basophils Relative: 1
Eosinophils Absolute: 0
Eosinophils Absolute: 0
Eosinophils Relative: 0
Eosinophils Relative: 0
Lymphocytes Relative: 10 — ABNORMAL LOW
Lymphocytes Relative: 13
Lymphs Abs: 0.6 — ABNORMAL LOW
Lymphs Abs: 1
Monocytes Absolute: 0.2
Monocytes Absolute: 0.5
Monocytes Relative: 5
Monocytes Relative: 5
Neutro Abs: 4.2
Neutro Abs: 8.2 — ABNORMAL HIGH
Neutrophils Relative %: 82 — ABNORMAL HIGH
Neutrophils Relative %: 84 — ABNORMAL HIGH

## 2011-05-24 LAB — BASIC METABOLIC PANEL
BUN: 46 — ABNORMAL HIGH
BUN: 46 — ABNORMAL HIGH
BUN: 62 — ABNORMAL HIGH
BUN: 84 — ABNORMAL HIGH
BUN: 99 — ABNORMAL HIGH
CO2: 11 — ABNORMAL LOW
CO2: 12 — ABNORMAL LOW
CO2: 26
CO2: 38 — ABNORMAL HIGH
CO2: 39 — ABNORMAL HIGH
Calcium: 7.1 — ABNORMAL LOW
Calcium: 7.1 — ABNORMAL LOW
Calcium: 7.2 — ABNORMAL LOW
Calcium: 7.4 — ABNORMAL LOW
Calcium: 8.2 — ABNORMAL LOW
Chloride: 109
Chloride: 115 — ABNORMAL HIGH
Chloride: 122 — ABNORMAL HIGH
Chloride: 98
Chloride: 98
Creatinine, Ser: 1.34 — ABNORMAL HIGH
Creatinine, Ser: 1.34 — ABNORMAL HIGH
Creatinine, Ser: 1.87 — ABNORMAL HIGH
Creatinine, Ser: 3.44 — ABNORMAL HIGH
Creatinine, Ser: 4.68 — ABNORMAL HIGH
GFR calc Af Amer: 11 — ABNORMAL LOW
GFR calc Af Amer: 16 — ABNORMAL LOW
GFR calc Af Amer: 33 — ABNORMAL LOW
GFR calc Af Amer: 49 — ABNORMAL LOW
GFR calc Af Amer: 49 — ABNORMAL LOW
GFR calc non Af Amer: 14 — ABNORMAL LOW
GFR calc non Af Amer: 27 — ABNORMAL LOW
GFR calc non Af Amer: 40 — ABNORMAL LOW
GFR calc non Af Amer: 40 — ABNORMAL LOW
GFR calc non Af Amer: 9 — ABNORMAL LOW
Glucose, Bld: 111 — ABNORMAL HIGH
Glucose, Bld: 130 — ABNORMAL HIGH
Glucose, Bld: 137 — ABNORMAL HIGH
Glucose, Bld: 158 — ABNORMAL HIGH
Glucose, Bld: 50 — ABNORMAL LOW
Potassium: 3.2 — ABNORMAL LOW
Potassium: 3.2 — ABNORMAL LOW
Potassium: 3.4 — ABNORMAL LOW
Potassium: 4.4
Potassium: 5.3 — ABNORMAL HIGH
Sodium: 137
Sodium: 139
Sodium: 140
Sodium: 143
Sodium: 145

## 2011-05-24 LAB — MAGNESIUM: Magnesium: 2.4

## 2011-05-24 LAB — CLOSTRIDIUM DIFFICILE EIA
C difficile Toxins A+B, EIA: NEGATIVE
C difficile Toxins A+B, EIA: NEGATIVE
C difficile Toxins A+B, EIA: NEGATIVE

## 2011-05-24 LAB — CBC
HCT: 23.6 — ABNORMAL LOW
HCT: 24.4 — ABNORMAL LOW
HCT: 24.5 — ABNORMAL LOW
HCT: 26 — ABNORMAL LOW
HCT: 34.9 — ABNORMAL LOW
Hemoglobin: 11.8 — ABNORMAL LOW
Hemoglobin: 8.2 — ABNORMAL LOW
Hemoglobin: 8.3 — ABNORMAL LOW
Hemoglobin: 8.4 — ABNORMAL LOW
Hemoglobin: 8.9 — ABNORMAL LOW
MCHC: 33.7
MCHC: 34
MCHC: 34.2
MCHC: 34.3
MCHC: 34.8
MCV: 82.8
MCV: 83.5
MCV: 84.9
MCV: 85.3
MCV: 85.3
Platelets: 193
Platelets: 219
Platelets: 232
Platelets: 235
Platelets: 336
RBC: 2.85 — ABNORMAL LOW
RBC: 2.86 — ABNORMAL LOW
RBC: 2.94 — ABNORMAL LOW
RBC: 3.06 — ABNORMAL LOW
RBC: 4.1
RDW: 15.5
RDW: 15.5
RDW: 15.8 — ABNORMAL HIGH
RDW: 15.9 — ABNORMAL HIGH
RDW: 16 — ABNORMAL HIGH
WBC: 3.7 — ABNORMAL LOW
WBC: 4.5
WBC: 5.1
WBC: 5.9
WBC: 9.8

## 2011-05-24 LAB — URINE CULTURE
Colony Count: NO GROWTH
Culture: NO GROWTH

## 2011-05-24 LAB — RENAL FUNCTION PANEL
Albumin: 2.5 — ABNORMAL LOW
Albumin: 2.6 — ABNORMAL LOW
BUN: 36 — ABNORMAL HIGH
BUN: 46 — ABNORMAL HIGH
CO2: 37 — ABNORMAL HIGH
CO2: 39 — ABNORMAL HIGH
Calcium: 7.1 — ABNORMAL LOW
Calcium: 7.2 — ABNORMAL LOW
Chloride: 103
Chloride: 99
Creatinine, Ser: 1.2
Creatinine, Ser: 1.38 — ABNORMAL HIGH
GFR calc Af Amer: 47 — ABNORMAL LOW
GFR calc Af Amer: 55 — ABNORMAL LOW
GFR calc non Af Amer: 39 — ABNORMAL LOW
GFR calc non Af Amer: 46 — ABNORMAL LOW
Glucose, Bld: 114 — ABNORMAL HIGH
Glucose, Bld: 82
Phosphorus: 2 — ABNORMAL LOW
Phosphorus: 2.2 — ABNORMAL LOW
Potassium: 3.4 — ABNORMAL LOW
Potassium: 3.9
Sodium: 144
Sodium: 145

## 2011-05-24 LAB — RETICULOCYTES
RBC.: 3.23 — ABNORMAL LOW
Retic Count, Absolute: 19.4
Retic Ct Pct: 0.6

## 2011-05-24 LAB — CK TOTAL AND CKMB (NOT AT ARMC)
CK, MB: 3.6
Relative Index: INVALID
Total CK: 37

## 2011-05-24 LAB — BLOOD GAS, ARTERIAL
Acid-base deficit: 13.1 — ABNORMAL HIGH
Bicarbonate: 12.1 — ABNORMAL LOW
Drawn by: 295031
O2 Saturation: 98.1
Patient temperature: 98.6
TCO2: 11.4
pCO2 arterial: 26.6 — ABNORMAL LOW
pH, Arterial: 7.279 — ABNORMAL LOW
pO2, Arterial: 110 — ABNORMAL HIGH

## 2011-05-24 LAB — EHEC TOXIN BY EIA, STOOL: EHEC Toxin by EIA: NEGATIVE

## 2011-05-24 LAB — COMPREHENSIVE METABOLIC PANEL
ALT: 13
AST: 11
Albumin: 4.2
Alkaline Phosphatase: 93
BUN: 119 — ABNORMAL HIGH
CO2: 9 — CL
Calcium: 9
Chloride: 115 — ABNORMAL HIGH
Creatinine, Ser: 5.72 — ABNORMAL HIGH
GFR calc Af Amer: 9 — ABNORMAL LOW
GFR calc non Af Amer: 8 — ABNORMAL LOW
Glucose, Bld: 167 — ABNORMAL HIGH
Potassium: 7.5
Sodium: 135
Total Bilirubin: 0.9
Total Protein: 7

## 2011-05-24 LAB — PHOSPHORUS: Phosphorus: 3.8

## 2011-05-24 LAB — STOOL CULTURE

## 2011-05-24 LAB — FOLATE: Folate: >20

## 2011-05-24 LAB — FECAL LACTOFERRIN, QUANT: Fecal Lactoferrin: NEGATIVE

## 2011-05-24 LAB — CULTURE, BLOOD (ROUTINE X 2)
Culture: NO GROWTH
Culture: NO GROWTH

## 2011-05-24 LAB — CARDIAC PANEL(CRET KIN+CKTOT+MB+TROPI)
CK, MB: 2.1
CK, MB: 3.5
Relative Index: INVALID
Relative Index: INVALID
Total CK: 27
Total CK: 31
Troponin I: 0.03
Troponin I: 0.04

## 2011-05-24 LAB — HEMOGLOBIN A1C
Hgb A1c MFr Bld: 5.7
Mean Plasma Glucose: 126

## 2011-05-24 LAB — ALBUMIN: Albumin: 2.4 — ABNORMAL LOW

## 2011-05-24 LAB — TROPONIN I: Troponin I: 0.03

## 2011-05-24 LAB — URINE MICROSCOPIC-ADD ON

## 2011-05-24 LAB — URINALYSIS, ROUTINE W REFLEX MICROSCOPIC
Bilirubin Urine: NEGATIVE
Glucose, UA: NEGATIVE
Hgb urine dipstick: NEGATIVE
Ketones, ur: NEGATIVE
Leukocytes, UA: NEGATIVE
Nitrite: NEGATIVE
Protein, ur: 30 — AB
Specific Gravity, Urine: 1.012
Urobilinogen, UA: 0.2
pH: 5

## 2011-05-24 LAB — CREATININE, URINE, RANDOM: Creatinine, Urine: 184.9

## 2011-05-24 LAB — LACTIC ACID, PLASMA: Lactic Acid, Venous: 1

## 2011-05-24 LAB — SODIUM, URINE, RANDOM: Sodium, Ur: 27

## 2011-05-24 LAB — LIPASE, BLOOD: Lipase: 42

## 2011-05-24 LAB — FERRITIN: Ferritin: 457 — ABNORMAL HIGH (ref 10–291)

## 2011-05-24 LAB — IRON AND TIBC
Iron: 90
Saturation Ratios: 45
TIBC: 198 — ABNORMAL LOW
UIBC: 108

## 2011-05-24 LAB — VITAMIN B12: Vitamin B-12: 491 (ref 211–911)

## 2011-05-24 LAB — APTT: aPTT: 30

## 2011-05-24 LAB — OCCULT BLOOD X 1 CARD TO LAB, STOOL: Fecal Occult Bld: NEGATIVE

## 2011-05-24 LAB — CORTISOL: Cortisol, Plasma: 6.1

## 2011-05-24 LAB — PROTIME-INR
INR: 1.1
Prothrombin Time: 14.8

## 2011-05-31 LAB — BASIC METABOLIC PANEL
BUN: 1 — ABNORMAL LOW
BUN: 11
BUN: 5 — ABNORMAL LOW
CO2: 28
CO2: 29
CO2: 32
Calcium: 8.1 — ABNORMAL LOW
Calcium: 8.3 — ABNORMAL LOW
Calcium: 8.5
Chloride: 105
Chloride: 105
Chloride: 107
Creatinine, Ser: 0.79
Creatinine, Ser: 0.9
Creatinine, Ser: 0.93
GFR calc Af Amer: >60
GFR calc Af Amer: >60
GFR calc Af Amer: >60
GFR calc non Af Amer: >60
GFR calc non Af Amer: >60
GFR calc non Af Amer: >60
Glucose, Bld: 130 — ABNORMAL HIGH
Glucose, Bld: 161 — ABNORMAL HIGH
Glucose, Bld: 175 — ABNORMAL HIGH
Potassium: 2.9 — ABNORMAL LOW
Potassium: 3.7
Potassium: 3.8
Sodium: 137
Sodium: 142
Sodium: 142

## 2011-05-31 LAB — URINALYSIS, ROUTINE W REFLEX MICROSCOPIC
Bilirubin Urine: NEGATIVE
Glucose, UA: NEGATIVE
Hgb urine dipstick: NEGATIVE
Ketones, ur: NEGATIVE
Nitrite: NEGATIVE
Protein, ur: NEGATIVE
Specific Gravity, Urine: 1.026
Urobilinogen, UA: 0.2
pH: 5.5

## 2011-05-31 LAB — CBC
HCT: 31.1 — ABNORMAL LOW
HCT: 31.9 — ABNORMAL LOW
HCT: 39.5
Hemoglobin: 10.5 — ABNORMAL LOW
Hemoglobin: 10.8 — ABNORMAL LOW
Hemoglobin: 13.5
MCHC: 33.6
MCHC: 33.9
MCHC: 34.1
MCV: 89.8
MCV: 91.5
MCV: 92.5
Platelets: 203
Platelets: 251
Platelets: 283
RBC: 3.37 — ABNORMAL LOW
RBC: 3.49 — ABNORMAL LOW
RBC: 4.4
RDW: 13.6
RDW: 13.7
RDW: 14
WBC: 10
WBC: 5.6
WBC: 7.9

## 2011-05-31 LAB — DIFFERENTIAL
Basophils Absolute: 0
Basophils Relative: 0
Eosinophils Absolute: 0.1
Eosinophils Relative: 1
Lymphocytes Relative: 19
Lymphs Abs: 1.5
Monocytes Absolute: 0.6
Monocytes Relative: 8
Neutro Abs: 5.6
Neutrophils Relative %: 72

## 2011-05-31 LAB — COMPREHENSIVE METABOLIC PANEL
ALT: 20
AST: 16
Albumin: 4
Alkaline Phosphatase: 138 — ABNORMAL HIGH
BUN: 26 — ABNORMAL HIGH
CO2: 26
Calcium: 10.4
Chloride: 104
Creatinine, Ser: 0.86
GFR calc Af Amer: >60
GFR calc non Af Amer: >60
Glucose, Bld: 160 — ABNORMAL HIGH
Potassium: 4.7
Sodium: 139
Total Bilirubin: 1.3 — ABNORMAL HIGH
Total Protein: 7.1

## 2011-06-11 LAB — DIFFERENTIAL
Basophils Absolute: 0
Basophils Absolute: 0
Basophils Absolute: 0
Basophils Absolute: 0
Basophils Absolute: 0
Basophils Relative: 0
Basophils Relative: 1
Basophils Relative: 1
Basophils Relative: 1
Basophils Relative: 1
Eosinophils Absolute: 0 — ABNORMAL LOW
Eosinophils Absolute: 0.1 — ABNORMAL LOW
Eosinophils Absolute: 0.2
Eosinophils Absolute: 0.2
Eosinophils Absolute: 0.2
Eosinophils Relative: 0
Eosinophils Relative: 3
Eosinophils Relative: 3
Eosinophils Relative: 3
Eosinophils Relative: 4
Lymphocytes Relative: 14
Lymphocytes Relative: 23
Lymphocytes Relative: 27
Lymphocytes Relative: 29
Lymphocytes Relative: 30
Lymphs Abs: 1.2
Lymphs Abs: 1.4
Lymphs Abs: 1.4
Lymphs Abs: 1.5
Lymphs Abs: 1.5
Monocytes Absolute: 0.5
Monocytes Absolute: 0.5
Monocytes Absolute: 0.5
Monocytes Absolute: 0.6
Monocytes Absolute: 0.6
Monocytes Relative: 10
Monocytes Relative: 11
Monocytes Relative: 11
Monocytes Relative: 7
Monocytes Relative: 9
Neutro Abs: 2.7
Neutro Abs: 2.8
Neutro Abs: 3.2
Neutro Abs: 3.8
Neutro Abs: 6.8
Neutrophils Relative %: 56
Neutrophils Relative %: 56
Neutrophils Relative %: 60
Neutrophils Relative %: 64
Neutrophils Relative %: 79 — ABNORMAL HIGH

## 2011-06-11 LAB — URINALYSIS, ROUTINE W REFLEX MICROSCOPIC
Bilirubin Urine: NEGATIVE
Glucose, UA: NEGATIVE
Hgb urine dipstick: NEGATIVE
Ketones, ur: NEGATIVE
Nitrite: NEGATIVE
Protein, ur: NEGATIVE
Specific Gravity, Urine: 1.017
Urobilinogen, UA: 0.2
pH: 5

## 2011-06-11 LAB — CBC
HCT: 25.8 — ABNORMAL LOW
HCT: 26.1 — ABNORMAL LOW
HCT: 26.3 — ABNORMAL LOW
HCT: 26.6 — ABNORMAL LOW
HCT: 32.3 — ABNORMAL LOW
Hemoglobin: 10.9 — ABNORMAL LOW
Hemoglobin: 8.7 — ABNORMAL LOW
Hemoglobin: 8.7 — ABNORMAL LOW
Hemoglobin: 8.8 — ABNORMAL LOW
Hemoglobin: 8.8 — ABNORMAL LOW
MCHC: 33.2
MCHC: 33.3
MCHC: 33.3
MCHC: 33.6
MCHC: 33.8
MCV: 86.5
MCV: 87.1
MCV: 87.1
MCV: 87.6
MCV: 87.6
Platelets: 225
Platelets: 240
Platelets: 248
Platelets: 269
Platelets: 357
RBC: 2.97 — ABNORMAL LOW
RBC: 3 — ABNORMAL LOW
RBC: 3.01 — ABNORMAL LOW
RBC: 3.03 — ABNORMAL LOW
RBC: 3.74 — ABNORMAL LOW
RDW: 14.5
RDW: 14.6
RDW: 14.7
RDW: 14.7
RDW: 15.1
WBC: 4.9
WBC: 5
WBC: 5.3
WBC: 5.9
WBC: 8.6

## 2011-06-11 LAB — BASIC METABOLIC PANEL
BUN: 10
BUN: 20
BUN: 48 — ABNORMAL HIGH
BUN: 7
CO2: 16 — ABNORMAL LOW
CO2: 18 — ABNORMAL LOW
CO2: 21
CO2: 22
Calcium: 7.8 — ABNORMAL LOW
Calcium: 7.9 — ABNORMAL LOW
Calcium: 8.4
Calcium: 8.7
Chloride: 115 — ABNORMAL HIGH
Chloride: 118 — ABNORMAL HIGH
Chloride: 120 — ABNORMAL HIGH
Chloride: 124 — ABNORMAL HIGH
Creatinine, Ser: 0.8
Creatinine, Ser: 0.8
Creatinine, Ser: 0.88
Creatinine, Ser: 1.32 — ABNORMAL HIGH
GFR calc Af Amer: 50 — ABNORMAL LOW
GFR calc Af Amer: >60
GFR calc Af Amer: >60
GFR calc Af Amer: >60
GFR calc non Af Amer: 41 — ABNORMAL LOW
GFR calc non Af Amer: >60
GFR calc non Af Amer: >60
GFR calc non Af Amer: >60
Glucose, Bld: 106 — ABNORMAL HIGH
Glucose, Bld: 107 — ABNORMAL HIGH
Glucose, Bld: 74
Glucose, Bld: 87
Potassium: 3.8
Potassium: 3.9
Potassium: 4.1
Potassium: 4.7
Sodium: 140
Sodium: 142
Sodium: 143
Sodium: 145

## 2011-06-11 LAB — COMPREHENSIVE METABOLIC PANEL
ALT: 12
AST: 12
Albumin: 3.9
Alkaline Phosphatase: 88
BUN: 66 — ABNORMAL HIGH
CO2: 16 — ABNORMAL LOW
Calcium: 9.6
Chloride: 112
Creatinine, Ser: 1.93 — ABNORMAL HIGH
GFR calc Af Amer: 32 — ABNORMAL LOW
GFR calc non Af Amer: 26 — ABNORMAL LOW
Glucose, Bld: 143 — ABNORMAL HIGH
Potassium: 6.1 — ABNORMAL HIGH
Sodium: 135
Total Bilirubin: 0.7
Total Protein: 7.1

## 2011-06-11 LAB — STOOL CULTURE

## 2011-06-11 LAB — FECAL LACTOFERRIN, QUANT: Fecal Lactoferrin: NEGATIVE

## 2011-06-11 LAB — GIARDIA/CRYPTOSPORIDIUM SCREEN(EIA)
Cryptosporidium Screen (EIA): NEGATIVE
Giardia Screen - EIA: NEGATIVE

## 2011-06-11 LAB — LIPASE, BLOOD: Lipase: 18

## 2011-06-11 LAB — PHOSPHORUS: Phosphorus: 5.2 — ABNORMAL HIGH

## 2011-06-11 LAB — CLOSTRIDIUM DIFFICILE EIA: C difficile Toxins A+B, EIA: NEGATIVE

## 2011-06-11 LAB — PROTIME-INR
INR: 1.2
Prothrombin Time: 15

## 2011-06-11 LAB — MAGNESIUM: Magnesium: 2.1

## 2011-06-12 LAB — BASIC METABOLIC PANEL
BUN: 12
BUN: 26 — ABNORMAL HIGH
BUN: 3 — ABNORMAL LOW
BUN: 3 — ABNORMAL LOW
BUN: 4 — ABNORMAL LOW
CO2: 24
CO2: 28
CO2: 29
CO2: 29
CO2: 33 — ABNORMAL HIGH
Calcium: 8.3 — ABNORMAL LOW
Calcium: 8.3 — ABNORMAL LOW
Calcium: 8.4
Calcium: 8.4
Calcium: 8.9
Chloride: 102
Chloride: 103
Chloride: 104
Chloride: 106
Chloride: 97
Creatinine, Ser: 0.6
Creatinine, Ser: 0.62
Creatinine, Ser: 0.64
Creatinine, Ser: 0.84
Creatinine, Ser: 0.97
GFR calc Af Amer: >60
GFR calc Af Amer: >60
GFR calc Af Amer: >60
GFR calc Af Amer: >60
GFR calc Af Amer: >60
GFR calc non Af Amer: 59 — ABNORMAL LOW
GFR calc non Af Amer: >60
GFR calc non Af Amer: >60
GFR calc non Af Amer: >60
GFR calc non Af Amer: >60
Glucose, Bld: 140 — ABNORMAL HIGH
Glucose, Bld: 155 — ABNORMAL HIGH
Glucose, Bld: 179 — ABNORMAL HIGH
Glucose, Bld: 192 — ABNORMAL HIGH
Glucose, Bld: 227 — ABNORMAL HIGH
Potassium: 3.7
Potassium: 4
Potassium: 4.1
Potassium: 4.2
Potassium: 4.4
Sodium: 137
Sodium: 138
Sodium: 139
Sodium: 140
Sodium: 140

## 2011-06-12 LAB — CBC
HCT: 28.3 — ABNORMAL LOW
HCT: 28.7 — ABNORMAL LOW
HCT: 29.6 — ABNORMAL LOW
HCT: 31.5 — ABNORMAL LOW
HCT: 33.6 — ABNORMAL LOW
HCT: 34.8 — ABNORMAL LOW
Hemoglobin: 10 — ABNORMAL LOW
Hemoglobin: 10.6 — ABNORMAL LOW
Hemoglobin: 11.3 — ABNORMAL LOW
Hemoglobin: 11.7 — ABNORMAL LOW
Hemoglobin: 9.5 — ABNORMAL LOW
Hemoglobin: 9.6 — ABNORMAL LOW
MCHC: 33.5
MCHC: 33.5
MCHC: 33.6
MCHC: 33.6
MCHC: 33.7
MCHC: 33.7
MCV: 87.9
MCV: 88
MCV: 88
MCV: 88.1
MCV: 88.3
MCV: 88.9
Platelets: 474 — ABNORMAL HIGH
Platelets: 481 — ABNORMAL HIGH
Platelets: 568 — ABNORMAL HIGH
Platelets: 570 — ABNORMAL HIGH
Platelets: 612 — ABNORMAL HIGH
Platelets: 658 — ABNORMAL HIGH
RBC: 3.21 — ABNORMAL LOW
RBC: 3.26 — ABNORMAL LOW
RBC: 3.36 — ABNORMAL LOW
RBC: 3.54 — ABNORMAL LOW
RBC: 3.82 — ABNORMAL LOW
RBC: 3.94
RDW: 13.2
RDW: 13.4
RDW: 13.4
RDW: 13.5
RDW: 13.6
RDW: 13.7
WBC: 12.2 — ABNORMAL HIGH
WBC: 13.5 — ABNORMAL HIGH
WBC: 14.8 — ABNORMAL HIGH
WBC: 15.4 — ABNORMAL HIGH
WBC: 17.5 — ABNORMAL HIGH
WBC: 24.1 — ABNORMAL HIGH

## 2011-06-12 LAB — COMPREHENSIVE METABOLIC PANEL
ALT: 11
AST: 14
Albumin: 3.2 — ABNORMAL LOW
Alkaline Phosphatase: 81
BUN: 30 — ABNORMAL HIGH
CO2: 26
Calcium: 9.2
Chloride: 103
Creatinine, Ser: 2 — ABNORMAL HIGH
GFR calc Af Amer: 31 — ABNORMAL LOW
GFR calc non Af Amer: 25 — ABNORMAL LOW
Glucose, Bld: 141 — ABNORMAL HIGH
Potassium: 4.5
Sodium: 139
Total Bilirubin: 0.4
Total Protein: 6.8

## 2011-06-12 LAB — CROSSMATCH
ABO/RH(D): A POS
Antibody Screen: NEGATIVE

## 2011-06-12 LAB — DIFFERENTIAL
Basophils Absolute: 0
Basophils Absolute: 0.1
Basophils Relative: 0
Basophils Relative: 0
Eosinophils Absolute: 0.1
Eosinophils Absolute: 0.1
Eosinophils Relative: 0
Eosinophils Relative: 0
Lymphocytes Relative: 4 — ABNORMAL LOW
Lymphocytes Relative: 4 — ABNORMAL LOW
Lymphs Abs: 0.7
Lymphs Abs: 1
Monocytes Absolute: 1.1 — ABNORMAL HIGH
Monocytes Absolute: 1.3 — ABNORMAL HIGH
Monocytes Relative: 5
Monocytes Relative: 7
Neutro Abs: 15.6 — ABNORMAL HIGH
Neutro Abs: 21.7 — ABNORMAL HIGH
Neutrophils Relative %: 89 — ABNORMAL HIGH
Neutrophils Relative %: 90 — ABNORMAL HIGH

## 2011-06-12 LAB — PROTIME-INR
INR: 1.3
Prothrombin Time: 16.3 — ABNORMAL HIGH

## 2011-06-12 LAB — URINALYSIS, ROUTINE W REFLEX MICROSCOPIC
Glucose, UA: NEGATIVE
Hgb urine dipstick: NEGATIVE
Ketones, ur: 15 — AB
Nitrite: NEGATIVE
Protein, ur: NEGATIVE
Specific Gravity, Urine: 1.017
Urobilinogen, UA: 0.2
pH: 5

## 2011-06-12 LAB — ABO/RH: ABO/RH(D): A POS

## 2011-06-12 LAB — APTT: aPTT: 33

## 2011-06-12 LAB — LIPASE, BLOOD: Lipase: 13

## 2011-08-13 ENCOUNTER — Other Ambulatory Visit: Payer: Self-pay

## 2011-08-13 ENCOUNTER — Emergency Department (HOSPITAL_COMMUNITY): Payer: BC Managed Care – PPO

## 2011-08-13 ENCOUNTER — Emergency Department (HOSPITAL_COMMUNITY)
Admission: EM | Admit: 2011-08-13 | Discharge: 2011-08-13 | Disposition: A | Discharge status: Home / Self Care | Payer: BC Managed Care – PPO | Attending: Emergency Medicine | Admitting: Emergency Medicine

## 2011-08-13 ENCOUNTER — Encounter (HOSPITAL_COMMUNITY): Payer: Self-pay | Admitting: Emergency Medicine

## 2011-08-13 ENCOUNTER — Emergency Department (INDEPENDENT_AMBULATORY_CARE_PROVIDER_SITE_OTHER)
Admission: EM | Admit: 2011-08-13 | Discharge: 2011-08-13 | Disposition: A | Discharge status: Other Acute Inpatient Hospital | Payer: BC Managed Care – PPO | Source: Home / Self Care | Attending: Emergency Medicine | Admitting: Emergency Medicine

## 2011-08-13 DIAGNOSIS — E119 Type 2 diabetes mellitus without complications: Secondary | ICD-10-CM | POA: Insufficient documentation

## 2011-08-13 DIAGNOSIS — R579 Shock, unspecified: Secondary | ICD-10-CM

## 2011-08-13 DIAGNOSIS — R61 Generalized hyperhidrosis: Secondary | ICD-10-CM | POA: Insufficient documentation

## 2011-08-13 DIAGNOSIS — R079 Chest pain, unspecified: Secondary | ICD-10-CM

## 2011-08-13 DIAGNOSIS — I10 Essential (primary) hypertension: Secondary | ICD-10-CM | POA: Insufficient documentation

## 2011-08-13 DIAGNOSIS — Z794 Long term (current) use of insulin: Secondary | ICD-10-CM | POA: Insufficient documentation

## 2011-08-13 DIAGNOSIS — Z79899 Other long term (current) drug therapy: Secondary | ICD-10-CM | POA: Insufficient documentation

## 2011-08-13 HISTORY — DX: Essential (primary) hypertension: I10

## 2011-08-13 LAB — COMPREHENSIVE METABOLIC PANEL
ALT: 34 U/L (ref 0–35)
AST: 25 U/L (ref 0–37)
Albumin: 4.1 g/dL (ref 3.5–5.2)
Alkaline Phosphatase: 76 U/L (ref 39–117)
BUN: 38 mg/dL — ABNORMAL HIGH (ref 6–23)
CO2: 23 mEq/L (ref 19–32)
Calcium: 9.9 mg/dL (ref 8.4–10.5)
Chloride: 105 mEq/L (ref 96–112)
Creatinine, Ser: 1.3 mg/dL — ABNORMAL HIGH (ref 0.50–1.10)
GFR calc Af Amer: 49 mL/min — ABNORMAL LOW (ref >90)
GFR calc non Af Amer: 42 mL/min — ABNORMAL LOW (ref >90)
Glucose, Bld: 72 mg/dL (ref 70–99)
Potassium: 4 mEq/L (ref 3.5–5.1)
Sodium: 140 mEq/L (ref 135–145)
Total Bilirubin: 0.2 mg/dL — ABNORMAL LOW (ref 0.3–1.2)
Total Protein: 7.4 g/dL (ref 6.0–8.3)

## 2011-08-13 LAB — DIFFERENTIAL
Basophils Absolute: 0 10*3/uL (ref 0.0–0.1)
Basophils Relative: 0 % (ref 0–1)
Eosinophils Absolute: 0 10*3/uL (ref 0.0–0.7)
Eosinophils Relative: 0 % (ref 0–5)
Lymphocytes Relative: 17 % (ref 12–46)
Lymphs Abs: 1.5 10*3/uL (ref 0.7–4.0)
Monocytes Absolute: 0.6 10*3/uL (ref 0.1–1.0)
Monocytes Relative: 6 % (ref 3–12)
Neutro Abs: 7 10*3/uL (ref 1.7–7.7)
Neutrophils Relative %: 76 % (ref 43–77)

## 2011-08-13 LAB — GLUCOSE, CAPILLARY: Glucose-Capillary: 95 mg/dL (ref 70–99)

## 2011-08-13 LAB — CBC
HCT: 38.3 % (ref 36.0–46.0)
Hemoglobin: 12.7 g/dL (ref 12.0–15.0)
MCH: 30.5 pg (ref 26.0–34.0)
MCHC: 33.2 g/dL (ref 30.0–36.0)
MCV: 92.1 fL (ref 78.0–100.0)
Platelets: 272 10*3/uL (ref 150–400)
RBC: 4.16 MIL/uL (ref 3.87–5.11)
RDW: 13.4 % (ref 11.5–15.5)
WBC: 9.2 10*3/uL (ref 4.0–10.5)

## 2011-08-13 LAB — TROPONIN I
Troponin I: <0.3 ng/mL (ref <0.30)
Troponin I: <0.3 ng/mL (ref <0.30)

## 2011-08-13 LAB — D-DIMER, QUANTITATIVE: D-Dimer, Quant: 0.28 ug/mL-FEU (ref 0.00–0.48)

## 2011-08-13 MED ORDER — SODIUM CHLORIDE 0.45 % IV SOLN
INTRAVENOUS | Status: DC
  Administered 2011-08-13: 10 mL via INTRAVENOUS
  Administered 2011-08-13: 50 mL via INTRAVENOUS

## 2011-08-13 MED ORDER — NITROGLYCERIN 0.4 MG SL SUBL: 0.4000 mg | SUBLINGUAL_TABLET | SUBLINGUAL | Status: DC | PRN

## 2011-08-13 NOTE — ED Provider Notes (Signed)
History     CSN: [id number] Arrival date & time: 08/13/2011  3:47 PM   First MD Initiated Contact with Patient 08/13/11 1554      Chief Complaint  Patient presents with  . Chest Pain    (Consider location/radiation/quality/duration/timing/severity/associated sxs/prior treatment) HPI Comments: Mrs. Wooding is a 65 year old female with diabetes and hypertension. Today while shopping she developed this first began in the left lower quadrant of the abdomen but then quickly radiated up into the left hemithorax both anteriorly and posteriorly, but not down the arm or into the neck. This was a severe pain rated 8/10 in intensity associated with diaphoresis and dizziness. She denies any shortness of breath or nausea. She has had no prior cardiac history. She is on insulin for her diabetes. She denies any fever, chills, coughing, wheezing, palpitations, syncope, abdominal distention, diarrhea, constipation, or blood in the stool. She has had no history of hiatal hernia, reflux, or ulcer disease.  Patient is a 65 y.o. female presenting with chest pain.  Chest Pain Primary symptoms include abdominal pain and dizziness. Pertinent negatives for primary symptoms include no fever, no shortness of breath, no cough, no wheezing, no palpitations, no nausea and no vomiting.  Dizziness also occurs with diaphoresis. Dizziness does not occur with nausea or vomiting.   Associated symptoms include diaphoresis.     Past Medical History  Diagnosis Date  . Diabetes mellitus   . Hypertension     Past Surgical History  Procedure Date  . Colon surgery     History reviewed. No pertinent family history.  History  Substance Use Topics  . Smoking status: Current Everyday Smoker  . Smokeless tobacco: Not on file  . Alcohol Use: No    OB History    Grav Para Term Preterm Abortions TAB SAB Ect Mult Living                  Review of Systems  Constitutional: Positive for diaphoresis. Negative for fever  and chills.  Respiratory: Negative for cough, shortness of breath and wheezing.   Cardiovascular: Positive for chest pain. Negative for palpitations and leg swelling.  Gastrointestinal: Positive for abdominal pain. Negative for nausea and vomiting.  Neurological: Positive for dizziness.    Allergies  Review of patient's allergies indicates no known allergies.  Home Medications   Current Outpatient Rx  Name Route Sig Dispense Refill  . INSULIN GLARGINE 100 UNIT/ML [place] SOLN Subcutaneous Inject into the skin at bedtime.      . INSULIN LISPRO (HUMAN) 100 UNIT/ML [place] SOLN Subcutaneous Inject into the skin 3 (three) times daily before meals.      [facility] LISINOPRIL-HYDROCHLOROTHIAZIDE 20-12.5 MG PO TABS Oral Take 1 tablet by mouth daily.        BP 140/69  Pulse 91  Temp(Src) 97.5 F (36.4 C) (Oral)  Resp 16  SpO2 100%  Physical Exam  Nursing note and vitals reviewed. Constitutional: She appears well-developed and well-nourished. No distress.  Cardiovascular: Normal rate, regular rhythm, S1 normal, S2 normal, intact distal pulses and normal pulses.   No extrasystoles are present. PMI is not displaced.  Exam reveals no gallop, no S3, no S4, no distant heart sounds and no friction rub.   No murmur heard. Pulmonary/Chest: Effort normal and breath sounds normal. No respiratory distress. She has no wheezes. She has no rales. She exhibits no tenderness.  Abdominal: Soft. Bowel sounds are normal. She exhibits no distension and no mass. There is no tenderness. There is no rebound  and no guarding.  Skin: Skin is warm and dry. No rash noted. She is not diaphoretic.    ED Course  Procedures (including critical care time)  Results for orders placed during the hospital encounter of 08/13/11  GLUCOSE, CAPILLARY      Component Value Range   Glucose-Capillary 95  70 - 99 (mg/dL)    Date: 16/06/9603  Rate: 74  Rhythm: normal sinus rhythm  QRS Axis: normal  Intervals: normal  ST/T Wave abnormalities:  normal  Conduction Disutrbances:none  Narrative Interpretation: Normal sinus rhythm, low voltage QRS, borderline EKG.  Old EKG Reviewed: none available    Labs Reviewed  GLUCOSE, CAPILLARY  POCT CBG MONITORING   No results found.   1. Chest pain       MDM  This lady is at high risk due to diabetes and hypertension and has suspicious chest pain despite a relatively normal EKG. We'll transfer her to her to the emergency room by EMS for evaluation.        Roque Lias, MD 08/13/11 [phone]

## 2011-08-13 NOTE — ED Notes (Signed)
States she was out doing errands when she had sudden onset of left sided and LLQ pain left chest pain and broke out in sweat and got dizzy ; states she also had pain LLQ; hist of crone's disease; Friday night she states she nauseated and had vomiting w diarrhea all night long; NAD at present; pale w/dry

## 2011-08-13 NOTE — ED Notes (Signed)
Pt reports she was out shopping and got a sudden onset of chest pain that was substernal.  Pt received asa pta.  Pt denies chest pain now at this time.

## 2011-08-13 NOTE — ED Provider Notes (Signed)
History     CSN: [id number] Arrival date & time: 08/13/2011  5:05 PM   First MD Initiated Contact with Patient 08/13/11 1706      Chief Complaint  Patient presents with  . Chest Pain    (Consider location/radiation/quality/duration/timing/severity/associated sxs/prior treatment) HPI At about 3 PM, patient had sudden onset of severe, sharp left-sided chest pain which was also present in the left lower abdomen. There is actually no radiation of the pain. There is associated diaphoresis, but no nausea or dyspnea. Nothing made the pain better, nothing made it worse. Pain came on after she had walked about 100 feet while shopping. She went to the [facility] and was transferred here for further evaluation. She did take 1 aspirin. Pain lasted about 15 minutes before resolving. She has not had any further pain since then. At this point, she has been pain-free for about 2 hours. She does have a history of diabetes and hypertension both of which are well controlled. She does smoke one pack of cigarettes a week. She has never had pain like this before. Past Medical History  Diagnosis Date  . Diabetes mellitus   . Hypertension     Past Surgical History  Procedure Date  . Colon surgery     No family history on file.  History  Substance Use Topics  . Smoking status: Current Everyday Smoker  . Smokeless tobacco: Not on file  . Alcohol Use: No    OB History    Grav Para Term Preterm Abortions TAB SAB Ect Mult Living                  Review of Systems  Allergies  Review of patient's allergies indicates no known allergies.  Home Medications   Current Outpatient Rx  Name Route Sig Dispense Refill  . INSULIN GLARGINE 100 UNIT/ML [place] SOLN Subcutaneous Inject into the skin at bedtime.     . INSULIN LISPRO (HUMAN) 100 UNIT/ML [place] SOLN Subcutaneous Inject into the skin 3 (three) times daily before meals.     Marland Kitchen LISINOPRIL-HYDROCHLOROTHIAZIDE 20-12.5 MG PO TABS Oral Take  1 tablet by mouth daily.        BP 172/79  Pulse 97  Temp(Src) 97.8 F (36.6 C) (Oral)  Resp 18  SpO2 99%  Physical Exam 65 year old female who is resting comfortably and in no acute distress. Vital signs are normal. Head is normocephalic and atraumatic. PERRLA, EOMI. Oropharynx is clear. Neck is supple without adenopathy or JVD. Back is nontender. Lungs are clear without rales, wheezes, rhonchi. Heart has regular rate and rhythm without murmur. There is no chest wall tenderness. Abdomen is soft, flat, nontender without masses or hepatosplenomegaly. Extremities have no cyanosis or edema, full range of motion is present. Skin is warm and moist without rash. Neurologic: Mental status is normal, cranial nerves are intact, there are no motor or sensory deficits. Psychiatric: No abnormalities of mood or affect. ED Course  Procedures (including critical care time)   Labs Reviewed  CBC  DIFFERENTIAL  COMPREHENSIVE METABOLIC PANEL  TROPONIN I  TROPONIN I  D-DIMER, QUANTITATIVE   No results found. Results for orders placed during the hospital encounter of 08/13/11  CBC      Component Value Range   WBC 9.2  4.0 - 10.5 (K/uL)   RBC 4.16  3.87 - 5.11 (MIL/uL)   Hemoglobin 12.7  12.0 - 15.0 (g/dL)   HCT 40.9  81.1 - 91.4 (%)   MCV 92.1  78.0 - 100.0 (fL)   MCH 30.5  26.0 - 34.0 (pg)   MCHC 33.2  30.0 - 36.0 (g/dL)   RDW 45.4  09.8 - 11.9 (%)   Platelets 272  150 - 400 (K/uL)  DIFFERENTIAL      Component Value Range   Neutrophils Relative 76  43 - 77 (%)   Neutro Abs 7.0  1.7 - 7.7 (K/uL)   Lymphocytes Relative 17  12 - 46 (%)   Lymphs Abs 1.5  0.7 - 4.0 (K/uL)   Monocytes Relative 6  3 - 12 (%)   Monocytes Absolute 0.6  0.1 - 1.0 (K/uL)   Eosinophils Relative 0  0 - 5 (%)   Eosinophils Absolute 0.0  0.0 - 0.7 (K/uL)   Basophils Relative 0  0 - 1 (%)   Basophils Absolute 0.0  0.0 - 0.1 (K/uL)  COMPREHENSIVE METABOLIC PANEL      Component Value Range   Sodium 140  135 - 145  (mEq/L)   Potassium 4.0  3.5 - 5.1 (mEq/L)   Chloride 105  96 - 112 (mEq/L)   CO2 23  19 - 32 (mEq/L)   Glucose, Bld 72  70 - 99 (mg/dL)   BUN 38 (*) 6 - 23 (mg/dL)   Creatinine, Ser 1.47 (*) 0.50 - 1.10 (mg/dL)   Calcium 9.9  8.4 - 82.9 (mg/dL)   Total Protein 7.4  6.0 - 8.3 (g/dL)   Albumin 4.1  3.5 - 5.2 (g/dL)   AST 25  0 - 37 (U/L)   ALT 34  0 - 35 (U/L)   Alkaline Phosphatase 76  39 - 117 (U/L)   Total Bilirubin 0.2 (*) 0.3 - 1.2 (mg/dL)   GFR calc non Af Amer 42 (*) >90 (mL/min)   GFR calc Af Amer 49 (*) >90 (mL/min)  TROPONIN I      Component Value Range   Troponin I <0.30  <0.30 (ng/mL)  TROPONIN I      Component Value Range   Troponin I <0.30  <0.30 (ng/mL)  D-DIMER, QUANTITATIVE      Component Value Range   D-Dimer, Quant 0.28  0.00 - 0.48 (ug/mL-FEU)   Dg Chest 2 View  08/13/2011  *RADIOLOGY REPORT*  Clinical Data: Chest pain  CHEST - 2 VIEW  Comparison: Chest radiograph 06/06/2010  Findings: Normal heart, mediastinal, and hilar contours.  Normal pulmonary vascularity.  The lungs are normally expanded and clear. There is no pleural effusion.  Stable moderate compression fracture of a single vertebral body near the thoracolumbar junction, likely T12.  IMPRESSION:  1.  No acute cardiopulmonary disease. 2.  Stable compression deformity of a lower thoracic spine vertebral body.  Original Report Authenticated By: Britta Mccreedy, M.D.      No diagnosis found.  ECG shows normal sinus rhythm with a rate of 89, no ectopy. Normal axis. Normal P wave. Normal QRS. Normal intervals. Normal ST and T waves. Impression: normal ECG. When compared with ECG done 11/15/2009, no significant change is seen.  She has had no further chest pain while in the emergency department. 2 sets of cardiac markers are normal. No evidence of acute cardiac process. MDM  Chest pain-rule out cardiac etiology.        Dione Booze, MD 08/13/11 2121

## 2011-08-13 NOTE — ED Notes (Signed)
Pt. Discharged to home, pt. Alert and oriented, gait steady NAD noted,

## 2012-07-30 DIAGNOSIS — E1029 Type 1 diabetes mellitus with other diabetic kidney complication: Secondary | ICD-10-CM | POA: Diagnosis not present

## 2012-07-30 DIAGNOSIS — E7800 Pure hypercholesterolemia, unspecified: Secondary | ICD-10-CM | POA: Diagnosis not present

## 2012-07-30 DIAGNOSIS — I10 Essential (primary) hypertension: Secondary | ICD-10-CM | POA: Diagnosis not present

## 2012-12-05 DIAGNOSIS — E7800 Pure hypercholesterolemia, unspecified: Secondary | ICD-10-CM | POA: Diagnosis not present

## 2012-12-05 DIAGNOSIS — E1029 Type 1 diabetes mellitus with other diabetic kidney complication: Secondary | ICD-10-CM | POA: Diagnosis not present

## 2012-12-05 DIAGNOSIS — I10 Essential (primary) hypertension: Secondary | ICD-10-CM | POA: Diagnosis not present

## 2013-02-13 DIAGNOSIS — I10 Essential (primary) hypertension: Secondary | ICD-10-CM | POA: Diagnosis not present

## 2013-02-13 DIAGNOSIS — N183 Chronic kidney disease, stage 3 unspecified: Secondary | ICD-10-CM | POA: Diagnosis not present

## 2013-02-13 DIAGNOSIS — E1065 Type 1 diabetes mellitus with hyperglycemia: Secondary | ICD-10-CM | POA: Diagnosis not present

## 2013-02-13 DIAGNOSIS — E1029 Type 1 diabetes mellitus with other diabetic kidney complication: Secondary | ICD-10-CM | POA: Diagnosis not present

## 2013-02-13 DIAGNOSIS — E7800 Pure hypercholesterolemia, unspecified: Secondary | ICD-10-CM | POA: Diagnosis not present

## 2013-04-15 DIAGNOSIS — E1029 Type 1 diabetes mellitus with other diabetic kidney complication: Secondary | ICD-10-CM | POA: Diagnosis not present

## 2013-04-15 DIAGNOSIS — E7800 Pure hypercholesterolemia, unspecified: Secondary | ICD-10-CM | POA: Diagnosis not present

## 2013-04-15 DIAGNOSIS — I10 Essential (primary) hypertension: Secondary | ICD-10-CM | POA: Diagnosis not present

## 2013-05-18 DIAGNOSIS — I10 Essential (primary) hypertension: Secondary | ICD-10-CM | POA: Diagnosis not present

## 2013-05-18 DIAGNOSIS — N183 Chronic kidney disease, stage 3 unspecified: Secondary | ICD-10-CM | POA: Diagnosis not present

## 2013-05-18 DIAGNOSIS — E1029 Type 1 diabetes mellitus with other diabetic kidney complication: Secondary | ICD-10-CM | POA: Diagnosis not present

## 2013-05-18 DIAGNOSIS — E7800 Pure hypercholesterolemia, unspecified: Secondary | ICD-10-CM | POA: Diagnosis not present

## 2013-08-19 DIAGNOSIS — E1029 Type 1 diabetes mellitus with other diabetic kidney complication: Secondary | ICD-10-CM | POA: Diagnosis not present

## 2013-08-19 DIAGNOSIS — E7800 Pure hypercholesterolemia, unspecified: Secondary | ICD-10-CM | POA: Diagnosis not present

## 2013-08-19 DIAGNOSIS — I10 Essential (primary) hypertension: Secondary | ICD-10-CM | POA: Diagnosis not present

## 2013-08-19 DIAGNOSIS — J069 Acute upper respiratory infection, unspecified: Secondary | ICD-10-CM | POA: Diagnosis not present

## 2013-09-08 DIAGNOSIS — E7800 Pure hypercholesterolemia, unspecified: Secondary | ICD-10-CM | POA: Diagnosis not present

## 2013-09-08 DIAGNOSIS — N183 Chronic kidney disease, stage 3 unspecified: Secondary | ICD-10-CM | POA: Diagnosis not present

## 2013-09-08 DIAGNOSIS — I10 Essential (primary) hypertension: Secondary | ICD-10-CM | POA: Diagnosis not present

## 2013-09-08 DIAGNOSIS — E1029 Type 1 diabetes mellitus with other diabetic kidney complication: Secondary | ICD-10-CM | POA: Diagnosis not present

## 2013-12-30 DIAGNOSIS — I10 Essential (primary) hypertension: Secondary | ICD-10-CM | POA: Diagnosis not present

## 2013-12-30 DIAGNOSIS — E1065 Type 1 diabetes mellitus with hyperglycemia: Secondary | ICD-10-CM | POA: Diagnosis not present

## 2013-12-30 DIAGNOSIS — N183 Chronic kidney disease, stage 3 unspecified: Secondary | ICD-10-CM | POA: Diagnosis not present

## 2013-12-30 DIAGNOSIS — E7800 Pure hypercholesterolemia, unspecified: Secondary | ICD-10-CM | POA: Diagnosis not present

## 2013-12-30 DIAGNOSIS — E1029 Type 1 diabetes mellitus with other diabetic kidney complication: Secondary | ICD-10-CM | POA: Diagnosis not present

## 2014-02-17 DIAGNOSIS — I10 Essential (primary) hypertension: Secondary | ICD-10-CM | POA: Diagnosis not present

## 2014-02-17 DIAGNOSIS — E1029 Type 1 diabetes mellitus with other diabetic kidney complication: Secondary | ICD-10-CM | POA: Diagnosis not present

## 2014-04-07 DIAGNOSIS — I10 Essential (primary) hypertension: Secondary | ICD-10-CM | POA: Diagnosis not present

## 2014-04-07 DIAGNOSIS — E1065 Type 1 diabetes mellitus with hyperglycemia: Secondary | ICD-10-CM | POA: Diagnosis not present

## 2014-04-07 DIAGNOSIS — R9431 Abnormal electrocardiogram [ECG] [EKG]: Secondary | ICD-10-CM | POA: Diagnosis not present

## 2014-04-07 DIAGNOSIS — M899 Disorder of bone, unspecified: Secondary | ICD-10-CM | POA: Diagnosis not present

## 2014-04-07 DIAGNOSIS — E1029 Type 1 diabetes mellitus with other diabetic kidney complication: Secondary | ICD-10-CM | POA: Diagnosis not present

## 2014-04-07 DIAGNOSIS — E7800 Pure hypercholesterolemia, unspecified: Secondary | ICD-10-CM | POA: Diagnosis not present

## 2014-04-07 DIAGNOSIS — M949 Disorder of cartilage, unspecified: Secondary | ICD-10-CM | POA: Diagnosis not present

## 2014-04-08 ENCOUNTER — Telehealth (HOSPITAL_COMMUNITY): Payer: Self-pay | Admitting: *Deleted

## 2014-04-16 ENCOUNTER — Other Ambulatory Visit (HOSPITAL_COMMUNITY): Payer: Self-pay | Admitting: Internal Medicine

## 2014-04-16 DIAGNOSIS — R9431 Abnormal electrocardiogram [ECG] [EKG]: Secondary | ICD-10-CM

## 2014-04-20 ENCOUNTER — Telehealth (HOSPITAL_COMMUNITY): Payer: Self-pay

## 2014-04-20 NOTE — Telephone Encounter (Signed)
Encounter complete. 

## 2014-04-21 ENCOUNTER — Telehealth (HOSPITAL_COMMUNITY): Payer: Self-pay

## 2014-04-21 NOTE — Telephone Encounter (Signed)
Encounter complete. 

## 2014-04-22 ENCOUNTER — Ambulatory Visit (HOSPITAL_COMMUNITY)
Admission: RE | Admit: 2014-04-22 | Discharge: 2014-04-22 | Disposition: A | Discharge status: Home / Self Care | Payer: BC Managed Care – PPO | Source: Ambulatory Visit | Attending: Cardiology | Admitting: Cardiology

## 2014-04-22 DIAGNOSIS — R9431 Abnormal electrocardiogram [ECG] [EKG]: Secondary | ICD-10-CM

## 2014-04-22 DIAGNOSIS — I447 Left bundle-branch block, unspecified: Secondary | ICD-10-CM | POA: Diagnosis not present

## 2014-04-22 NOTE — Procedures (Signed)
Exercise Treadmill Test   Test  Exercise Tolerance Test Ordering MD: Thayer Headings, MD    Unique Test No: 1  Treadmill:  1  Indication for ETT: ABN EKG  Contraindication to ETT: No   Stress Modality: exercise - treadmill  Cardiac Imaging Performed: non   Protocol: standard Bruce - maximal  Max BP:  186/85  Max MPHR (bpm):  153 85% MPR (bpm):  130  MPHR obtained (bpm):  181 % MPHR obtained:  118  Reached 85% MPHR (min:sec):  1:25 Total Exercise Time (min-sec):  4  Workload in METS:  5.8 Borg Scale: 15  Reason ETT Terminated:  SOB and Fatigue LBBB   ST Segment Analysis At Rest: Normal sinus rhythm with LBBB With Exercise:  Sinus tachy with PAC's, there was a short run of PAT during the study   Other Information Arrhythmia:  Yes Angina during ETT:  absent (0) Quality of ETT:  non-diagnostic  ETT Interpretation:  Non-diagnostic stress test due to LBBB  Comments: Non-diagnostic stress test for ischemia due to LBBB - in patients with LBBB, lexiscan nuclear stress testing is recommended (ischemia cannot be ruled-out on the basis of this test)  There was a brief period of PAT noted during exercise and scattered PVC's.  Poor exercise capacity was noted  Chrystie Nose, MD, [facility] Attending Cardiologist [facility]

## 2014-05-23 DIAGNOSIS — J019 Acute sinusitis, unspecified: Secondary | ICD-10-CM | POA: Diagnosis not present

## 2014-05-23 DIAGNOSIS — J209 Acute bronchitis, unspecified: Secondary | ICD-10-CM | POA: Diagnosis not present

## 2014-06-25 DIAGNOSIS — J069 Acute upper respiratory infection, unspecified: Secondary | ICD-10-CM | POA: Diagnosis not present

## 2014-06-25 DIAGNOSIS — E109 Type 1 diabetes mellitus without complications: Secondary | ICD-10-CM | POA: Diagnosis not present

## 2014-08-03 DIAGNOSIS — I10 Essential (primary) hypertension: Secondary | ICD-10-CM | POA: Diagnosis not present

## 2014-08-03 DIAGNOSIS — E1065 Type 1 diabetes mellitus with hyperglycemia: Secondary | ICD-10-CM | POA: Diagnosis not present

## 2014-08-03 DIAGNOSIS — E1021 Type 1 diabetes mellitus with diabetic nephropathy: Secondary | ICD-10-CM | POA: Diagnosis not present

## 2014-08-03 DIAGNOSIS — E780 Pure hypercholesterolemia: Secondary | ICD-10-CM | POA: Diagnosis not present

## 2014-11-09 DIAGNOSIS — E1165 Type 2 diabetes mellitus with hyperglycemia: Secondary | ICD-10-CM | POA: Diagnosis not present

## 2014-11-09 DIAGNOSIS — I10 Essential (primary) hypertension: Secondary | ICD-10-CM | POA: Diagnosis not present

## 2014-11-09 DIAGNOSIS — E1122 Type 2 diabetes mellitus with diabetic chronic kidney disease: Secondary | ICD-10-CM | POA: Diagnosis not present

## 2014-11-09 DIAGNOSIS — E780 Pure hypercholesterolemia: Secondary | ICD-10-CM | POA: Diagnosis not present

## 2014-12-21 DIAGNOSIS — E780 Pure hypercholesterolemia: Secondary | ICD-10-CM | POA: Diagnosis not present

## 2014-12-21 DIAGNOSIS — E1065 Type 1 diabetes mellitus with hyperglycemia: Secondary | ICD-10-CM | POA: Diagnosis not present

## 2014-12-21 DIAGNOSIS — I10 Essential (primary) hypertension: Secondary | ICD-10-CM | POA: Diagnosis not present

## 2015-03-28 DIAGNOSIS — R05 Cough: Secondary | ICD-10-CM | POA: Diagnosis not present

## 2015-03-28 DIAGNOSIS — K5090 Crohn's disease, unspecified, without complications: Secondary | ICD-10-CM | POA: Diagnosis not present

## 2015-03-28 DIAGNOSIS — K219 Gastro-esophageal reflux disease without esophagitis: Secondary | ICD-10-CM | POA: Diagnosis not present

## 2015-03-28 DIAGNOSIS — E119 Type 2 diabetes mellitus without complications: Secondary | ICD-10-CM | POA: Diagnosis not present

## 2015-03-28 DIAGNOSIS — I10 Essential (primary) hypertension: Secondary | ICD-10-CM | POA: Diagnosis not present

## 2015-03-28 DIAGNOSIS — E785 Hyperlipidemia, unspecified: Secondary | ICD-10-CM | POA: Diagnosis not present

## 2015-04-20 DIAGNOSIS — R197 Diarrhea, unspecified: Secondary | ICD-10-CM | POA: Diagnosis not present

## 2015-04-20 DIAGNOSIS — K219 Gastro-esophageal reflux disease without esophagitis: Secondary | ICD-10-CM | POA: Diagnosis not present

## 2015-04-20 DIAGNOSIS — K5090 Crohn's disease, unspecified, without complications: Secondary | ICD-10-CM | POA: Diagnosis not present

## 2015-04-20 DIAGNOSIS — E119 Type 2 diabetes mellitus without complications: Secondary | ICD-10-CM | POA: Diagnosis not present

## 2015-06-16 DIAGNOSIS — N6489 Other specified disorders of breast: Secondary | ICD-10-CM | POA: Diagnosis not present

## 2015-06-16 DIAGNOSIS — I10 Essential (primary) hypertension: Secondary | ICD-10-CM | POA: Diagnosis not present

## 2015-06-16 DIAGNOSIS — J40 Bronchitis, not specified as acute or chronic: Secondary | ICD-10-CM | POA: Diagnosis not present

## 2015-06-21 DIAGNOSIS — J069 Acute upper respiratory infection, unspecified: Secondary | ICD-10-CM | POA: Diagnosis not present

## 2015-06-21 DIAGNOSIS — R0781 Pleurodynia: Secondary | ICD-10-CM | POA: Diagnosis not present

## 2015-06-21 DIAGNOSIS — R0789 Other chest pain: Secondary | ICD-10-CM | POA: Diagnosis not present

## 2015-06-21 DIAGNOSIS — R05 Cough: Secondary | ICD-10-CM | POA: Diagnosis not present

## 2015-07-31 DIAGNOSIS — I63412 Cerebral infarction due to embolism of left middle cerebral artery: Secondary | ICD-10-CM | POA: Diagnosis not present

## 2015-07-31 DIAGNOSIS — I6521 Occlusion and stenosis of right carotid artery: Secondary | ICD-10-CM | POA: Diagnosis not present

## 2015-07-31 DIAGNOSIS — K5090 Crohn's disease, unspecified, without complications: Secondary | ICD-10-CM | POA: Diagnosis not present

## 2015-07-31 DIAGNOSIS — I509 Heart failure, unspecified: Secondary | ICD-10-CM | POA: Diagnosis not present

## 2015-07-31 DIAGNOSIS — I638 Other cerebral infarction: Secondary | ICD-10-CM | POA: Diagnosis not present

## 2015-07-31 DIAGNOSIS — I080 Rheumatic disorders of both mitral and aortic valves: Secondary | ICD-10-CM | POA: Diagnosis not present

## 2015-07-31 DIAGNOSIS — G8191 Hemiplegia, unspecified affecting right dominant side: Secondary | ICD-10-CM | POA: Diagnosis not present

## 2015-07-31 DIAGNOSIS — Z823 Family history of stroke: Secondary | ICD-10-CM | POA: Diagnosis not present

## 2015-07-31 DIAGNOSIS — M6281 Muscle weakness (generalized): Secondary | ICD-10-CM | POA: Diagnosis not present

## 2015-07-31 DIAGNOSIS — I10 Essential (primary) hypertension: Secondary | ICD-10-CM | POA: Diagnosis not present

## 2015-07-31 DIAGNOSIS — E119 Type 2 diabetes mellitus without complications: Secondary | ICD-10-CM | POA: Diagnosis not present

## 2015-07-31 DIAGNOSIS — F17210 Nicotine dependence, cigarettes, uncomplicated: Secondary | ICD-10-CM | POA: Diagnosis not present

## 2015-07-31 DIAGNOSIS — R9089 Other abnormal findings on diagnostic imaging of central nervous system: Secondary | ICD-10-CM | POA: Diagnosis not present

## 2015-07-31 DIAGNOSIS — R9082 White matter disease, unspecified: Secondary | ICD-10-CM | POA: Diagnosis not present

## 2015-07-31 DIAGNOSIS — Z794 Long term (current) use of insulin: Secondary | ICD-10-CM | POA: Diagnosis not present

## 2015-07-31 DIAGNOSIS — E785 Hyperlipidemia, unspecified: Secondary | ICD-10-CM | POA: Diagnosis not present

## 2015-07-31 DIAGNOSIS — I639 Cerebral infarction, unspecified: Secondary | ICD-10-CM | POA: Diagnosis not present

## 2015-07-31 DIAGNOSIS — I6523 Occlusion and stenosis of bilateral carotid arteries: Secondary | ICD-10-CM | POA: Diagnosis not present

## 2015-08-01 DIAGNOSIS — E119 Type 2 diabetes mellitus without complications: Secondary | ICD-10-CM | POA: Diagnosis not present

## 2015-08-01 DIAGNOSIS — I6521 Occlusion and stenosis of right carotid artery: Secondary | ICD-10-CM | POA: Diagnosis not present

## 2015-08-01 DIAGNOSIS — I638 Other cerebral infarction: Secondary | ICD-10-CM | POA: Diagnosis not present

## 2015-08-01 DIAGNOSIS — I6330 Cerebral infarction due to thrombosis of unspecified cerebral artery: Secondary | ICD-10-CM | POA: Diagnosis not present

## 2015-08-10 ENCOUNTER — Encounter: Payer: Self-pay | Admitting: *Deleted

## 2015-08-11 ENCOUNTER — Encounter: Payer: Self-pay | Admitting: Diagnostic Neuroimaging

## 2015-08-11 ENCOUNTER — Ambulatory Visit (INDEPENDENT_AMBULATORY_CARE_PROVIDER_SITE_OTHER): Payer: BLUE CROSS/BLUE SHIELD | Admitting: Diagnostic Neuroimaging

## 2015-08-11 VITALS — BP 150/82 | HR 119 | Ht 63.0 in | Wt 162.6 lb

## 2015-08-11 DIAGNOSIS — I63412 Cerebral infarction due to embolism of left middle cerebral artery: Secondary | ICD-10-CM | POA: Diagnosis not present

## 2015-08-11 DIAGNOSIS — I6521 Occlusion and stenosis of right carotid artery: Secondary | ICD-10-CM | POA: Diagnosis not present

## 2015-08-11 DIAGNOSIS — Z720 Tobacco use: Secondary | ICD-10-CM

## 2015-08-11 DIAGNOSIS — E1169 Type 2 diabetes mellitus with other specified complication: Secondary | ICD-10-CM

## 2015-08-11 DIAGNOSIS — I10 Essential (primary) hypertension: Secondary | ICD-10-CM

## 2015-08-11 DIAGNOSIS — E119 Type 2 diabetes mellitus without complications: Secondary | ICD-10-CM | POA: Insufficient documentation

## 2015-08-11 NOTE — Patient Instructions (Signed)
Thank you for coming to see [place] at [facility]. I hope we have been able to provide you high quality care today.  You may receive a patient satisfaction survey over the next few weeks. We would appreciate your feedback and comments so that we may continue to improve ourselves and the health of our patients.  - request hospital records from [place]; need MRI and CTA disc/images - follow up with Dr. Gwenlyn Found (cardiology) and get echocardiogram - follow up with vascular surgery and get carotid ultrasound - continue aspirin 22m daily - continue diabetes, HTN and lipid control per PCP (Dr. MNoah Delaine - continue smoking cessation - nutrition and exercise reviewed   ~~~~~~~~~~~~~~~~~~~~~~~~~~~~~~~~~~~~~~~~~~~~~~~~~~~~~~~~~~~~~~~~~  DR. PENUMALLI'S GUIDE TO HAPPY AND HEALTHY LIVING These are some of my general health and wellness recommendations. Some of them may apply to you better than others. Please use common sense as you try these suggestions and feel free to ask me any questions.   ACTIVITY/FITNESS Mental, social, emotional and physical stimulation are very important for brain and body health. Try learning a new activity (arts, music, language, sports, games).  Keep moving your body to the best of your abilities. You can do this at home, inside or outside, the park, community center, gym or anywhere you like. Consider a physical therapist or personal trainer to get started. Consider the app Sworkit. Fitness trackers such as smart-watches, smart-phones or Fitbits can help as well.   NUTRITION Eat more plants: colorful vegetables, nuts, seeds and berries.  Eat less sugar, salt, preservatives and processed foods.  Avoid toxins such as cigarettes and alcohol.  Drink water when you are thirsty. Warm water with a slice of lemon is an excellent morning drink to start the day.  Consider these websites for more information The Nutrition Source  (hhttps://www.henry-hernandez.biz/ Precision Nutrition (wWindowBlog.ch   RELAXATION Consider practicing mindfulness meditation or other relaxation techniques such as deep breathing, prayer, yoga, tai chi, massage. See website mindful.org or the apps Headspace or Calm to help get started.   SLEEP Try to get at least 7-8+ hours sleep per day. Regular exercise and reduced caffeine will help you sleep better. Practice good sleep hygeine techniques. See website sleep.org for more information.   PLANNING Prepare estate planning, living will, healthcare POA documents. Sometimes this is best planned with the help of an attorney. Theconversationproject.org and agingwithdignity.org are excellent resources.

## 2015-08-11 NOTE — Progress Notes (Signed)
[facility]  PATIENT: [name] DOB: [birth year]  REFERRING CLINICIAN: Thea Silversmith HISTORY FROM: patient  REASON FOR VISIT: new consult    HISTORICAL  CHIEF COMPLAINT:  Chief Complaint  Patient presents with  . New Patient (Initial Visit)    CVA/TIA    HISTORY OF PRESENT ILLNESS:   69 year old female with hypertension, diabetes, hypercholesteremia here for evaluation of TIA versus stroke.  07/31/15 patient woke up 5:00 in the morning, was visiting family in [place], when she had sudden onset of right arm numbness and weakness. Symptoms lasted for almost one hour. Patient went to local emergency room and was admitted for TIA/stroke workup. MRI of the brain showed a few punctate foci of subacute infarcts in the left parietal region. CT angiogram of the head and neck demonstrated right internal carotid artery stenosis of 60-65%, felt to be asymptomatic, and patient was discharged home on aspirin.  No recurrent symptoms. Patient's last hemoglobin A1c was 7.1. Patient is currently on aspirin 81 mg daily, blood pressure medications, insulin, supplements and Wellbutrin. Patient has long history of cigarette smoking and has cut down in the last 1 year. Now she is averaging 5 cigarettes per week.  Patient is not sure if she had ultrasound of the heart. She denies chest pain, shortness of breath or palpitations.  REVIEW OF SYSTEMS: Full 14 system review of systems performed and notable only for fatigue sleepiness.  ALLERGIES: Allergies  Allergen Reactions  . Simvastatin Other (See Comments)    Muscle cramps    HOME MEDICATIONS: Outpatient Prescriptions Prior to Visit  Medication Sig Dispense Refill  . CALCIUM PO Take 1 tablet by mouth daily.      . fish oil-omega-3 fatty acids 1000 MG capsule Take 2 g by mouth daily.      Marland Kitchen lisinopril-hydrochlorothiazide (PRINZIDE,ZESTORETIC) 20-12.5 MG per tablet Take 1 tablet by mouth daily.      . vitamin E 100 UNIT  capsule Take 100 Units by mouth daily.      Marland Kitchen buPROPion (WELLBUTRIN SR) 150 MG 12 hr tablet Take 150 mg by mouth daily.      . insulin glargine (LANTUS) 100 UNIT/ML injection Inject 36 Units into the skin at bedtime.     . insulin lispro (HUMALOG) 100 UNIT/ML injection Inject 1-4 Units into the skin 3 (three) times daily before meals. Per sliding scale 150-200=1units, 200-250=3units, 300-350=4units.     No facility-administered medications prior to visit.    PAST MEDICAL HISTORY: Past Medical History  Diagnosis Date  . Diabetes mellitus     age 4's  . Hypertension   . GERD (gastroesophageal reflux disease)   . Osteopenia   . Crohn's colitis (HCC)     PAST SURGICAL HISTORY: Past Surgical History  Procedure Laterality Date  . Colon surgery      FAMILY HISTORY: No family history on file.  SOCIAL HISTORY:  Social History   Social History  . Marital Status: Married    Spouse Name: N/A  . Number of Children: N/A  . Years of Education: N/A   Occupational History  . Not on file.   Social History Main Topics  . Smoking status: Current Every Day Smoker  . Smokeless tobacco: Not on file  . Alcohol Use: No  . Drug Use: No  . Sexual Activity: Not on file   Other Topics Concern  . Not on file   Social History Narrative     PHYSICAL EXAM  GENERAL EXAM/CONSTITUTIONAL: Vitals:  Filed Vitals:  08/11/15 1443  BP: 150/82  Pulse: 119  Height:  (1.6 m)  Weight: 162 lb 9.6 oz (73.755 kg)     Body mass index is 28.81 kg/(m^2).  Visual Acuity Screening   Right eye Left eye Both eyes  Without correction:     With correction: 20/40 blurry      Patient is in no distress; well developed, nourished and groomed; neck is supple  CARDIOVASCULAR:  Examination of carotid arteries is normal; no carotid bruits  Regular rate and rhythm, no murmurs  Examination of peripheral vascular system by observation and palpation is normal  EYES:  Ophthalmoscopic exam of  optic discs and posterior segments is normal; no papilledema or hemorrhages  MUSCULOSKELETAL:  Gait, strength, tone, movements noted in Neurologic exam below  NEUROLOGIC: MENTAL STATUS:  No flowsheet data found.  awake, alert, oriented to person, place and time  recent and remote memory intact  normal attention and concentration  language fluent, comprehension intact, naming intact,   fund of knowledge appropriate  CRANIAL NERVE:   2nd - no papilledema on fundoscopic exam  2nd, 3rd, 4th, 6th - pupils equal and reactive to light, visual fields full to confrontation, extraocular muscles intact, no nystagmus  5th - facial sensation symmetric  7th - facial strength symmetric  8th - hearing intact  9th - palate elevates symmetrically, uvula midline  11th - shoulder shrug symmetric  12th - tongue protrusion midline  MOTOR:   normal bulk and tone, full strength in the BUE, BLE  SENSORY:   normal and symmetric to light touch, pinprick, temperature, vibration  COORDINATION:   finger-nose-finger, fine finger movements, foot tapping --> SLIGHTLY SLOWER ON RIGHT SIDE; LEFT ARM ORBITS AROUND RIGHT SLIGHTLY  REFLEXES:   deep tendon reflexes present and symmetric  GAIT/STATION:   narrow based gait; able to walk tandem; romberg is negative    DIAGNOSTIC DATA (LABS, IMAGING, TESTING) - I reviewed patient records, labs, notes, testing and imaging myself where available.  Lab Results  Component Value Date   WBC 9.2 08/13/2011   HGB 12.7 08/13/2011   HCT 38.3 08/13/2011   MCV 92.1 08/13/2011   PLT 272 08/13/2011      Component Value Date/Time   NA 140 08/13/2011 1731   K 4.0 08/13/2011 1731   CL 105 08/13/2011 1731   CO2 23 08/13/2011 1731   GLUCOSE 72 08/13/2011 1731   BUN 38* 08/13/2011 1731   CREATININE 1.30* 08/13/2011 1731   CALCIUM 9.9 08/13/2011 1731   PROT 7.4 08/13/2011 1731   ALBUMIN 4.1 08/13/2011 1731   AST 25 08/13/2011 1731   ALT 34  08/13/2011 1731   ALKPHOS 76 08/13/2011 1731   BILITOT 0.2* 08/13/2011 1731   GFRNONAA 42* 08/13/2011 1731   GFRAA 49* 08/13/2011 1731   No results found for: CHOL, HDL, LDLCALC, LDLDIRECT, TRIG, CHOLHDL Lab Results  Component Value Date   HGBA1C * 10/21/2009    7.9 (NOTE) The ADA recommends the following therapeutic goal for glycemic control related to Hgb A1c measurement: Goal of therapy: <6.5 Hgb A1c  Reference: American Diabetes Association: Clinical Practice Recommendations 2010, Diabetes Care, 2010, 33: (Suppl  1).   Lab Results  Component Value Date   VITAMINB12 491 09/11/2007   No results found for: TSH   07/31/15 MRI brain [report only]  - 3-4 small foci of subacute infarcts in the left parietal lobe - mild atrophy and chronic small vessel ischemic disease   07/31/15 CTA head/neck [report only] -  no intracranial stenosis - no aneurysm - 60-65% proximal right ICA stenosis - no left ICA stenosis - no vertebral artery dissection or stenosis    ASSESSMENT AND PLAN  69 y.o. year old female here with mild left brain stroke, 07/31/15, possibly related to small vessel disease versus cryptogenic embolic source. We'll request prior records and MRI/CTA imaging to review myself. Recommended patient to follow-up with echocardiogram and carotid ultrasound here in [place]. Continue stroke risk factor reduction per PCP.   Ddx:  Cerebrovascular accident (CVA) due to embolism of left middle cerebral artery (HCC)  Right-sided extracranial carotid artery stenosis  Type 2 diabetes mellitus with other specified complication (HCC)  Accelerated hypertension  Tobacco abuse    PLAN: - request hospital records from [place]; need MRI and CTA disc/images - follow up with Dr. Allyson Sabal (cardiology) and get echocardiogram - follow up with vascular surgery and get carotid u/s - continue aspirin  daily - continue diabetes, HTN and lipid control per PCP - continue smoking  cessation - nutrition and exercise reviewed  Return in about 6 weeks (around 09/22/2015).    Suanne Marker, MD 08/11/2015, 3:04 PM Certified in Neurology, Neurophysiology and Neuroimaging  [facility] [address] [place], [place] [address] [phone]

## 2015-08-12 ENCOUNTER — Encounter: Payer: Self-pay | Admitting: Diagnostic Neuroimaging

## 2015-08-17 ENCOUNTER — Telehealth: Payer: Self-pay | Admitting: Cardiovascular Disease

## 2015-08-17 NOTE — Telephone Encounter (Signed)
Received records from [facility] for apointment on 08/22/15 with Dr Allyson Sabal.  Records given to [facility] (medical records) for Dr Hazle Coca schedule on 08/22/15. lp

## 2015-08-22 ENCOUNTER — Encounter: Payer: Self-pay | Admitting: Cardiovascular Disease

## 2015-08-22 ENCOUNTER — Ambulatory Visit (INDEPENDENT_AMBULATORY_CARE_PROVIDER_SITE_OTHER): Payer: BLUE CROSS/BLUE SHIELD | Admitting: Cardiovascular Disease

## 2015-08-22 VITALS — BP 134/68 | HR 111 | Ht 64.0 in | Wt 161.9 lb

## 2015-08-22 DIAGNOSIS — I10 Essential (primary) hypertension: Secondary | ICD-10-CM | POA: Diagnosis not present

## 2015-08-22 DIAGNOSIS — I639 Cerebral infarction, unspecified: Secondary | ICD-10-CM | POA: Diagnosis not present

## 2015-08-22 DIAGNOSIS — I63412 Cerebral infarction due to embolism of left middle cerebral artery: Secondary | ICD-10-CM

## 2015-08-22 DIAGNOSIS — Z720 Tobacco use: Secondary | ICD-10-CM | POA: Diagnosis not present

## 2015-08-22 DIAGNOSIS — E785 Hyperlipidemia, unspecified: Secondary | ICD-10-CM | POA: Insufficient documentation

## 2015-08-22 DIAGNOSIS — I6521 Occlusion and stenosis of right carotid artery: Secondary | ICD-10-CM

## 2015-08-22 NOTE — Progress Notes (Signed)
08/22/2015 [name]   [birth year]  [id number]  Primary Physician Thayer Headings, MD Primary Cardiologist: Runell Gess MD [name]   HPI:  [name] is a 69 year old moderately overweight widowed Caucasian female mother of 4 living children, grandmother to 78 grandchildren referred by Dr. Charlyne Petrin for cardiovascular evaluation. 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 Her cardiovascular status profile follows notable for 50-pack-years tobacco, treated hypertension, hypokalemia, diabetes and family history the father who died of a myocardial infarction at age 39. She has never had a heart attack but has had a TIA on 07/31/59 while in [place] visiting her family. This was characterized by right upper extremity paralysis which ultimately resolved. A CT scan showed 4 small left parietal lobe subacute infarcts. Carotid Doppler showed moderate right ICA stenosis.   Current Outpatient Prescriptions  Medication Sig Dispense Refill  . amLODipine (NORVASC) 2.5 MG tablet Take 2.5 mg by mouth daily.    Marland Kitchen aspirin 81 MG tablet Take 81 mg by mouth daily.    . Biotin 5000 MCG TABS Take 1 capsule by mouth daily.    Marland Kitchen buPROPion (WELLBUTRIN XL) 300 MG 24 hr tablet Take 1 tablet by mouth daily.    Marland Kitchen CALCIUM PO Take 1 tablet by mouth daily.      . Cholecalciferol (VITAMIN D PO) Take 800 Units by mouth.    Marland Kitchen CINNAMON PO Take 500 mg by mouth daily.    . fish oil-omega-3 fatty acids 1000 MG capsule Take 2 g by mouth daily.      Marland Kitchen GLUCOSAMINE SULFATE PO Take 500 mg by mouth daily.     . insulin NPH Human (HUMULIN N,NOVOLIN N) 100 UNIT/ML injection Inject into the skin. Per sliding scale    . insulin regular (NOVOLIN R,HUMULIN R) 100 units/mL injection Inject 22 Units into the skin 2 (two) times daily before a meal.    . Insulin Regular Human (RELION R IJ) Inject as directed. Breakfast: 8 units Lunch: 10 units Dinner: 10 units    .  Lactobacillus (ACIDOPHILUS) CAPS capsule Take 1 capsule by mouth daily.    Marland Kitchen lisinopril-hydrochlorothiazide (PRINZIDE,ZESTORETIC) 20-12.5 MG per tablet Take 2 tablets by mouth daily.     Marland Kitchen lovastatin (MEVACOR) 20 MG tablet Take 20 mg by mouth at bedtime.    Marland Kitchen omeprazole (PRILOSEC) 40 MG capsule Take 1 capsule by mouth daily.    . vitamin E 100 UNIT capsule Take 100 Units by mouth daily.       No current facility-administered medications for this visit.    Allergies  Allergen Reactions  . Simvastatin Other (See Comments)    Muscle cramps    Social History   Social History  . Marital Status: Married    Spouse Name: N/A  . Number of Children: N/A  . Years of Education: N/A   Occupational History  . Not on file.   Social History Main Topics  . Smoking status: Current Every Day Smoker  . Smokeless tobacco: Never Used  . Alcohol Use: No  . Drug Use: No  . Sexual Activity: Not on file   Other Topics Concern  . Not on file   Social History Narrative     Review of Systems: General: negative for chills, fever, night sweats or weight changes.  Cardiovascular: negative for chest pain, dyspnea on exertion, edema, orthopnea, palpitations, paroxysmal nocturnal dyspnea or shortness of breath Dermatological: negative for rash Respiratory: negative for cough or  wheezing Urologic: negative for hematuria Abdominal: negative for nausea, vomiting, diarrhea, bright red blood per rectum, melena, or hematemesis Neurologic: negative for visual changes, syncope, or dizziness All other systems reviewed and are otherwise negative except as noted above.    Blood pressure 134/68, pulse 111, height  (1.626 m), weight 161 lb 14.4 oz (73.437 kg).  General appearance: alert and no distress Neck: no adenopathy, no JVD, supple, symmetrical, trachea midline, thyroid not enlarged, symmetric, no tenderness/mass/nodules and soft right carotid bruit Lungs: clear to auscultation bilaterally Heart:  regular rate and rhythm, S1, S2 normal, no murmur, click, rub or gallop Extremities: extremities normal, atraumatic, no cyanosis or edema  EKG sinus tachycardia at 111 without ST or T-wave changes. I personally reviewed this EKG  ASSESSMENT AND PLAN:   Tobacco abuse History of 50 pack years of tobacco abuse currently smoking 2-3 cigarettes a day  Right-sided extracranial carotid artery stenosis History of moderate right ICA stenosis by 2 parts ultrasound in [place] recently in the setting of a right-sided TIA. I'm going to repeat carotid Doppler study to assess significance  Accelerated hypertension History of hypertension blood pressure measured today at 134/68. Her pulse was 111. She is on amlodipine, lisinopril and hydrochlorothiazide. I'm going to obtain thyroid function tests and I will see her back in one month. Her heart rate continues to be elevated I may add a low-dose beta blocker.  Hyperlipidemia History of hyperlipidemia on lovastatin followed by her PCP      Runell Gess MD [facility], [facility] 08/22/2015 3:19 PM

## 2015-08-22 NOTE — Assessment & Plan Note (Signed)
History of moderate right ICA stenosis by 2 parts ultrasound in [place] recently in the setting of a right-sided TIA. I'm going to repeat carotid Doppler study to assess significance

## 2015-08-22 NOTE — Assessment & Plan Note (Signed)
History of 50 pack years of tobacco abuse currently smoking 2-3 cigarettes a day

## 2015-08-22 NOTE — Assessment & Plan Note (Signed)
History of hypertension blood pressure measured today at 134/68. Her pulse was 111. She is on amlodipine, lisinopril and hydrochlorothiazide. I'm going to obtain thyroid function tests and I will see her back in one month. Her heart rate continues to be elevated I may add a low-dose beta blocker.

## 2015-08-22 NOTE — Assessment & Plan Note (Signed)
History of hyperlipidemia on lovastatin followed by her PCP 

## 2015-08-22 NOTE — Patient Instructions (Addendum)
Medication Instructions:  Your physician recommends that you continue on your current medications as directed. Please refer to the Current Medication list given to you today.  Labwork: Your physician recommends that you have lab work today TSH, T4, and T3   Testing/Procedures: Your physician has requested that you have an echocardiogram. Echocardiography is a painless test that uses sound waves to create images of your heart. It provides your doctor with information about the size and shape of your heart and how well your heart's chambers and valves are working. This procedure takes approximately one hour. There are no restrictions for this procedure.  Your physician has requested that you have a carotid duplex. This test is an ultrasound of the carotid arteries in your neck. It looks at blood flow through these arteries that supply the brain with blood. Allow one hour for this exam. There are no restrictions or special instructions.   Follow-Up: We request that you follow-up in: 4 weeks in  with Dr San Morelle will receive a reminder letter in the mail two months in advance. If you don't receive a letter, please call our office to schedule the follow-up appointment.    Any Other Special Instructions Will Be Listed Below (If Applicable).     If you need a refill on your cardiac medications before your next appointment, please call your pharmacy.

## 2015-08-23 LAB — T4, FREE: Free T4: 0.98 ng/dL (ref 0.80–1.80)

## 2015-08-23 LAB — TSH: TSH: 2.305 u[IU]/mL (ref 0.350–4.500)

## 2015-08-23 LAB — T3, FREE: T3, Free: 2.3 pg/mL (ref 2.3–4.2)

## 2015-09-02 ENCOUNTER — Ambulatory Visit (HOSPITAL_COMMUNITY)
Admission: RE | Admit: 2015-09-02 | Discharge: 2015-09-02 | Disposition: A | Discharge status: Home / Self Care | Payer: BLUE CROSS/BLUE SHIELD | Source: Ambulatory Visit | Attending: Cardiovascular Disease | Admitting: Cardiovascular Disease

## 2015-09-02 ENCOUNTER — Encounter (HOSPITAL_COMMUNITY): Payer: Medicare Other

## 2015-09-02 DIAGNOSIS — I10 Essential (primary) hypertension: Secondary | ICD-10-CM | POA: Diagnosis not present

## 2015-09-02 DIAGNOSIS — E7800 Pure hypercholesterolemia, unspecified: Secondary | ICD-10-CM | POA: Insufficient documentation

## 2015-09-02 DIAGNOSIS — E119 Type 2 diabetes mellitus without complications: Secondary | ICD-10-CM | POA: Diagnosis not present

## 2015-09-02 DIAGNOSIS — I63412 Cerebral infarction due to embolism of left middle cerebral artery: Secondary | ICD-10-CM | POA: Diagnosis not present

## 2015-09-02 DIAGNOSIS — I6523 Occlusion and stenosis of bilateral carotid arteries: Secondary | ICD-10-CM | POA: Insufficient documentation

## 2015-09-06 ENCOUNTER — Encounter: Payer: BLUE CROSS/BLUE SHIELD | Admitting: Vascular Surgery

## 2015-09-06 ENCOUNTER — Encounter (HOSPITAL_COMMUNITY): Payer: BLUE CROSS/BLUE SHIELD

## 2015-09-07 ENCOUNTER — Ambulatory Visit (HOSPITAL_COMMUNITY): Payer: BLUE CROSS/BLUE SHIELD | Attending: Cardiology

## 2015-09-07 ENCOUNTER — Other Ambulatory Visit: Payer: Self-pay

## 2015-09-07 DIAGNOSIS — I63412 Cerebral infarction due to embolism of left middle cerebral artery: Secondary | ICD-10-CM | POA: Diagnosis not present

## 2015-09-07 DIAGNOSIS — E119 Type 2 diabetes mellitus without complications: Secondary | ICD-10-CM | POA: Diagnosis not present

## 2015-09-07 DIAGNOSIS — I10 Essential (primary) hypertension: Secondary | ICD-10-CM | POA: Insufficient documentation

## 2015-09-07 DIAGNOSIS — E785 Hyperlipidemia, unspecified: Secondary | ICD-10-CM | POA: Diagnosis not present

## 2015-09-07 DIAGNOSIS — I059 Rheumatic mitral valve disease, unspecified: Secondary | ICD-10-CM | POA: Diagnosis not present

## 2015-09-07 DIAGNOSIS — F17200 Nicotine dependence, unspecified, uncomplicated: Secondary | ICD-10-CM | POA: Diagnosis not present

## 2015-09-07 DIAGNOSIS — G459 Transient cerebral ischemic attack, unspecified: Secondary | ICD-10-CM | POA: Diagnosis present

## 2015-09-13 ENCOUNTER — Encounter (HOSPITAL_BASED_OUTPATIENT_CLINIC_OR_DEPARTMENT_OTHER): Payer: Self-pay | Admitting: *Deleted

## 2015-09-13 ENCOUNTER — Emergency Department (HOSPITAL_BASED_OUTPATIENT_CLINIC_OR_DEPARTMENT_OTHER): Payer: BLUE CROSS/BLUE SHIELD

## 2015-09-13 ENCOUNTER — Inpatient Hospital Stay (HOSPITAL_BASED_OUTPATIENT_CLINIC_OR_DEPARTMENT_OTHER)
Admission: EM | Admit: 2015-09-13 | Discharge: 2015-09-15 | DRG: 390 | Disposition: A | Discharge status: Home / Self Care | Payer: BLUE CROSS/BLUE SHIELD | Attending: General Surgery | Admitting: General Surgery

## 2015-09-13 DIAGNOSIS — R1084 Generalized abdominal pain: Secondary | ICD-10-CM | POA: Diagnosis not present

## 2015-09-13 DIAGNOSIS — F17210 Nicotine dependence, cigarettes, uncomplicated: Secondary | ICD-10-CM | POA: Diagnosis present

## 2015-09-13 DIAGNOSIS — K56609 Unspecified intestinal obstruction, unspecified as to partial versus complete obstruction: Secondary | ICD-10-CM | POA: Diagnosis present

## 2015-09-13 DIAGNOSIS — E119 Type 2 diabetes mellitus without complications: Secondary | ICD-10-CM | POA: Diagnosis present

## 2015-09-13 DIAGNOSIS — K5660 Unspecified intestinal obstruction: Secondary | ICD-10-CM | POA: Diagnosis present

## 2015-09-13 DIAGNOSIS — I10 Essential (primary) hypertension: Secondary | ICD-10-CM | POA: Diagnosis present

## 2015-09-13 DIAGNOSIS — E7800 Pure hypercholesterolemia, unspecified: Secondary | ICD-10-CM | POA: Diagnosis present

## 2015-09-13 DIAGNOSIS — Z794 Long term (current) use of insulin: Secondary | ICD-10-CM | POA: Diagnosis not present

## 2015-09-13 DIAGNOSIS — K219 Gastro-esophageal reflux disease without esophagitis: Secondary | ICD-10-CM | POA: Diagnosis present

## 2015-09-13 DIAGNOSIS — Z8673 Personal history of transient ischemic attack (TIA), and cerebral infarction without residual deficits: Secondary | ICD-10-CM

## 2015-09-13 DIAGNOSIS — Z7982 Long term (current) use of aspirin: Secondary | ICD-10-CM | POA: Diagnosis not present

## 2015-09-13 DIAGNOSIS — R109 Unspecified abdominal pain: Secondary | ICD-10-CM | POA: Diagnosis not present

## 2015-09-13 HISTORY — DX: Unspecified intestinal obstruction, unspecified as to partial versus complete obstruction: K56.609

## 2015-09-13 LAB — CBC
HCT: 38.6 % (ref 36.0–46.0)
Hemoglobin: 11.9 g/dL — ABNORMAL LOW (ref 12.0–15.0)
MCH: 28.6 pg (ref 26.0–34.0)
MCHC: 30.8 g/dL (ref 30.0–36.0)
MCV: 92.8 fL (ref 78.0–100.0)
Platelets: 311 10*3/uL (ref 150–400)
RBC: 4.16 MIL/uL (ref 3.87–5.11)
RDW: 14.9 % (ref 11.5–15.5)
WBC: 11.4 10*3/uL — ABNORMAL HIGH (ref 4.0–10.5)

## 2015-09-13 LAB — COMPREHENSIVE METABOLIC PANEL
ALT: 20 U/L (ref 14–54)
AST: 21 U/L (ref 15–41)
Albumin: 4 g/dL (ref 3.5–5.0)
Alkaline Phosphatase: 77 U/L (ref 38–126)
Anion gap: 8 (ref 5–15)
BUN: 33 mg/dL — ABNORMAL HIGH (ref 6–20)
CO2: 26 mmol/L (ref 22–32)
Calcium: 9.3 mg/dL (ref 8.9–10.3)
Chloride: 104 mmol/L (ref 101–111)
Creatinine, Ser: 1.07 mg/dL — ABNORMAL HIGH (ref 0.44–1.00)
GFR calc Af Amer: 60 mL/min — ABNORMAL LOW (ref >60)
GFR calc non Af Amer: 52 mL/min — ABNORMAL LOW (ref >60)
Glucose, Bld: 233 mg/dL — ABNORMAL HIGH (ref 65–99)
Potassium: 4.6 mmol/L (ref 3.5–5.1)
Sodium: 138 mmol/L (ref 135–145)
Total Bilirubin: 0.4 mg/dL (ref 0.3–1.2)
Total Protein: 7.6 g/dL (ref 6.5–8.1)

## 2015-09-13 LAB — LIPASE, BLOOD: Lipase: 20 U/L (ref 11–51)

## 2015-09-13 MED ORDER — SODIUM CHLORIDE 0.9 % IV BOLUS (SEPSIS)
1000.0000 mL | Freq: Once | INTRAVENOUS | Status: AC
  Administered 2015-09-13: 1000 mL via INTRAVENOUS

## 2015-09-13 MED ORDER — LIDOCAINE HCL 2 % EX GEL
1.0000 "application " | Freq: Once | CUTANEOUS | Status: DC
  Filled 2015-09-13: qty 20

## 2015-09-13 MED ORDER — ONDANSETRON HCL 4 MG/2ML IJ SOLN
4.0000 mg | Freq: Once | INTRAMUSCULAR | Status: AC
  Administered 2015-09-13: 4 mg via INTRAVENOUS
  Filled 2015-09-13: qty 2

## 2015-09-13 MED ORDER — IOHEXOL 300 MG/ML  SOLN
50.0000 mL | Freq: Once | INTRAMUSCULAR | Status: AC | PRN
Start: 2015-09-13 — End: 2015-09-13
  Administered 2015-09-13: 50 mL via ORAL

## 2015-09-13 MED ORDER — LIDOCAINE VISCOUS 2 % MT SOLN: 15.0000 mL | Freq: Once | OROMUCOSAL | Status: DC

## 2015-09-13 MED ORDER — ALBUTEROL SULFATE HFA 108 (90 BASE) MCG/ACT IN AERS: 2.0000 | INHALATION_SPRAY | Freq: Once | RESPIRATORY_TRACT | Status: DC

## 2015-09-13 MED ORDER — HYDROMORPHONE HCL 1 MG/ML IJ SOLN
1.0000 mg | Freq: Once | INTRAMUSCULAR | Status: AC
Start: 2015-09-13 — End: 2015-09-13
  Administered 2015-09-13: 1 mg via INTRAVENOUS
  Filled 2015-09-13: qty 1

## 2015-09-13 NOTE — ED Notes (Signed)
Patient ambulatory to the restroom x 2 - unable to void for specimen

## 2015-09-13 NOTE — ED Notes (Signed)
Pt c/o diffuse abd pain x 6 hrs denies n/v/d

## 2015-09-13 NOTE — ED Provider Notes (Signed)
CSN: [id number]     Arrival date & time 09/13/15  1713 History   First MD Initiated Contact with Patient 09/13/15 1726     Chief Complaint  Patient presents with  . Abdominal Cramping     Patient is a 70 y.o. female presenting with cramps.  Abdominal Cramping    [name] is a 70 y.o. female who presents to the Emergency Department complaining of abdominal pain. About 6 hours prior to ED arrival she developed diffuse abdominal pain described as a cramping and bloating sensation. She has nausea, loose stool x 1. Symptoms are severe and constant in nature. She denies any fevers. 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 She has a history of diabetes, Crohn's disease, ileostomy and ileostomy reversal.  Past Medical History  Diagnosis Date  . Diabetes mellitus     age 23's  . Hypertension   . GERD (gastroesophageal reflux disease)   . Osteopenia   . Crohn's colitis (HCC)   . Hypercholesterolemia   . Anemia   . Osteopenia   . TIA (transient ischemic attack)    Past Surgical History  Procedure Laterality Date  . Colon surgery  07/08/2007  . Ileostomy  02/26/2008    reversal  . Hernia repair  11/21/2009   Family History  Problem Relation Age of Onset  . Stroke Mother   . Heart attack Father   . Diabetes Brother   . Hypertension Daughter    Social History  Substance Use Topics  . Smoking status: Current Every Day Smoker    Types: Cigarettes  . Smokeless tobacco: Never Used  . Alcohol Use: No   OB History    No data available     Review of Systems    Allergies  Simvastatin  Home Medications   Prior to Admission medications   Medication Sig Start Date End Date Taking? Authorizing Provider  amLODipine (NORVASC) 2.5 MG tablet Take 2.5 mg by mouth daily.    Historical Provider, MD  aspirin 81 MG tablet Take 81 mg by mouth daily.    Historical Provider, MD  Biotin 5000 MCG TABS Take 1 capsule by mouth daily.    Historical Provider, MD  buPROPion  (WELLBUTRIN XL) 300 MG 24 hr tablet Take 1 tablet by mouth daily.    Historical Provider, MD  CALCIUM PO Take 1 tablet by mouth daily.      Historical Provider, MD  Cholecalciferol (VITAMIN D PO) Take 800 Units by mouth.    Historical Provider, MD  CINNAMON PO Take 500 mg by mouth daily.    Historical Provider, MD  fish oil-omega-3 fatty acids 1000 MG capsule Take 2 g by mouth daily.      Historical Provider, MD  GLUCOSAMINE SULFATE PO Take 500 mg by mouth daily.     Historical Provider, MD  insulin NPH Human (HUMULIN N,NOVOLIN N) 100 UNIT/ML injection Inject into the skin. Per sliding scale    Historical Provider, MD  insulin regular (NOVOLIN R,HUMULIN R) 100 units/mL injection Inject 22 Units into the skin 2 (two) times daily before a meal.    Historical Provider, MD  Insulin Regular Human (RELION R IJ) Inject as directed. Breakfast: 8 units Lunch: 10 units Dinner: 10 units    Historical Provider, MD  Lactobacillus (ACIDOPHILUS) CAPS capsule Take 1 capsule by mouth daily.    Historical Provider, MD  lisinopril-hydrochlorothiazide (PRINZIDE,ZESTORETIC) 20-12.5 MG per tablet Take 2 tablets by mouth daily.     Historical Provider, MD  lovastatin (MEVACOR) 20 MG tablet Take 20 mg by mouth at bedtime.    Historical Provider, MD  omeprazole (PRILOSEC) 40 MG capsule Take 1 capsule by mouth daily. 05/05/15   Historical Provider, MD  vitamin E 100 UNIT capsule Take 100 Units by mouth daily.      Historical Provider, MD   BP 152/66 mmHg  Pulse 89  Temp(Src) 97.7 F (36.5 C) (Oral)  Resp 16  Ht 5\' 4"  (1.626 m)  Wt 160 lb (72.576 kg)  BMI 27.45 kg/m2  SpO2 92% Physical Exam  Constitutional: She is oriented to person, place, and time. She appears well-developed and well-nourished.  HENT:  Head: Normocephalic and atraumatic.  Cardiovascular: Normal rate and regular rhythm.   No murmur heard. Pulmonary/Chest: Effort normal and breath sounds normal. No respiratory distress.  Abdominal: She exhibits  distension.  Hypoactive bowel sounds. Mild diffuse tenderness to palpation.  Musculoskeletal: She exhibits no edema or tenderness.  Neurological: She is alert and oriented to person, place, and time.  Skin: Skin is warm and dry.  Psychiatric: She has a normal mood and affect. Her behavior is normal.  Nursing note and vitals reviewed.   ED Course  Procedures (including critical care time) Labs Review Labs Reviewed  COMPREHENSIVE METABOLIC PANEL - Abnormal; Notable for the following:    Glucose, Bld 233 (*)    BUN 33 (*)    Creatinine, Ser 1.07 (*)    GFR calc non Af Amer 52 (*)    GFR calc Af Amer 60 (*)    All other components within normal limits  CBC - Abnormal; Notable for the following:    WBC 11.4 (*)    Hemoglobin 11.9 (*)    All other components within normal limits  LIPASE, BLOOD  URINALYSIS, ROUTINE W REFLEX MICROSCOPIC (NOT AT [facility])    Imaging Review Ct Abdomen Pelvis Wo Contrast  09/13/2015  CLINICAL DATA:  Generalized abdominal pain after lunch. Diarrhea. History of Crohn's disease. EXAM: CT ABDOMEN AND PELVIS WITHOUT CONTRAST TECHNIQUE: Multidetector CT imaging of the abdomen and pelvis was performed following the standard protocol without IV contrast. COMPARISON:  None. FINDINGS: Lower chest: Lung bases are clear. Normal heart size. Mitral annular calcification. Hepatobiliary: Normal liver and gallbladder. Pancreas: Normal. Spleen: Normal. Adrenals/Urinary Tract: Normal adrenal glands. No urolithiasis or obstructive uropathy. Normal kidneys. Normal bladder. Stomach/Bowel: Small bowel dilatation measuring up to 4 cm with decompressed distal small bowel. There is a relative transition point in the right mid abdomen (image 36/series 2). Prior small bowel anastomosis. No pneumatosis, pneumoperitoneum or portal venous gas. No significant pelvic or abdominal free fluid. Vascular/Lymphatic: Normal caliber abdominal aorta. No lymphadenopathy. Reproductive: Normal uterus.  No  adnexal mass. Other: No focal fluid collection or hematoma. Musculoskeletal: No lytic or sclerotic osseous lesion. There is degenerative disc disease with a broad-based disc bulge at L5-S1. There is a chronic anterior compression fracture of the T12 vertebral body. IMPRESSION: 1. Small bowel obstruction with a relative transition point along the anterior right mid abdomen (image 36/series 2). Electronically Signed   By: Elige Ko   On: 09/13/2015 20:19   I have personally reviewed and evaluated these images and lab results as part of my medical decision-making.   EKG Interpretation None      MDM   Final diagnoses:  Small bowel obstruction [facility])    Patient here for evaluation of abdominal pain and bloating, history of Crohn's disease CT scan is concerning with a bowel obstruction with a transition  point. NG tube was placed with significant return of gastric contents and patient feeling improved. Discussed with general surgery, Dr. Sheliah Hatch, agrees to see the patient.. Plan to transfer the patient to [facility] for further treatment  Tilden Fossa, MD 09/14/15 [phone]

## 2015-09-13 NOTE — ED Notes (Signed)
Daughters phone number: Merry Proud - [phone]

## 2015-09-14 DIAGNOSIS — K56609 Unspecified intestinal obstruction, unspecified as to partial versus complete obstruction: Secondary | ICD-10-CM | POA: Diagnosis present

## 2015-09-14 LAB — BASIC METABOLIC PANEL
Anion gap: 8 (ref 5–15)
BUN: 24 mg/dL — ABNORMAL HIGH (ref 6–20)
CO2: 27 mmol/L (ref 22–32)
Calcium: 8.9 mg/dL (ref 8.9–10.3)
Chloride: 104 mmol/L (ref 101–111)
Creatinine, Ser: 0.9 mg/dL (ref 0.44–1.00)
GFR calc Af Amer: >60 mL/min (ref >60)
GFR calc non Af Amer: >60 mL/min (ref >60)
Glucose, Bld: 277 mg/dL — ABNORMAL HIGH (ref 65–99)
Potassium: 4.8 mmol/L (ref 3.5–5.1)
Sodium: 139 mmol/L (ref 135–145)

## 2015-09-14 LAB — CBC
HCT: 35 % — ABNORMAL LOW (ref 36.0–46.0)
Hemoglobin: 11 g/dL — ABNORMAL LOW (ref 12.0–15.0)
MCH: 28.8 pg (ref 26.0–34.0)
MCHC: 31.4 g/dL (ref 30.0–36.0)
MCV: 91.6 fL (ref 78.0–100.0)
Platelets: 263 10*3/uL (ref 150–400)
RBC: 3.82 MIL/uL — ABNORMAL LOW (ref 3.87–5.11)
RDW: 14.7 % (ref 11.5–15.5)
WBC: 9.7 10*3/uL (ref 4.0–10.5)

## 2015-09-14 LAB — URINALYSIS, ROUTINE W REFLEX MICROSCOPIC
Bilirubin Urine: NEGATIVE
Glucose, UA: 100 mg/dL — AB
Hgb urine dipstick: NEGATIVE
Ketones, ur: NEGATIVE mg/dL
Leukocytes, UA: NEGATIVE
Nitrite: NEGATIVE
Protein, ur: NEGATIVE mg/dL
Specific Gravity, Urine: 1.012 (ref 1.005–1.030)
pH: 5.5 (ref 5.0–8.0)

## 2015-09-14 LAB — GLUCOSE, CAPILLARY
Glucose-Capillary: 247 mg/dL — ABNORMAL HIGH (ref 65–99)
Glucose-Capillary: 249 mg/dL — ABNORMAL HIGH (ref 65–99)
Glucose-Capillary: 219 mg/dL — ABNORMAL HIGH (ref 65–99)
Glucose-Capillary: 199 mg/dL — ABNORMAL HIGH (ref 65–99)
Glucose-Capillary: 146 mg/dL — ABNORMAL HIGH (ref 65–99)
Glucose-Capillary: 212 mg/dL — ABNORMAL HIGH (ref 65–99)

## 2015-09-14 MED ORDER — HYDROCHLOROTHIAZIDE 12.5 MG PO CAPS
12.5000 mg | ORAL_CAPSULE | Freq: Every day | ORAL | Status: DC
  Administered 2015-09-14 – 2015-09-15 (×2): 12.5 mg via ORAL
  Filled 2015-09-14 (×2): qty 1

## 2015-09-14 MED ORDER — HYDRALAZINE HCL 20 MG/ML IJ SOLN
10.0000 mg | INTRAMUSCULAR | Status: DC | PRN
  Administered 2015-09-14: 10 mg via INTRAVENOUS
  Filled 2015-09-14: qty 1

## 2015-09-14 MED ORDER — LISINOPRIL 20 MG PO TABS
20.0000 mg | ORAL_TABLET | Freq: Every day | ORAL | Status: DC
  Administered 2015-09-14 – 2015-09-15 (×2): 20 mg via ORAL
  Filled 2015-09-14 (×2): qty 1

## 2015-09-14 MED ORDER — INSULIN ASPART 100 UNIT/ML ~~LOC~~ SOLN
0.0000 [IU] | SUBCUTANEOUS | Status: DC
  Administered 2015-09-14: 8 [IU] via SUBCUTANEOUS
  Administered 2015-09-14: 4 [IU] via SUBCUTANEOUS
  Administered 2015-09-14: 2 [IU] via SUBCUTANEOUS
  Administered 2015-09-14 (×2): 8 [IU] via SUBCUTANEOUS
  Administered 2015-09-15: 4 [IU] via SUBCUTANEOUS
  Administered 2015-09-15: 2 [IU] via SUBCUTANEOUS
  Administered 2015-09-15 (×2): 4 [IU] via SUBCUTANEOUS

## 2015-09-14 MED ORDER — AMLODIPINE BESYLATE 2.5 MG PO TABS
2.5000 mg | ORAL_TABLET | Freq: Every day | ORAL | Status: DC
  Administered 2015-09-14 – 2015-09-15 (×2): 2.5 mg via ORAL
  Filled 2015-09-14 (×2): qty 1

## 2015-09-14 MED ORDER — BUPROPION HCL ER (XL) 300 MG PO TB24
300.0000 mg | ORAL_TABLET | Freq: Every day | ORAL | Status: DC
  Administered 2015-09-14 – 2015-09-15 (×2): 300 mg via ORAL
  Filled 2015-09-14 (×2): qty 1

## 2015-09-14 MED ORDER — ENOXAPARIN SODIUM 40 MG/0.4ML ~~LOC~~ SOLN
40.0000 mg | SUBCUTANEOUS | Status: DC
  Administered 2015-09-14 – 2015-09-15 (×2): 40 mg via SUBCUTANEOUS
  Filled 2015-09-14 (×2): qty 0.4

## 2015-09-14 MED ORDER — MORPHINE SULFATE (PF) 2 MG/ML IV SOLN: 2.0000 mg | INTRAVENOUS | Status: DC | PRN

## 2015-09-14 MED ORDER — PANTOPRAZOLE SODIUM 40 MG IV SOLR
40.0000 mg | Freq: Every day | INTRAVENOUS | Status: DC
  Administered 2015-09-14: 40 mg via INTRAVENOUS
  Filled 2015-09-14 (×2): qty 40

## 2015-09-14 MED ORDER — SODIUM CHLORIDE 0.9 % IV SOLN
INTRAVENOUS | Status: DC
  Administered 2015-09-14 (×2): 75 mL/h via INTRAVENOUS
  Administered 2015-09-15: 08:00:00 via INTRAVENOUS

## 2015-09-14 MED ORDER — ONDANSETRON 4 MG PO TBDP: 4.0000 mg | ORAL_TABLET | Freq: Four times a day (QID) | ORAL | Status: DC | PRN

## 2015-09-14 MED ORDER — ONDANSETRON HCL 4 MG/2ML IJ SOLN
4.0000 mg | Freq: Four times a day (QID) | INTRAMUSCULAR | Status: DC | PRN
  Administered 2015-09-14: 4 mg via INTRAVENOUS
  Filled 2015-09-14: qty 2

## 2015-09-14 MED ORDER — LISINOPRIL-HYDROCHLOROTHIAZIDE 20-12.5 MG PO TABS: 2.0000 | ORAL_TABLET | Freq: Every day | ORAL | Status: DC

## 2015-09-14 NOTE — H&P (Signed)
[name] is an 70 y.o. female.   Chief Complaint: vomiting and abdominal pain HPI: 70 yo female with history of crohn's who has not been on medications in over 1 year presents with 1 day of abdominal pain, nausea and vomiting. She also has a history of bowel resection with ileostomy and then ileostomy reversal and well as hernia repair with mesh. She denies fevers or chills. She has had a bowel movement since the ER visit yesterday and is passing flatus.  Past Medical History  Diagnosis Date  . Diabetes mellitus     age 59's  . Hypertension   . GERD (gastroesophageal reflux disease)   . Osteopenia   . Crohn's colitis ([place])   . Hypercholesterolemia   . Anemia   . Osteopenia   . TIA (transient ischemic attack)     Past Surgical History  Procedure Laterality Date  . Colon surgery  07/08/2007  . Ileostomy  02/26/2008    reversal  . Hernia repair  11/21/2009    Family History  Problem Relation Age of Onset  . Stroke Mother   . Heart attack Father   . Diabetes Brother   . Hypertension Daughter    Social History:  reports that she has been smoking Cigarettes.  She has never used smokeless tobacco. She reports that she does not drink alcohol or use illicit drugs.  Allergies:  Allergies  Allergen Reactions  . Simvastatin Other (See Comments)    Muscle cramps    Medications Prior to Admission  Medication Sig Dispense Refill  . amLODipine (NORVASC) 2.5 MG tablet Take 2.5 mg by mouth daily.    Marland Kitchen aspirin 81 MG tablet Take 81 mg by mouth daily.    . Biotin 5000 MCG TABS Take 5,000 mcg by mouth daily.     Marland Kitchen buPROPion (WELLBUTRIN XL) 300 MG 24 hr tablet Take 300 mg by mouth daily.     Marland Kitchen CALCIUM PO Take 1 tablet by mouth daily.      . Cholecalciferol (VITAMIN D PO) Take 800 Units by mouth daily.     Marland Kitchen CINNAMON PO Take 500 mg by mouth daily.    . fish oil-omega-3 fatty acids 1000 MG capsule Take 2 g by mouth daily.      Marland Kitchen GLUCOSAMINE SULFATE PO Take 500 mg by mouth daily.      . insulin NPH Human (HUMULIN N,NOVOLIN N) 100 UNIT/ML injection Inject 0-10 Units into the skin 3 (three) times daily before meals. Per sliding scale    . insulin regular (NOVOLIN R,HUMULIN R) 100 units/mL injection Inject 22 Units into the skin 2 (two) times daily before a meal.    . Insulin Regular Human (RELION R IJ) Inject 8-10 Units as directed 3 (three) times daily with meals. Breakfast: 8 units Lunch: 10 units Dinner: 10 units    . Lactobacillus (ACIDOPHILUS) CAPS capsule Take 1 capsule by mouth daily.    Marland Kitchen lisinopril-hydrochlorothiazide (PRINZIDE,ZESTORETIC) 20-12.5 MG per tablet Take 2 tablets by mouth daily.     Marland Kitchen lovastatin (MEVACOR) 20 MG tablet Take 20 mg by mouth at bedtime.    Marland Kitchen omeprazole (PRILOSEC) 40 MG capsule Take 40 mg by mouth daily.     . vitamin E 100 UNIT capsule Take 100 Units by mouth daily.        Results for orders placed or performed during the hospital encounter of 09/13/15 (from the past 48 hour(s))  Lipase, blood     Status: None   Collection Time:  09/13/15  5:50 PM  Result Value Ref Range   Lipase 20 11 - 51 U/L  Comprehensive metabolic panel     Status: Abnormal   Collection Time: 09/13/15  5:50 PM  Result Value Ref Range   Sodium 138 135 - 145 mmol/L   Potassium 4.6 3.5 - 5.1 mmol/L   Chloride 104 101 - 111 mmol/L   CO2 26 22 - 32 mmol/L   Glucose, Bld 233 (H) 65 - 99 mg/dL   BUN 33 (H) 6 - 20 mg/dL   Creatinine, Ser 1.07 (H) 0.44 - 1.00 mg/dL   Calcium 9.3 8.9 - 10.3 mg/dL   Total Protein 7.6 6.5 - 8.1 g/dL   Albumin 4.0 3.5 - 5.0 g/dL   AST 21 15 - 41 U/L   ALT 20 14 - 54 U/L   Alkaline Phosphatase 77 38 - 126 U/L   Total Bilirubin 0.4 0.3 - 1.2 mg/dL   GFR calc non Af Amer 52 (L) >60 mL/min   GFR calc Af Amer 60 (L) >60 mL/min    Comment: (NOTE) The eGFR has been calculated using the CKD EPI equation. This calculation has not been validated in all clinical situations. eGFR's persistently <60 mL/min signify possible Chronic  Kidney Disease.    Anion gap 8 5 - 15  CBC     Status: Abnormal   Collection Time: 09/13/15  5:50 PM  Result Value Ref Range   WBC 11.4 (H) 4.0 - 10.5 K/uL   RBC 4.16 3.87 - 5.11 MIL/uL   Hemoglobin 11.9 (L) 12.0 - 15.0 g/dL   HCT 38.6 36.0 - 46.0 %   MCV 92.8 78.0 - 100.0 fL   MCH 28.6 26.0 - 34.0 pg   MCHC 30.8 30.0 - 36.0 g/dL   RDW 14.9 11.5 - 15.5 %   Platelets 311 150 - 400 K/uL  Glucose, capillary     Status: Abnormal   Collection Time: 09/14/15  1:18 AM  Result Value Ref Range   Glucose-Capillary 247 (H) 65 - 99 mg/dL  Urinalysis, Routine w reflex microscopic (not at [facility])     Status: Abnormal   Collection Time: 09/14/15  2:54 AM  Result Value Ref Range   Color, Urine YELLOW YELLOW   APPearance CLEAR CLEAR   Specific Gravity, Urine 1.012 1.005 - 1.030   pH 5.5 5.0 - 8.0   Glucose, UA 100 (A) NEGATIVE mg/dL   Hgb urine dipstick NEGATIVE NEGATIVE   Bilirubin Urine NEGATIVE NEGATIVE   Ketones, ur NEGATIVE NEGATIVE mg/dL   Protein, ur NEGATIVE NEGATIVE mg/dL   Nitrite NEGATIVE NEGATIVE   Leukocytes, UA NEGATIVE NEGATIVE    Comment: MICROSCOPIC NOT DONE ON URINES WITH NEGATIVE PROTEIN, BLOOD, LEUKOCYTES, NITRITE, OR GLUCOSE <1000 mg/dL.  CBC     Status: Abnormal   Collection Time: 09/14/15  4:10 AM  Result Value Ref Range   WBC 9.7 4.0 - 10.5 K/uL   RBC 3.82 (L) 3.87 - 5.11 MIL/uL   Hemoglobin 11.0 (L) 12.0 - 15.0 g/dL   HCT 35.0 (L) 36.0 - 46.0 %   MCV 91.6 78.0 - 100.0 fL   MCH 28.8 26.0 - 34.0 pg   MCHC 31.4 30.0 - 36.0 g/dL   RDW 14.7 11.5 - 15.5 %   Platelets 263 150 - 400 K/uL  Basic metabolic panel     Status: Abnormal   Collection Time: 09/14/15  4:10 AM  Result Value Ref Range   Sodium 139 135 - 145 mmol/L  Potassium 4.8 3.5 - 5.1 mmol/L   Chloride 104 101 - 111 mmol/L   CO2 27 22 - 32 mmol/L   Glucose, Bld 277 (H) 65 - 99 mg/dL   BUN 24 (H) 6 - 20 mg/dL   Creatinine, Ser 0.90 0.44 - 1.00 mg/dL   Calcium 8.9 8.9 - 10.3 mg/dL   GFR calc non Af  Amer >60 >60 mL/min   GFR calc Af Amer >60 >60 mL/min    Comment: (NOTE) The eGFR has been calculated using the CKD EPI equation. This calculation has not been validated in all clinical situations. eGFR's persistently <60 mL/min signify possible Chronic Kidney Disease.    Anion gap 8 5 - 15  Glucose, capillary     Status: Abnormal   Collection Time: 09/14/15  5:48 AM  Result Value Ref Range   Glucose-Capillary 249 (H) 65 - 99 mg/dL   Ct Abdomen Pelvis Wo Contrast  09/13/2015  CLINICAL DATA:  Generalized abdominal pain after lunch. Diarrhea. History of Crohn's disease. EXAM: CT ABDOMEN AND PELVIS WITHOUT CONTRAST TECHNIQUE: Multidetector CT imaging of the abdomen and pelvis was performed following the standard protocol without IV contrast. COMPARISON:  None. FINDINGS: Lower chest: Lung bases are clear. Normal heart size. Mitral annular calcification. Hepatobiliary: Normal liver and gallbladder. Pancreas: Normal. Spleen: Normal. Adrenals/Urinary Tract: Normal adrenal glands. No urolithiasis or obstructive uropathy. Normal kidneys. Normal bladder. Stomach/Bowel: Small bowel dilatation measuring up to 4 cm with decompressed distal small bowel. There is a relative transition point in the right mid abdomen (image 36/series 2). Prior small bowel anastomosis. No pneumatosis, pneumoperitoneum or portal venous gas. No significant pelvic or abdominal free fluid. Vascular/Lymphatic: Normal caliber abdominal aorta. No lymphadenopathy. Reproductive: Normal uterus.  No adnexal mass. Other: No focal fluid collection or hematoma. Musculoskeletal: No lytic or sclerotic osseous lesion. There is degenerative disc disease with a broad-based disc bulge at L5-S1. There is a chronic anterior compression fracture of the T12 vertebral body. IMPRESSION: 1. Small bowel obstruction with a relative transition point along the anterior right mid abdomen (image 36/series 2). Electronically Signed   By: Kathreen Devoid   On: 09/13/2015  20:19    Review of Systems  Constitutional: Negative for fever and chills.  HENT: Negative for hearing loss.   Eyes: Negative for blurred vision and double vision.  Respiratory: Negative for cough and hemoptysis.   Cardiovascular: Negative for chest pain and palpitations.  Gastrointestinal: Positive for nausea, vomiting and abdominal pain.  Genitourinary: Negative for dysuria and urgency.  Musculoskeletal: Negative for myalgias and neck pain.  Skin: Negative for itching and rash.  Neurological: Negative for dizziness, tingling and headaches.  Endo/Heme/Allergies: Does not bruise/bleed easily.  Psychiatric/Behavioral: Negative for depression and suicidal ideas.    Blood pressure 183/76, pulse 93, temperature 99.1 F (37.3 C), temperature source Oral, resp. rate 14, height _0  (1.626 m), weight 72.576 kg (160 lb), SpO2 98 %. Physical Exam  Vitals reviewed. Constitutional: She is oriented to person, place, and time. She appears well-developed and well-nourished.  HENT:  Head: Normocephalic and atraumatic.  Eyes: Conjunctivae and EOM are normal. Pupils are equal, round, and reactive to light.  Neck: Normal range of motion. Neck supple.  Cardiovascular: Normal rate and regular rhythm.   Respiratory: Effort normal and breath sounds normal.  GI: Soft. Bowel sounds are normal. She exhibits no distension. There is no tenderness.  Musculoskeletal: Normal range of motion.  Neurological: She is alert and oriented to person, place, and time.  Skin:  Skin is warm and dry.  Psychiatric: She has a normal mood and affect. Her behavior is normal.     Assessment/Plan Small bowel obstruction: Based on clinical symptoms this appears to have started to resolve. Also due to the time frame of one day with no previous malaise this is most likely adhesive in nature and I would not start steroids at this time. -continue NG tube -IV fluids -up to chair  Diabetes mellitus- on NPH 22units BID and  sliding scale at home. Will use sliding scale and see what is needed as we advance her diet   Arta Bruce Kinsinger 09/14/2015, 6:48 AM

## 2015-09-15 ENCOUNTER — Inpatient Hospital Stay (HOSPITAL_COMMUNITY): Payer: BLUE CROSS/BLUE SHIELD

## 2015-09-15 LAB — GLUCOSE, CAPILLARY
Glucose-Capillary: 168 mg/dL — ABNORMAL HIGH (ref 65–99)
Glucose-Capillary: 175 mg/dL — ABNORMAL HIGH (ref 65–99)
Glucose-Capillary: 185 mg/dL — ABNORMAL HIGH (ref 65–99)
Glucose-Capillary: 187 mg/dL — ABNORMAL HIGH (ref 65–99)

## 2015-09-15 MED ORDER — PHENOL 1.4 % MT LIQD
1.0000 | OROMUCOSAL | Status: DC | PRN
  Filled 2015-09-15: qty 177

## 2015-09-15 NOTE — Discharge Summary (Signed)
Physician Discharge Summary  [name] ZOX:[id number] DOB: 02/08/1946 DOA: 09/13/2015  PCP: Thayer Headings, MD  Consultation: none  Admit date: 09/13/2015 Discharge date: 09/15/2015  Recommendations for Outpatient Follow-up:   Follow-up Information    Follow up with Adolph Pollack, MD.   Specialty:  General Surgery   Why:  no follow up needed.  return to ED should symptoms develop    Contact information:   [address] [place] [address] [phone]      Discharge Diagnoses:  1. SBO   Surgical Procedure: none  Discharge Condition: stable Disposition: home  Diet recommendation: low residue x1 week, then high fiber slowly   [employer]   09/13/15 1723  Weight: 72.576 kg (160 lb)       Hospital Course:  [name] is a 70 year old female with a previous history of ileocecectomy/ileostomy with subsequent reversal and a hernia repair with mesh presented to [facility] with abdominal pain and vomiting.  After arriving to the ED, she began to have bowel movements and pass flatus.  She was admitted for bowel rest, did not require NGT decompression.  A follow AXR showed resolution of a bowel obstruction.  Diet was advanced as the patient wished to go home fairly quickly.  On HD#2 she was felt stable for discharge with full liquids, then low residue diet which we discussed x1 week and thereafter introducing high fiber.  Warning signs that warrant further evaluation were discussed.  She knows to return to the ED should she had return of symptoms.    Discharge Instructions     Medication List    TAKE these medications        Acidophilus Caps capsule  Take 1 capsule by mouth daily.     amLODipine 2.5 MG tablet  Commonly known as:  NORVASC  Take 2.5 mg by mouth daily.     aspirin 81 MG tablet  Take 81 mg by mouth daily.     Biotin 5000 MCG Tabs  Take 5,000 mcg by mouth daily.     buPROPion 300 MG 24 hr tablet  Commonly known as:  WELLBUTRIN XL   Take 300 mg by mouth daily.     CALCIUM PO  Take 1 tablet by mouth daily.     CINNAMON PO  Take 500 mg by mouth daily.     fish oil-omega-3 fatty acids 1000 MG capsule  Take 2 g by mouth daily.     GLUCOSAMINE SULFATE PO  Take 500 mg by mouth daily.     insulin NPH Human 100 UNIT/ML injection  Commonly known as:  HUMULIN N,NOVOLIN N  Inject 0-10 Units into the skin 3 (three) times daily before meals. Per sliding scale     lisinopril-hydrochlorothiazide 20-12.5 MG tablet  Commonly known as:  PRINZIDE,ZESTORETIC  Take 2 tablets by mouth daily.     lovastatin 20 MG tablet  Commonly known as:  MEVACOR  Take 20 mg by mouth at bedtime.     omeprazole 40 MG capsule  Commonly known as:  PRILOSEC  Take 40 mg by mouth daily.     RELION R IJ  Inject 8-10 Units as directed 3 (three) times daily with meals. Breakfast: 8 units Lunch: 10 units Dinner: 10 units     insulin regular 100 units/mL injection  Commonly known as:  NOVOLIN R,HUMULIN R  Inject 22 Units into the skin 2 (two) times daily before a meal.     VITAMIN D PO  Take 800 Units by mouth daily.     vitamin E 100 UNIT capsule  Take 100 Units by mouth daily.           Follow-up Information    Follow up with Adolph Pollack, MD.   Specialty:  General Surgery   Why:  no follow up needed.  return to ED should symptoms develop    Contact information:   [address] [place] [address] [phone]        The results of significant diagnostics from this hospitalization (including imaging, microbiology, ancillary and laboratory) are listed below for reference.    Significant Diagnostic Studies: Ct Abdomen Pelvis Wo Contrast  09/13/2015  CLINICAL DATA:  Generalized abdominal pain after lunch. Diarrhea. History of Crohn's disease. EXAM: CT ABDOMEN AND PELVIS WITHOUT CONTRAST TECHNIQUE: Multidetector CT imaging of the abdomen and pelvis was performed following the standard protocol without IV  contrast. COMPARISON:  None. FINDINGS: Lower chest: Lung bases are clear. Normal heart size. Mitral annular calcification. Hepatobiliary: Normal liver and gallbladder. Pancreas: Normal. Spleen: Normal. Adrenals/Urinary Tract: Normal adrenal glands. No urolithiasis or obstructive uropathy. Normal kidneys. Normal bladder. Stomach/Bowel: Small bowel dilatation measuring up to 4 cm with decompressed distal small bowel. There is a relative transition point in the right mid abdomen (image 36/series 2). Prior small bowel anastomosis. No pneumatosis, pneumoperitoneum or portal venous gas. No significant pelvic or abdominal free fluid. Vascular/Lymphatic: Normal caliber abdominal aorta. No lymphadenopathy. Reproductive: Normal uterus.  No adnexal mass. Other: No focal fluid collection or hematoma. Musculoskeletal: No lytic or sclerotic osseous lesion. There is degenerative disc disease with a broad-based disc bulge at L5-S1. There is a chronic anterior compression fracture of the T12 vertebral body. IMPRESSION: 1. Small bowel obstruction with a relative transition point along the anterior right mid abdomen (image 36/series 2). Electronically Signed   By: Elige Ko   On: 09/13/2015 20:19   Dg Abd Portable 1v  09/15/2015  CLINICAL DATA:  Abdominal pain.  Recent bowel obstruction EXAM: PORTABLE ABDOMEN - 1 VIEW COMPARISON:  CT abdomen and pelvis June 12, 2016 FINDINGS: There is residual contrast in the distal small bowel. There is no bowel dilatation or air-fluid level suggesting obstruction currently. No free air. Nasogastric tube tip and side port are in the proximal stomach. There is postoperative change along the abdominal wall. Lung bases are clear. IMPRESSION: Nasogastric tube tip and side port in stomach. No bowel dilatation or air-fluid level suggesting obstruction. No free air appreciable. Electronically Signed   By: Bretta Bang III M.D.   On: 09/15/2015 10:40    Microbiology: No results found for  this or any previous visit (from the past 240 hour(s)).   Labs: Basic Metabolic Panel:  Recent Labs Lab 09/13/15 1750 09/14/15 0410  NA 138 139  K 4.6 4.8  CL 104 104  CO2 26 27  GLUCOSE 233* 277*  BUN 33* 24*  CREATININE 1.07* 0.90  CALCIUM 9.3 8.9   Liver Function Tests:  Recent Labs Lab 09/13/15 1750  AST 21  ALT 20  ALKPHOS 77  BILITOT 0.4  PROT 7.6  ALBUMIN 4.0    Recent Labs Lab 09/13/15 1750  LIPASE 20   No results for input(s): AMMONIA in the last 168 hours. CBC:  Recent Labs Lab 09/13/15 1750 09/14/15 0410  WBC 11.4* 9.7  HGB 11.9* 11.0*  HCT 38.6 35.0*  MCV 92.8 91.6  PLT 311 263   Cardiac Enzymes: No results for input(s): CKTOTAL,  CKMB, CKMBINDEX, TROPONINI in the last 168 hours. BNP: BNP (last 3 results) No results for input(s): BNP in the last 8760 hours.  ProBNP (last 3 results) No results for input(s): PROBNP in the last 8760 hours.  CBG:  Recent Labs Lab 09/14/15 2050 09/14/15 2356 09/15/15 0409 09/15/15 0800 09/15/15 1215  GLUCAP 212* 168* 187* 185* 175*    Active Problems:   Bowel obstruction (HCC)   Small bowel obstruction (HCC)   Time coordinating discharge: <30 mins   Signed:  Anija Brickner, ANP-BC

## 2015-09-15 NOTE — Progress Notes (Signed)
Discharge instructions given to patient. Questions answered 

## 2015-09-15 NOTE — Progress Notes (Signed)
Patient ID: [name], female   DOB: 1945-12-08, 70 y.o.   MRN: [id number]     [place]      [address] [place], [place] [address]    Phone: [phone] FAX: [phone]     Subjective: Had nausea yesterday, none since then.  Passing flatus. 980m from NGT.   Objective:  Vital signs:  Filed Vitals:   09/14/15 1005 09/14/15 1408 09/14/15 2151 09/15/15 0510  BP: 156/72 158/68 166/64 178/71  Pulse:  98 112 105  Temp:  98.4 F (36.9 C) 99.2 F (37.3 C) 98.2 F (36.8 C)  TempSrc:  Oral Oral Oral  Resp:  '16 14 18  ' Height:      Weight:      SpO2:  97% 96% 94%    Last BM Date: 09/13/15  Intake/Output   Yesterday:  01/11 0701 - 01/12 0700 In: 1920 [P.O.:120; I.V.:1800] Out: 2600 [Urine:1700; Emesis/NG output:900] This shift:     Physical Exam: General: Pt awake/alert/oriented x4 in no acute distress  Abdomen: Soft.  Nondistended.  Non tender.  No evidence of peritonitis.  No incarcerated hernias.   Problem List:   Active Problems:   Bowel obstruction (HCC)   Small bowel obstruction (HCC)    Results:   Labs: Results for orders placed or performed during the hospital encounter of 09/13/15 (from the past 48 hour(s))  Lipase, blood     Status: None   Collection Time: 09/13/15  5:50 PM  Result Value Ref Range   Lipase 20 11 - 51 U/L  Comprehensive metabolic panel     Status: Abnormal   Collection Time: 09/13/15  5:50 PM  Result Value Ref Range   Sodium 138 135 - 145 mmol/L   Potassium 4.6 3.5 - 5.1 mmol/L   Chloride 104 101 - 111 mmol/L   CO2 26 22 - 32 mmol/L   Glucose, Bld 233 (H) 65 - 99 mg/dL   BUN 33 (H) 6 - 20 mg/dL   Creatinine, Ser 1.07 (H) 0.44 - 1.00 mg/dL   Calcium 9.3 8.9 - 10.3 mg/dL   Total Protein 7.6 6.5 - 8.1 g/dL   Albumin 4.0 3.5 - 5.0 g/dL   AST 21 15 - 41 U/L   ALT 20 14 - 54 U/L   Alkaline Phosphatase 77 38 - 126 U/L   Total Bilirubin 0.4 0.3 - 1.2 mg/dL   GFR calc non  Af Amer 52 (L) >60 mL/min   GFR calc Af Amer 60 (L) >60 mL/min    Comment: (NOTE) The eGFR has been calculated using the CKD EPI equation. This calculation has not been validated in all clinical situations. eGFR's persistently <60 mL/min signify possible Chronic Kidney Disease.    Anion gap 8 5 - 15  CBC     Status: Abnormal   Collection Time: 09/13/15  5:50 PM  Result Value Ref Range   WBC 11.4 (H) 4.0 - 10.5 K/uL   RBC 4.16 3.87 - 5.11 MIL/uL   Hemoglobin 11.9 (L) 12.0 - 15.0 g/dL   HCT 38.6 36.0 - 46.0 %   MCV 92.8 78.0 - 100.0 fL   MCH 28.6 26.0 - 34.0 pg   MCHC 30.8 30.0 - 36.0 g/dL   RDW 14.9 11.5 - 15.5 %   Platelets 311 150 - 400 K/uL  Glucose, capillary     Status: Abnormal   Collection Time: 09/14/15  1:18 AM  Result Value Ref Range  Glucose-Capillary 247 (H) 65 - 99 mg/dL  Urinalysis, Routine w reflex microscopic (not at [facility])     Status: Abnormal   Collection Time: 09/14/15  2:54 AM  Result Value Ref Range   Color, Urine YELLOW YELLOW   APPearance CLEAR CLEAR   Specific Gravity, Urine 1.012 1.005 - 1.030   pH 5.5 5.0 - 8.0   Glucose, UA 100 (A) NEGATIVE mg/dL   Hgb urine dipstick NEGATIVE NEGATIVE   Bilirubin Urine NEGATIVE NEGATIVE   Ketones, ur NEGATIVE NEGATIVE mg/dL   Protein, ur NEGATIVE NEGATIVE mg/dL   Nitrite NEGATIVE NEGATIVE   Leukocytes, UA NEGATIVE NEGATIVE    Comment: MICROSCOPIC NOT DONE ON URINES WITH NEGATIVE PROTEIN, BLOOD, LEUKOCYTES, NITRITE, OR GLUCOSE <1000 mg/dL.  CBC     Status: Abnormal   Collection Time: 09/14/15  4:10 AM  Result Value Ref Range   WBC 9.7 4.0 - 10.5 K/uL   RBC 3.82 (L) 3.87 - 5.11 MIL/uL   Hemoglobin 11.0 (L) 12.0 - 15.0 g/dL   HCT 35.0 (L) 36.0 - 46.0 %   MCV 91.6 78.0 - 100.0 fL   MCH 28.8 26.0 - 34.0 pg   MCHC 31.4 30.0 - 36.0 g/dL   RDW 14.7 11.5 - 15.5 %   Platelets 263 150 - 400 K/uL  Basic metabolic panel     Status: Abnormal   Collection Time: 09/14/15  4:10 AM  Result Value Ref Range   Sodium 139  135 - 145 mmol/L   Potassium 4.8 3.5 - 5.1 mmol/L   Chloride 104 101 - 111 mmol/L   CO2 27 22 - 32 mmol/L   Glucose, Bld 277 (H) 65 - 99 mg/dL   BUN 24 (H) 6 - 20 mg/dL   Creatinine, Ser 0.90 0.44 - 1.00 mg/dL   Calcium 8.9 8.9 - 10.3 mg/dL   GFR calc non Af Amer >60 >60 mL/min   GFR calc Af Amer >60 >60 mL/min    Comment: (NOTE) The eGFR has been calculated using the CKD EPI equation. This calculation has not been validated in all clinical situations. eGFR's persistently <60 mL/min signify possible Chronic Kidney Disease.    Anion gap 8 5 - 15  Glucose, capillary     Status: Abnormal   Collection Time: 09/14/15  5:48 AM  Result Value Ref Range   Glucose-Capillary 249 (H) 65 - 99 mg/dL  Glucose, capillary     Status: Abnormal   Collection Time: 09/14/15  7:54 AM  Result Value Ref Range   Glucose-Capillary 219 (H) 65 - 99 mg/dL  Glucose, capillary     Status: Abnormal   Collection Time: 09/14/15 11:42 AM  Result Value Ref Range   Glucose-Capillary 199 (H) 65 - 99 mg/dL  Glucose, capillary     Status: Abnormal   Collection Time: 09/14/15  4:02 PM  Result Value Ref Range   Glucose-Capillary 146 (H) 65 - 99 mg/dL  Glucose, capillary     Status: Abnormal   Collection Time: 09/14/15  8:50 PM  Result Value Ref Range   Glucose-Capillary 212 (H) 65 - 99 mg/dL  Glucose, capillary     Status: Abnormal   Collection Time: 09/14/15 11:56 PM  Result Value Ref Range   Glucose-Capillary 168 (H) 65 - 99 mg/dL  Glucose, capillary     Status: Abnormal   Collection Time: 09/15/15  4:09 AM  Result Value Ref Range   Glucose-Capillary 187 (H) 65 - 99 mg/dL  Glucose, capillary     Status:  Abnormal   Collection Time: 09/15/15  8:00 AM  Result Value Ref Range   Glucose-Capillary 185 (H) 65 - 99 mg/dL    Imaging / Studies: Ct Abdomen Pelvis Wo Contrast  09/13/2015  CLINICAL DATA:  Generalized abdominal pain after lunch. Diarrhea. History of Crohn's disease. EXAM: CT ABDOMEN AND PELVIS  WITHOUT CONTRAST TECHNIQUE: Multidetector CT imaging of the abdomen and pelvis was performed following the standard protocol without IV contrast. COMPARISON:  None. FINDINGS: Lower chest: Lung bases are clear. Normal heart size. Mitral annular calcification. Hepatobiliary: Normal liver and gallbladder. Pancreas: Normal. Spleen: Normal. Adrenals/Urinary Tract: Normal adrenal glands. No urolithiasis or obstructive uropathy. Normal kidneys. Normal bladder. Stomach/Bowel: Small bowel dilatation measuring up to 4 cm with decompressed distal small bowel. There is a relative transition point in the right mid abdomen (image 36/series 2). Prior small bowel anastomosis. No pneumatosis, pneumoperitoneum or portal venous gas. No significant pelvic or abdominal free fluid. Vascular/Lymphatic: Normal caliber abdominal aorta. No lymphadenopathy. Reproductive: Normal uterus.  No adnexal mass. Other: No focal fluid collection or hematoma. Musculoskeletal: No lytic or sclerotic osseous lesion. There is degenerative disc disease with a broad-based disc bulge at L5-S1. There is a chronic anterior compression fracture of the T12 vertebral body. IMPRESSION: 1. Small bowel obstruction with a relative transition point along the anterior right mid abdomen (image 36/series 2). Electronically Signed   By: Kathreen Devoid   On: 09/13/2015 20:19    Medications / Allergies:  Scheduled Meds: . amLODipine  2.5 mg Oral Daily  . buPROPion  300 mg Oral Daily  . enoxaparin (LOVENOX) injection  40 mg Subcutaneous Q24H  . hydrochlorothiazide  12.5 mg Oral Daily  . insulin aspart  0-24 Units Subcutaneous 6 times per day  . lidocaine  1 application Topical Once  . lidocaine  15 mL Mouth/Throat Once  . lisinopril  20 mg Oral Daily  . pantoprazole (PROTONIX) IV  40 mg Intravenous QHS   Continuous Infusions: . sodium chloride 75 mL/hr at 09/15/15 0733   PRN Meds:.hydrALAZINE, morphine injection, ondansetron **OR** ondansetron (ZOFRAN) IV,  phenol  Antibiotics: Anti-infectives    None        Assessment/Plan SBO-AXR now.  NGT to suction.  Await films, if there is progression of contrast to colon, DC and start clears.  DM II-SBGs/SSI HTN-home meds VTE prophylaxis-SCD/lovenox   Erby Pian, [facility] Pager [phone]([id number])   09/15/2015 10:02 AM

## 2015-09-15 NOTE — Discharge Instructions (Signed)
Start with low fiber diet for the next week and then resume high fiber as tolerated   Low-Fiber Diet Fiber is found in fruits, vegetables, and whole grains. A low-fiber diet restricts fibrous foods that are not digested in the small intestine. A diet containing about 10-15 grams of fiber per day is considered low fiber. Low-fiber diets may be used to:  Promote healing and rest the bowel during intestinal flare-ups.  Prevent blockage of a partially obstructed or narrowed gastrointestinal tract.  Reduce fecal weight and volume.  Slow the movement of feces. You may be on a low-fiber diet as a transitional diet following surgery, after an injury (trauma), or because of a short (acute) or lifelong (chronic) illness. Your health care provider will determine the length of time you need to stay on this diet.  WHAT DO I NEED TO KNOW ABOUT A LOW-FIBER DIET? Always check the fiber content on the packaging's Nutrition Facts label, especially on foods from the grains list. Ask your dietitian if you have questions about specific foods that are related to your condition, especially if the food is not listed below. In general, a low-fiber food will have less than 2 g of fiber. WHAT FOODS CAN I EAT? Grains All breads and crackers made with white flour. Sweet rolls, doughnuts, waffles, pancakes, [place] toast, bagels. Pretzels, Melba toast, zwieback. Well-cooked cereals, such as cornmeal, farina, or cream cereals. Dry cereals that do not contain whole grains, fruit, or nuts, such as refined corn, wheat, rice, and oat cereals. Potatoes prepared any way without skins, plain pastas and noodles, refined white rice. Use white flour for baking and making sauces. Use allowed list of grains for casseroles, dumplings, and puddings.  Vegetables Strained tomato and vegetable juices. Fresh lettuce, cucumber, spinach. Well-cooked (no skin or pulp) or canned vegetables, such as asparagus, bean sprouts, beets, carrots, green  beans, mushrooms, potatoes, pumpkin, spinach, yellow squash, tomato sauce/puree, turnips, yams, and zucchini. Keep servings limited to  cup.  Fruits All fruit juices except prune juice. Cooked or canned fruits without skin and seeds, such as applesauce, apricots, cherries, fruit cocktail, grapefruit, grapes, mandarin oranges, melons, peaches, pears, pineapple, and plums. Fresh fruits without skin, such as apricots, avocados, bananas, melons, pineapple, nectarines, and peaches. Keep servings limited to  cup or 1 piece.  Meat and Other Protein Sources Ground or well-cooked tender beef, ham, veal, lamb, pork, or poultry. Eggs, plain cheese. Fish, oysters, shrimp, lobster, and other seafood. Liver, organ meats. Smooth nut butters. Dairy All milk products and alternative dairy substitutes, such as soy, rice, almond, and coconut, not containing added whole nuts, seeds, or added fruit. Beverages Decaf coffee, fruit, and vegetable juices or smoothies (small amounts, with no pulp or skins, and with fruits from allowed list), sports drinks, herbal tea. Condiments Ketchup, mustard, vinegar, cream sauce, cheese sauce, cocoa powder. Spices in moderation, such as allspice, basil, bay leaves, celery powder or leaves, cinnamon, cumin powder, curry powder, ginger, mace, marjoram, onion or garlic powder, oregano, paprika, parsley flakes, ground pepper, rosemary, sage, savory, tarragon, thyme, and turmeric. Sweets and Desserts Plain cakes and cookies, pie made with allowed fruit, pudding, custard, cream pie. Gelatin, fruit, ice, sherbet, frozen ice pops. Ice cream, ice milk without nuts. Plain hard candy, honey, jelly, molasses, syrup, sugar, chocolate syrup, gumdrops, marshmallows. Limit overall sugar intake.  Fats and Oil Margarine, butter, cream, mayonnaise, salad oils, plain salad dressings made from allowed foods. Choose healthy fats such as olive oil, canola oil, and omega-3 fatty  acids (such as found in salmon  or tuna) when possible.  Other Bouillon, broth, or cream soups made from allowed foods. Any strained soup. Casseroles or mixed dishes made with allowed foods. The items listed above may not be a complete list of recommended foods or beverages. Contact your dietitian for more options.  WHAT FOODS ARE NOT RECOMMENDED? Grains All whole wheat and whole grain breads and crackers. Multigrains, rye, bran seeds, nuts, or coconut. Cereals containing whole grains, multigrains, bran, coconut, nuts, raisins. Cooked or dry oatmeal, steel-cut oats. Coarse wheat cereals, granola. Cereals advertised as high fiber. Potato skins. Whole grain pasta, wild or brown rice. Popcorn. Coconut flour. Bran, buckwheat, corn bread, multigrains, rye, wheat germ.  Vegetables Fresh, cooked or canned vegetables, such as artichokes, asparagus, beet greens, broccoli, Brussels sprouts, cabbage, celery, cauliflower, corn, eggplant, kale, legumes or beans, okra, peas, and tomatoes. Avoid large servings of any vegetables, especially raw vegetables.  Fruits Fresh fruits, such as apples with or without skin, berries, cherries, figs, grapes, grapefruit, guavas, kiwis, mangoes, oranges, papayas, pears, persimmons, pineapple, and pomegranate. Prune juice and juices with pulp, stewed or dried prunes. Dried fruits, dates, raisins. Fruit seeds or skins. Avoid large servings of all fresh fruits. Meats and Other Protein Sources Tough, fibrous meats with gristle. Chunky nut butter. Cheese made with seeds, nuts, or other foods not recommended. Nuts, seeds, legumes (beans, including baked beans), dried peas, beans, lentils.  Dairy Yogurt or cheese that contains nuts, seeds, or added fruit.  Beverages Fruit juices with high pulp, prune juice. Caffeinated coffee and teas.  Condiments Coconut, maple syrup, pickles, olives. Sweets and Desserts Desserts, cookies, or candies that contain nuts or coconut, chunky peanut butter, dried fruits. Jams,  preserves with seeds, marmalade. Large amounts of sugar and sweets. Any other dessert made with fruits from the not recommended list.  Other Soups made from vegetables that are not recommended or that contain other foods not recommended.  The items listed above may not be a complete list of foods and beverages to avoid. Contact your dietitian for more information.   This information is not intended to replace advice given to you by your health care provider. Make sure you discuss any questions you have with your health care provider.   Document Released: 02/09/2002 Document Revised: 08/25/2013 Document Reviewed: 07/13/2013 Elsevier Interactive Patient Education 2016 Elsevier Inc.    High-Fiber Diet Fiber, also called dietary fiber, is a type of carbohydrate found in fruits, vegetables, whole grains, and beans. A high-fiber diet can have many health benefits. Your health care provider may recommend a high-fiber diet to help:  Prevent constipation. Fiber can make your bowel movements more regular.  Lower your cholesterol.  Relieve hemorrhoids, uncomplicated diverticulosis, or irritable bowel syndrome.  Prevent overeating as part of a weight-loss plan.  Prevent heart disease, type 2 diabetes, and certain cancers. WHAT IS MY PLAN? The recommended daily intake of fiber includes:  38 grams for men under age 33.  30 grams for men over age 55.  25 grams for women under age 59.  21 grams for women over age 34. You can get the recommended daily intake of dietary fiber by eating a variety of fruits, vegetables, grains, and beans. Your health care provider may also recommend a fiber supplement if it is not possible to get enough fiber through your diet. WHAT DO I NEED TO KNOW ABOUT A HIGH-FIBER DIET?  Fiber supplements have not been widely studied for their effectiveness, so it is better  to get fiber through food sources.  Always check the fiber content on thenutrition facts label of any  prepackaged food. Look for foods that contain at least 5 grams of fiber per serving.  Ask your dietitian if you have questions about specific foods that are related to your condition, especially if those foods are not listed in the following section.  Increase your daily fiber consumption gradually. Increasing your intake of dietary fiber too quickly may cause bloating, cramping, or gas.  Drink plenty of water. Water helps you to digest fiber. WHAT FOODS CAN I EAT? Grains Whole-grain breads. Multigrain cereal. Oats and oatmeal. Brown rice. Barley. Bulgur wheat. Millet. Bran muffins. Popcorn. Rye wafer crackers. Vegetables Sweet potatoes. Spinach. Kale. Artichokes. Cabbage. Broccoli. Green peas. Carrots. Squash. Fruits Berries. Pears. Apples. Oranges. Avocados. Prunes and raisins. Dried figs. Meats and Other Protein Sources Navy, kidney, pinto, and soy beans. Split peas. Lentils. Nuts and seeds. Dairy Fiber-fortified yogurt. Beverages Fiber-fortified soy milk. Fiber-fortified orange juice. Other Fiber bars. The items listed above may not be a complete list of recommended foods or beverages. Contact your dietitian for more options. WHAT FOODS ARE NOT RECOMMENDED? Grains White bread. Pasta made with refined flour. White rice. Vegetables Fried potatoes. Canned vegetables. Well-cooked vegetables.  Fruits Fruit juice. Cooked, strained fruit. Meats and Other Protein Sources Fatty cuts of meat. Fried Environmental education officer or fried fish. Dairy Milk. Yogurt. Cream cheese. Sour cream. Beverages Soft drinks. Other Cakes and pastries. Butter and oils. The items listed above may not be a complete list of foods and beverages to avoid. Contact your dietitian for more information. WHAT ARE SOME TIPS FOR INCLUDING HIGH-FIBER FOODS IN MY DIET?  Eat a wide variety of high-fiber foods.  Make sure that half of all grains consumed each day are whole grains.  Replace breads and cereals made from refined flour  or white flour with whole-grain breads and cereals.  Replace white rice with brown rice, bulgur wheat, or millet.  Start the day with a breakfast that is high in fiber, such as a cereal that contains at least 5 grams of fiber per serving.  Use beans in place of meat in soups, salads, or pasta.  Eat high-fiber snacks, such as berries, raw vegetables, nuts, or popcorn.   This information is not intended to replace advice given to you by your health care provider. Make sure you discuss any questions you have with your health care provider.   Document Released: 08/20/2005 Document Revised: 09/10/2014 Document Reviewed: 02/02/2014 Elsevier Interactive Patient Education [employer]

## 2015-09-22 DIAGNOSIS — I129 Hypertensive chronic kidney disease with stage 1 through stage 4 chronic kidney disease, or unspecified chronic kidney disease: Secondary | ICD-10-CM | POA: Diagnosis not present

## 2015-09-22 DIAGNOSIS — E785 Hyperlipidemia, unspecified: Secondary | ICD-10-CM | POA: Diagnosis not present

## 2015-09-22 DIAGNOSIS — E1065 Type 1 diabetes mellitus with hyperglycemia: Secondary | ICD-10-CM | POA: Diagnosis not present

## 2015-09-22 DIAGNOSIS — G459 Transient cerebral ischemic attack, unspecified: Secondary | ICD-10-CM | POA: Diagnosis not present

## 2015-09-26 ENCOUNTER — Ambulatory Visit (INDEPENDENT_AMBULATORY_CARE_PROVIDER_SITE_OTHER): Payer: BLUE CROSS/BLUE SHIELD | Admitting: Diagnostic Neuroimaging

## 2015-09-26 ENCOUNTER — Encounter: Payer: Self-pay | Admitting: Diagnostic Neuroimaging

## 2015-09-26 VITALS — BP 135/75 | HR 82 | Ht 64.0 in | Wt 162.8 lb

## 2015-09-26 DIAGNOSIS — Z720 Tobacco use: Secondary | ICD-10-CM

## 2015-09-26 DIAGNOSIS — E1169 Type 2 diabetes mellitus with other specified complication: Secondary | ICD-10-CM

## 2015-09-26 DIAGNOSIS — I10 Essential (primary) hypertension: Secondary | ICD-10-CM

## 2015-09-26 DIAGNOSIS — R0683 Snoring: Secondary | ICD-10-CM

## 2015-09-26 DIAGNOSIS — I63412 Cerebral infarction due to embolism of left middle cerebral artery: Secondary | ICD-10-CM

## 2015-09-26 NOTE — Patient Instructions (Addendum)
Thank you for coming to see [place] at [facility]. I hope we have been able to provide you high quality care today.  You may receive a patient satisfaction survey over the next few weeks. We would appreciate your feedback and comments so that we may continue to improve ourselves and the health of our patients.  - continue current medications - continue to try cutting down and quitting cigarette smoking - check sleep study   ~~~~~~~~~~~~~~~~~~~~~~~~~~~~~~~~~~~~~~~~~~~~~~~~~~~~~~~~~~~~~~~~~  DR. Alizea Pell'S GUIDE TO HAPPY AND HEALTHY LIVING These are some of my general health and wellness recommendations. Some of them may apply to you better than others. Please use common sense as you try these suggestions and feel free to ask me any questions.   ACTIVITY/FITNESS Mental, social, emotional and physical stimulation are very important for brain and body health. Try learning a new activity (arts, music, language, sports, games).  Keep moving your body to the best of your abilities. You can do this at home, inside or outside, the park, community center, gym or anywhere you like. Consider a physical therapist or personal trainer to get started. Consider the app Sworkit. Fitness trackers such as smart-watches, smart-phones or Fitbits can help as well.   NUTRITION Eat more plants: colorful vegetables, nuts, seeds and berries.  Eat less sugar, salt, preservatives and processed foods.  Avoid toxins such as cigarettes and alcohol.  Drink water when you are thirsty. Warm water with a slice of lemon is an excellent morning drink to start the day.  Consider these websites for more information The Nutrition Source (https://www.henry-hernandez.biz/) Precision Nutrition (WindowBlog.ch)   RELAXATION Consider practicing mindfulness meditation or other relaxation techniques such as deep breathing, prayer, yoga, tai chi, massage. See website  mindful.org or the apps Headspace or Calm to help get started.   SLEEP Try to get at least 7-8+ hours sleep per day. Regular exercise and reduced caffeine will help you sleep better. Practice good sleep hygeine techniques. See website sleep.org for more information.   PLANNING Prepare estate planning, living will, healthcare POA documents. Sometimes this is best planned with the help of an attorney. Theconversationproject.org and agingwithdignity.org are excellent resources.

## 2015-09-26 NOTE — Progress Notes (Signed)
[facility]  PATIENT: [name] DOB: [birth year]  REFERRING CLINICIAN: Thea Silversmith HISTORY FROM: patient  REASON FOR VISIT: follow up   HISTORICAL  CHIEF COMPLAINT:  Chief Complaint  Patient presents with  . Cerebrovascular Accident    rm 6, dgtr- Brandy  . Follow-up    6 week    HISTORY OF PRESENT ILLNESS:   UPDATE 09/26/15: Since last visit, sxs stable. No new stroke sx. Had admit for small bowel obstruction 09/13/15 - 09/15/15. Avg 5 cig per day.   PRIOR HPI 08/11/15 ([facility]): 70 year old female with hypertension, diabetes, hypercholesteremia here for evaluation of TIA versus stroke. 07/31/15 patient woke up 5:00 in the morning, was visiting family in [place], when she had sudden onset of right arm numbness and weakness. Symptoms lasted for almost one hour. Patient went to local emergency room and was admitted for TIA/stroke workup. MRI of the brain showed a few punctate foci of subacute infarcts in the left parietal region. CT angiogram of the head and neck demonstrated right internal carotid artery stenosis of 60-65%, felt to be asymptomatic, and patient was discharged home on aspirin. No recurrent symptoms. Patient's last hemoglobin A1c was 7.1. Patient is currently on aspirin 81 mg daily, blood pressure medications, insulin, supplements and Wellbutrin. Patient has long history of cigarette smoking and has cut down in the last 1 year. Now she is averaging 5 cigarettes per day. Patient is not sure if she had ultrasound of the heart. She denies chest pain, shortness of breath or palpitations.   REVIEW OF SYSTEMS: Full 14 system review of systems performed and notable only for as per HPI.    ALLERGIES: Allergies  Allergen Reactions  . Simvastatin Other (See Comments)    Muscle cramps    HOME MEDICATIONS: Outpatient Prescriptions Prior to Visit  Medication Sig Dispense Refill  . amLODipine (NORVASC) 2.5 MG tablet Take 2.5 mg by mouth daily.    Marland Kitchen aspirin  81 MG tablet Take 81 mg by mouth daily.    . Biotin 5000 MCG TABS Take 5,000 mcg by mouth daily.     Marland Kitchen buPROPion (WELLBUTRIN XL) 300 MG 24 hr tablet Take 300 mg by mouth daily.     Marland Kitchen CALCIUM PO Take 1 tablet by mouth daily.      . Cholecalciferol (VITAMIN D PO) Take 800 Units by mouth daily.     Marland Kitchen CINNAMON PO Take 500 mg by mouth daily.    . fish oil-omega-3 fatty acids 1000 MG capsule Take 2 g by mouth daily.      Marland Kitchen GLUCOSAMINE SULFATE PO Take 500 mg by mouth daily.     . insulin NPH Human (HUMULIN N,NOVOLIN N) 100 UNIT/ML injection Inject 0-10 Units into the skin 3 (three) times daily before meals. Per sliding scale    . insulin regular (NOVOLIN R,HUMULIN R) 100 units/mL injection Inject 22 Units into the skin 2 (two) times daily before a meal.    . Insulin Regular Human (RELION R IJ) Inject 8-10 Units as directed 3 (three) times daily with meals. Breakfast: 8 units Lunch: 10 units Dinner: 10 units    . Lactobacillus (ACIDOPHILUS) CAPS capsule Take 1 capsule by mouth daily.    Marland Kitchen lisinopril-hydrochlorothiazide (PRINZIDE,ZESTORETIC) 20-12.5 MG per tablet Take 2 tablets by mouth daily.     Marland Kitchen lovastatin (MEVACOR) 20 MG tablet Take 20 mg by mouth at bedtime.    Marland Kitchen omeprazole (PRILOSEC) 40 MG capsule Take 40 mg by mouth daily.     Marland Kitchen  vitamin E 100 UNIT capsule Take 100 Units by mouth daily.       No facility-administered medications prior to visit.    PAST MEDICAL HISTORY: Past Medical History  Diagnosis Date  . Diabetes mellitus     age 3's  . Hypertension   . GERD (gastroesophageal reflux disease)   . Osteopenia   . Crohn's colitis (HCC)   . Hypercholesterolemia   . Anemia   . Osteopenia   . TIA (transient ischemic attack)   . SBO (small bowel obstruction) (HCC) 09/13/15    PAST SURGICAL HISTORY: Past Surgical History  Procedure Laterality Date  . Colon surgery  07/08/2007  . Ileostomy  02/26/2008    reversal  . Hernia repair  11/21/2009    FAMILY HISTORY: Family History    Problem Relation Age of Onset  . Stroke Mother   . Heart attack Father   . Diabetes Brother   . Hypertension Daughter     SOCIAL HISTORY:  Social History   Social History  . Marital Status: Married    Spouse Name: N/A  . Number of Children: N/A  . Years of Education: N/A   Occupational History  . Not on file.   Social History Main Topics  . Smoking status: Current Every Day Smoker    Types: Cigarettes  . Smokeless tobacco: Never Used     Comment: 09/26/15 5 cigs daily  . Alcohol Use: No  . Drug Use: No  . Sexual Activity: Not on file   Other Topics Concern  . Not on file   Social History Narrative     PHYSICAL EXAM  GENERAL EXAM/CONSTITUTIONAL: Vitals:  Filed Vitals:   09/26/15 1415  BP: 135/75  Pulse: 82  Height:  (1.626 m)  Weight: 162 lb 12.8 oz (73.846 kg)   Body mass index is 27.93 kg/(m^2). No exam data present  Patient is in no distress; well developed, nourished and groomed; neck is supple  CARDIOVASCULAR:  Examination of carotid arteries is normal; no carotid bruits  Regular rate and rhythm, no murmurs  Examination of peripheral vascular system by observation and palpation is normal  NO SUPRA-CLAVICULAR BRUITS  EYES:  Ophthalmoscopic exam of optic discs and posterior segments is normal; no papilledema or hemorrhages  MUSCULOSKELETAL:  Gait, strength, tone, movements noted in Neurologic exam below  NEUROLOGIC: MENTAL STATUS:  No flowsheet data found.  awake, alert, oriented to person, place and time  recent and remote memory intact  normal attention and concentration  language fluent, comprehension intact, naming intact,   fund of knowledge appropriate  CRANIAL NERVE:   2nd, 3rd, 4th, 6th - pupils equal and reactive to light, visual fields full to confrontation, extraocular muscles intact, no nystagmus  5th - facial sensation symmetric  7th - facial strength symmetric  8th - hearing intact  9th - palate  elevates symmetrically, uvula midline  11th - shoulder shrug symmetric  12th - tongue protrusion midline  MOTOR:   normal bulk and tone, full strength in the BUE, BLE  SENSORY:   normal and symmetric to light touch, temperature, vibration  COORDINATION:   finger-nose-finger, fine finger movements, foot tapping symm  REFLEXES:   deep tendon reflexes present and symmetric  GAIT/STATION:   narrow based gait; able to walk tandem; romberg is negative    DIAGNOSTIC DATA (LABS, IMAGING, TESTING) - I reviewed patient records, labs, notes, testing and imaging myself where available.  Lab Results  Component Value Date   WBC 9.7 09/14/2015  HGB 11.0* 09/14/2015   HCT 35.0* 09/14/2015   MCV 91.6 09/14/2015   PLT 263 09/14/2015      Component Value Date/Time   NA 139 09/14/2015 0410   K 4.8 09/14/2015 0410   CL 104 09/14/2015 0410   CO2 27 09/14/2015 0410   GLUCOSE 277* 09/14/2015 0410   BUN 24* 09/14/2015 0410   CREATININE 0.90 09/14/2015 0410   CALCIUM 8.9 09/14/2015 0410   PROT 7.6 09/13/2015 1750   ALBUMIN 4.0 09/13/2015 1750   AST 21 09/13/2015 1750   ALT 20 09/13/2015 1750   ALKPHOS 77 09/13/2015 1750   BILITOT 0.4 09/13/2015 1750   GFRNONAA >60 09/14/2015 0410   GFRAA >60 09/14/2015 0410   No results found for: CHOL, HDL, LDLCALC, LDLDIRECT, TRIG, CHOLHDL Lab Results  Component Value Date   HGBA1C * 10/21/2009    7.9 (NOTE) The ADA recommends the following therapeutic goal for glycemic control related to Hgb A1c measurement: Goal of therapy: <6.5 Hgb A1c  Reference: American Diabetes Association: Clinical Practice Recommendations 2010, Diabetes Care, 2010, 33: (Suppl  1).   Lab Results  Component Value Date   VITAMINB12 491 09/11/2007   Lab Results  Component Value Date   TSH 2.305 08/22/2015     07/31/15 MRI brain [report only]  - 3-4 small foci of subacute infarcts in the left parietal lobe - mild atrophy and chronic small vessel ischemic  disease   07/31/15 CTA head/neck [report only] - no intracranial stenosis - no aneurysm - 60-65% proximal right ICA stenosis - no left ICA stenosis - no vertebral artery dissection or stenosis  09/02/15 carotid u/s - bilateral 40-59% ICA stenosis   09/07/15 TTE  - Left ventricle: The cavity size was normal. Wall thickness was normal. Systolic function was normal. The estimated ejection fraction was in the range of 55% to 60%. Wall motion was normal; there were no regional wall motion abnormalities. Doppler parameters are consistent with abnormal left ventricular relaxation (grade 1 diastolic dysfunction). - Mitral valve: Moderately to severely calcified annulus with associated mobile calcified tissue (? possible source of CVA).    ASSESSMENT AND PLAN  70 y.o. year old female here with mild left brain stroke, 07/31/15, possibly related to small vessel disease versus cryptogenic embolic source. Continue stroke risk factor reduction per PCP.   Dx:   Cerebrovascular accident (CVA) due to embolism of left middle cerebral artery (HCC)  Type 2 diabetes mellitus with other specified complication (HCC)  Accelerated hypertension  Tobacco abuse    PLAN: - follow up with Dr. Allyson Sabal (cardiology) --> re: mitral valve calcification - continue aspirin 81mg  daily - continue diabetes, HTN and lipid control per PCP - continue smoking cessation - nutrition and exercise reviewed  - check sleep study (stroke, HTN, snoring)  Orders Placed This Encounter  Procedures  . Ambulatory referral to Sleep Studies   Return if symptoms worsen or fail to improve, for return to PCP.    Suanne Marker, MD 09/26/2015, 2:51 PM Certified in Neurology, Neurophysiology and Neuroimaging  [facility] [address] [place], [place] [address] [phone]

## 2015-09-27 ENCOUNTER — Telehealth: Payer: Self-pay | Admitting: *Deleted

## 2015-09-27 DIAGNOSIS — Z0289 Encounter for other administrative examinations: Secondary | ICD-10-CM

## 2015-09-27 NOTE — Telephone Encounter (Signed)
Attempted to reach patient on home phone; no answer, unable to leave message.  LVM on mobile requesting call back re: [employer] claim form. Left this caller's name, number.

## 2015-09-27 NOTE — Telephone Encounter (Signed)
Form,[employer] received from [facility] sent to [facility] and Dr Marjory Lies 09/27/15.

## 2015-09-28 ENCOUNTER — Telehealth: Payer: Self-pay | Admitting: *Deleted

## 2015-09-28 NOTE — Telephone Encounter (Signed)
Form,[employer] received,completed by Pincus Sanes and Dr Marjory Lies  faxed 09/28/15.

## 2015-09-28 NOTE — Telephone Encounter (Signed)
Patient is calling to give an address. It is [address] [name]  [address].

## 2015-09-28 NOTE — Telephone Encounter (Addendum)
LVM requesting call back. Left this caller's name, number.  8:50 am  Received a call back from patient. Received answers re: [employer] form. She will call back with further information; phone staff may take message, no need to speak with this RN again.

## 2015-09-28 NOTE — Telephone Encounter (Signed)
[employer] forms reviewed, completed, signed by Dr Marjory Lies. Sent to MR for scanning, faxing.

## 2015-09-28 NOTE — Telephone Encounter (Signed)
Patient is returning a call. °

## 2015-09-29 ENCOUNTER — Telehealth: Payer: Self-pay | Admitting: *Deleted

## 2015-09-29 NOTE — Telephone Encounter (Signed)
Form faxed 09/27/14.

## 2015-10-05 ENCOUNTER — Ambulatory Visit (INDEPENDENT_AMBULATORY_CARE_PROVIDER_SITE_OTHER): Payer: BLUE CROSS/BLUE SHIELD | Admitting: Cardiovascular Disease

## 2015-10-05 ENCOUNTER — Encounter: Payer: Self-pay | Admitting: Cardiovascular Disease

## 2015-10-05 VITALS — BP 140/80 | HR 98 | Ht 64.0 in | Wt 162.7 lb

## 2015-10-05 DIAGNOSIS — R9431 Abnormal electrocardiogram [ECG] [EKG]: Secondary | ICD-10-CM | POA: Diagnosis not present

## 2015-10-05 DIAGNOSIS — E785 Hyperlipidemia, unspecified: Secondary | ICD-10-CM

## 2015-10-05 DIAGNOSIS — I63412 Cerebral infarction due to embolism of left middle cerebral artery: Secondary | ICD-10-CM

## 2015-10-05 DIAGNOSIS — Z720 Tobacco use: Secondary | ICD-10-CM | POA: Diagnosis not present

## 2015-10-05 NOTE — Patient Instructions (Signed)
Medication Instructions:  Your physician recommends that you continue on your current medications as directed. Please refer to the Current Medication list given to you today.   Labwork: Your physician recommends that you return for lab work in: 7 days of procedure   Testing/Procedures: Your physician has requested that you have a TEE. During a TEE, sound waves are used to create images of your heart. It provides your doctor with information about the size and shape of your heart and how well your heart's chambers and valves are working. In this test, a transducer is attached to the end of a flexible tube that's guided down your throat and into your esophagus (the tube leading from you mouth to your stomach) to get a more detailed image of your heart. You are not awake for the procedure. Please see the instruction sheet given to you today. For further information please visit [email or web address]  SCHEDULE WITH DR. C.     Follow-Up: Your physician wants you to follow-up in: 6 MONTHS WITH DR. Allyson Sabal. You will receive a reminder letter in the mail two months in advance. If you don't receive a letter, please call our office to schedule the follow-up appointment.   Any Other Special Instructions Will Be Listed Below (If Applicable).     If you need a refill on your cardiac medications before your next appointment, please call your pharmacy.

## 2015-10-05 NOTE — Assessment & Plan Note (Signed)
History of tobacco abuse trying to cut back smoking now only one cigarette a day

## 2015-10-05 NOTE — Progress Notes (Signed)
10/05/2015 [name]   [birth year]  [id number]  Primary Physician Thayer Headings, MD Primary Cardiologist: Runell Gess MD [name]   HPI:  [name] is a 70 year old moderately overweight widowed Caucasian female mother of 4 living children, grandmother to 18 grandchildren referred by Dr. Ronne Binning for cardiovascular evaluation. I last saw her in the office 08/22/15. 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 Her cardiovascular status profile follows notable for 50-pack-years tobacco, treated hypertension, hypokalemia, diabetes and family history the father who died of a myocardial infarction at age 77. She has never had a heart attack but has had a TIA on 07/31/59 while in [place] visiting her family. This was characterized by right upper extremity paralysis which ultimately resolved. A CT scan showed 4 small left parietal lobe subacute infarcts. Carotid Doppler showed moderate right ICA stenosis. A 2-D echocardiogram showed a calcified mitral was with a questionable mobile calcified mass. Her primary care physician has further adjusted her antihypertensive medications recently.   Current Outpatient Prescriptions  Medication Sig Dispense Refill  . amLODipine (NORVASC) 5 MG tablet Take 5 mg by mouth daily.    Marland Kitchen aspirin 81 MG tablet Take 81 mg by mouth daily.    . Biotin 5000 MCG TABS Take 5,000 mcg by mouth daily.     Marland Kitchen buPROPion (WELLBUTRIN XL) 300 MG 24 hr tablet Take 300 mg by mouth daily.     Marland Kitchen CALCIUM PO Take 1 tablet by mouth daily.      . Cholecalciferol (VITAMIN D PO) Take 800 Units by mouth daily.     Marland Kitchen CINNAMON PO Take 500 mg by mouth daily.    . fish oil-omega-3 fatty acids 1000 MG capsule Take 2 g by mouth daily.      Marland Kitchen GLUCOSAMINE SULFATE PO Take 500 mg by mouth daily.     . insulin NPH Human (HUMULIN N,NOVOLIN N) 100 UNIT/ML injection Inject 0-10 Units into the skin 3 (three) times daily before meals. Per  sliding scale    . insulin regular (NOVOLIN R,HUMULIN R) 100 units/mL injection Inject 22 Units into the skin 2 (two) times daily before a meal.    . Insulin Regular Human (RELION R IJ) Inject 8-10 Units as directed 3 (three) times daily with meals. Breakfast: 8 units Lunch: 10 units Dinner: 10 units    . Lactobacillus (ACIDOPHILUS) CAPS capsule Take 1 capsule by mouth daily.    Marland Kitchen lisinopril-hydrochlorothiazide (PRINZIDE,ZESTORETIC) 20-12.5 MG per tablet Take 2 tablets by mouth daily.     Marland Kitchen lovastatin (MEVACOR) 20 MG tablet Take 20 mg by mouth at bedtime.    . lovastatin (MEVACOR) 40 MG tablet Take 40 mg by mouth at bedtime.    Marland Kitchen omeprazole (PRILOSEC) 40 MG capsule Take 40 mg by mouth daily.     . vitamin E 100 UNIT capsule Take 100 Units by mouth daily.       No current facility-administered medications for this visit.    Allergies  Allergen Reactions  . Simvastatin Other (See Comments)    Muscle cramps    Social History   Social History  . Marital Status: Married    Spouse Name: N/A  . Number of Children: N/A  . Years of Education: N/A   Occupational History  . Not on file.   Social History Main Topics  . Smoking status: Current Every Day Smoker    Types: Cigarettes  . Smokeless tobacco: Never Used  Comment: 09/26/15 5 cigs daily  . Alcohol Use: No  . Drug Use: No  . Sexual Activity: Not on file   Other Topics Concern  . Not on file   Social History Narrative     Review of Systems: General: negative for chills, fever, night sweats or weight changes.  Cardiovascular: negative for chest pain, dyspnea on exertion, edema, orthopnea, palpitations, paroxysmal nocturnal dyspnea or shortness of breath Dermatological: negative for rash Respiratory: negative for cough or wheezing Urologic: negative for hematuria Abdominal: negative for nausea, vomiting, diarrhea, bright red blood per rectum, melena, or hematemesis Neurologic: negative for visual changes, syncope, or  dizziness All other systems reviewed and are otherwise negative except as noted above.    Blood pressure 140/80, pulse 98, height  (1.626 m), weight 162 lb 11.2 oz (73.8 kg).  General appearance: alert and no distress Neck: no adenopathy, no carotid bruit, no JVD, supple, symmetrical, trachea midline and thyroid not enlarged, symmetric, no tenderness/mass/nodules Lungs: clear to auscultation bilaterally Heart: regular rate and rhythm, S1, S2 normal, no murmur, click, rub or gallop Extremities: extremities normal, atraumatic, no cyanosis or edema  EKG not performed today  ASSESSMENT AND PLAN:   Cerebrovascular accident (CVA) due to embolism of left middle cerebral artery [facility]) [name] returns for follow-up of her outpatient test. Carotid Doppler showed mild to moderate right ICA stenosis. 2. Echo showed a calcified mitral annulus with a questionable calcified mobile mass. I discussed this with Dr. Royann Shivers who has agreed to perform outpatient TEE.  Accelerated hypertension History of hypertension blood pressure measured today at 140/80. She is on amlodipine, lisinopril and hydrochlorothiazide. Continue current meds at current dosing  Tobacco abuse History of tobacco abuse trying to cut back smoking now only one cigarette a day  Hyperlipidemia History of hyperlipidemia on lovastatin followed by her PCP      Runell Gess MD [facility], [facility] 10/05/2015 9:23 AM

## 2015-10-05 NOTE — Assessment & Plan Note (Signed)
History of hyperlipidemia on lovastatin followed by her PCP 

## 2015-10-05 NOTE — Assessment & Plan Note (Signed)
History of hypertension blood pressure measured today at 140/80. She is on amlodipine, lisinopril and hydrochlorothiazide. Continue current meds at current dosing

## 2015-10-05 NOTE — Assessment & Plan Note (Signed)
Mrs. Hovland returns for follow-up of her outpatient test. Carotid Doppler showed mild to moderate right ICA stenosis. 2. Echo showed a calcified mitral annulus with a questionable calcified mobile mass. I discussed this with Dr. Royann Shivers who has agreed to perform outpatient TEE.

## 2015-10-11 ENCOUNTER — Other Ambulatory Visit: Payer: Self-pay | Admitting: *Deleted

## 2015-10-11 DIAGNOSIS — I059 Rheumatic mitral valve disease, unspecified: Secondary | ICD-10-CM

## 2015-10-11 DIAGNOSIS — I3489 Other nonrheumatic mitral valve disorders: Secondary | ICD-10-CM

## 2015-10-11 DIAGNOSIS — I348 Other nonrheumatic mitral valve disorders: Secondary | ICD-10-CM

## 2015-10-11 DIAGNOSIS — I3481 Nonrheumatic mitral (valve) annulus calcification: Secondary | ICD-10-CM

## 2015-11-03 LAB — PROTIME-INR
INR: 1.13 (ref <1.50)
Prothrombin Time: 14.6 seconds (ref 11.6–15.2)

## 2015-11-03 LAB — APTT: aPTT: 29 seconds (ref 24–37)

## 2015-11-04 LAB — CBC WITH DIFFERENTIAL/PLATELET
Basophils Absolute: 0 10*3/uL (ref 0.0–0.1)
Basophils Relative: 0 % (ref 0–1)
Eosinophils Absolute: 0.1 10*3/uL (ref 0.0–0.7)
Eosinophils Relative: 1 % (ref 0–5)
HCT: 34.3 % — ABNORMAL LOW (ref 36.0–46.0)
Hemoglobin: 11 g/dL — ABNORMAL LOW (ref 12.0–15.0)
Lymphocytes Relative: 27 % (ref 12–46)
Lymphs Abs: 2.2 10*3/uL (ref 0.7–4.0)
MCH: 28.9 pg (ref 26.0–34.0)
MCHC: 32.1 g/dL (ref 30.0–36.0)
MCV: 90 fL (ref 78.0–100.0)
MPV: 10.2 fL (ref 8.6–12.4)
Monocytes Absolute: 0.5 10*3/uL (ref 0.1–1.0)
Monocytes Relative: 6 % (ref 3–12)
Neutro Abs: 5.5 10*3/uL (ref 1.7–7.7)
Neutrophils Relative %: 66 % (ref 43–77)
Platelets: 320 10*3/uL (ref 150–400)
RBC: 3.81 MIL/uL — ABNORMAL LOW (ref 3.87–5.11)
RDW: 14.2 % (ref 11.5–15.5)
WBC: 8.3 10*3/uL (ref 4.0–10.5)

## 2015-11-04 LAB — BASIC METABOLIC PANEL
BUN: 27 mg/dL — ABNORMAL HIGH (ref 7–25)
CO2: 32 mmol/L — ABNORMAL HIGH (ref 20–31)
Calcium: 9.3 mg/dL (ref 8.6–10.4)
Chloride: 100 mmol/L (ref 98–110)
Creat: 0.9 mg/dL (ref 0.50–0.99)
Glucose, Bld: 179 mg/dL — ABNORMAL HIGH (ref 65–99)
Potassium: 4.5 mmol/L (ref 3.5–5.3)
Sodium: 140 mmol/L (ref 135–146)

## 2015-11-10 ENCOUNTER — Ambulatory Visit (HOSPITAL_BASED_OUTPATIENT_CLINIC_OR_DEPARTMENT_OTHER): Payer: BLUE CROSS/BLUE SHIELD

## 2015-11-10 ENCOUNTER — Encounter (HOSPITAL_COMMUNITY): Payer: Self-pay

## 2015-11-10 ENCOUNTER — Ambulatory Visit (HOSPITAL_COMMUNITY)
Admission: RE | Admit: 2015-11-10 | Discharge: 2015-11-10 | Disposition: A | Discharge status: Home / Self Care | Payer: BLUE CROSS/BLUE SHIELD | Source: Ambulatory Visit | Attending: Cardiovascular Disease | Admitting: Cardiovascular Disease

## 2015-11-10 ENCOUNTER — Encounter (HOSPITAL_COMMUNITY)
Admission: RE | Disposition: A | Discharge status: Home / Self Care | Payer: Self-pay | Source: Ambulatory Visit | Attending: Cardiovascular Disease

## 2015-11-10 DIAGNOSIS — E119 Type 2 diabetes mellitus without complications: Secondary | ICD-10-CM | POA: Diagnosis not present

## 2015-11-10 DIAGNOSIS — Z794 Long term (current) use of insulin: Secondary | ICD-10-CM | POA: Diagnosis not present

## 2015-11-10 DIAGNOSIS — G459 Transient cerebral ischemic attack, unspecified: Secondary | ICD-10-CM

## 2015-11-10 DIAGNOSIS — Z7982 Long term (current) use of aspirin: Secondary | ICD-10-CM | POA: Insufficient documentation

## 2015-11-10 DIAGNOSIS — I638 Other cerebral infarction: Secondary | ICD-10-CM | POA: Diagnosis not present

## 2015-11-10 DIAGNOSIS — Z8673 Personal history of transient ischemic attack (TIA), and cerebral infarction without residual deficits: Secondary | ICD-10-CM | POA: Diagnosis not present

## 2015-11-10 DIAGNOSIS — I059 Rheumatic mitral valve disease, unspecified: Secondary | ICD-10-CM

## 2015-11-10 DIAGNOSIS — I3481 Nonrheumatic mitral (valve) annulus calcification: Secondary | ICD-10-CM

## 2015-11-10 HISTORY — PX: TEE WITHOUT CARDIOVERSION: SHX5443

## 2015-11-10 SURGERY — ECHOCARDIOGRAM, TRANSESOPHAGEAL
Anesthesia: Moderate Sedation

## 2015-11-10 MED ORDER — FENTANYL CITRATE (PF) 100 MCG/2ML IJ SOLN
INTRAMUSCULAR | Status: AC
  Filled 2015-11-10: qty 2

## 2015-11-10 MED ORDER — MIDAZOLAM HCL 5 MG/ML IJ SOLN
INTRAMUSCULAR | Status: AC
Start: 2015-11-10 — End: 2015-11-10
  Filled 2015-11-10: qty 2

## 2015-11-10 MED ORDER — BUTAMBEN-TETRACAINE-BENZOCAINE 2-2-14 % EX AERO
INHALATION_SPRAY | CUTANEOUS | Status: DC | PRN
  Administered 2015-11-10: 2 via TOPICAL

## 2015-11-10 MED ORDER — SODIUM CHLORIDE 0.9 % IV SOLN
INTRAVENOUS | Status: DC
  Administered 2015-11-10: 500 mL via INTRAVENOUS

## 2015-11-10 MED ORDER — FENTANYL CITRATE (PF) 100 MCG/2ML IJ SOLN
INTRAMUSCULAR | Status: DC | PRN
  Administered 2015-11-10: 50 ug via INTRAVENOUS
  Administered 2015-11-10: 25 ug via INTRAVENOUS

## 2015-11-10 MED ORDER — MIDAZOLAM HCL 10 MG/2ML IJ SOLN
INTRAMUSCULAR | Status: DC | PRN
  Administered 2015-11-10 (×2): 1 mg via INTRAVENOUS
  Administered 2015-11-10 (×2): 2 mg via INTRAVENOUS
  Administered 2015-11-10: 1 mg via INTRAVENOUS

## 2015-11-10 NOTE — Discharge Instructions (Signed)

## 2015-11-10 NOTE — H&P (Signed)
Chief Complaint:  Cryptogenic stroke  Cardiologist: Nanetta Batty, M.D.  HPI:  This is a 70 y.o. female with a past medical history significant for unexplained left parietal stroke, moderate right internal carotid artery stenosis, hypertension, active smoking, diabetes, hyperlipidemia, history of Crohn's disease complicated by small bowel obstruction and need for ileostomy subsequently reversed. She is here to undergo transesophageal echocardiogram in an effort to understand the cause of her stroke. Transthoracic echo showed a calcified mitral annulus with a questionable mobile calcified mass.  PMHx:  Past Medical History  Diagnosis Date  . Diabetes mellitus     age 53's  . Hypertension   . GERD (gastroesophageal reflux disease)   . Osteopenia   . Crohn's colitis (HCC)   . Hypercholesterolemia   . Anemia   . Osteopenia   . TIA (transient ischemic attack)   . SBO (small bowel obstruction) (HCC) 09/13/15    Past Surgical History  Procedure Laterality Date  . Colon surgery  07/08/2007  . Ileostomy  02/26/2008    reversal  . Hernia repair  11/21/2009    FAMHx:  Family History  Problem Relation Age of Onset  . Stroke Mother   . Heart attack Father   . Diabetes Brother   . Hypertension Daughter     SOCHx:   reports that she has been smoking Cigarettes.  She has never used smokeless tobacco. She reports that she does not drink alcohol or use illicit drugs.  ALLERGIES:  Allergies  Allergen Reactions  . Simvastatin Other (See Comments)    Muscle cramps    ROS: Pertinent items are noted in HPI.  HOME MEDS: Medications Prior to Admission  Medication Sig Dispense Refill  . amLODipine (NORVASC) 5 MG tablet Take 5 mg by mouth daily.    Marland Kitchen aspirin 81 MG tablet Take 81 mg by mouth daily.    . Biotin 5000 MCG TABS Take 5,000 mcg by mouth daily.     Marland Kitchen buPROPion (WELLBUTRIN XL) 300 MG 24 hr tablet Take 300 mg by mouth daily.     Marland Kitchen CALCIUM PO Take 1 tablet by mouth daily.       . Cholecalciferol (VITAMIN D PO) Take 800 Units by mouth daily.     Marland Kitchen CINNAMON PO Take 500 mg by mouth daily.    . fish oil-omega-3 fatty acids 1000 MG capsule Take 2 g by mouth daily.      Marland Kitchen GLUCOSAMINE SULFATE PO Take 500 mg by mouth daily.     . insulin regular (NOVOLIN R,HUMULIN R) 100 units/mL injection Inject 22 Units into the skin 2 (two) times daily before a meal.    . Insulin Regular Human (RELION R IJ) Inject 8-10 Units as directed 3 (three) times daily with meals. Breakfast: 8 units Lunch: 10 units Dinner: 10 units    . Lactobacillus (ACIDOPHILUS) CAPS capsule Take 1 capsule by mouth daily.    Marland Kitchen lisinopril-hydrochlorothiazide (PRINZIDE,ZESTORETIC) 20-12.5 MG per tablet Take 2 tablets by mouth daily.     Marland Kitchen lovastatin (MEVACOR) 40 MG tablet Take 40 mg by mouth at bedtime.    Marland Kitchen omeprazole (PRILOSEC) 40 MG capsule Take 40 mg by mouth daily.     . vitamin E 100 UNIT capsule Take 100 Units by mouth daily.      . insulin NPH Human (HUMULIN N,NOVOLIN N) 100 UNIT/ML injection Inject 0-10 Units into the skin 3 (three) times daily before meals. Per sliding scale    . lovastatin (MEVACOR) 20 MG tablet Take  20 mg by mouth at bedtime.      LABS/IMAGING: No results found for this or any previous visit (from the past 48 hour(s)). No results found.  VITALS: Blood pressure 188/90, pulse 96, temperature 97.7 F (36.5 C), temperature source Oral, resp. rate 12, SpO2 99 %.  EXAM:  General: Alert, oriented x3, no distress Head: no evidence of trauma, PERRL, EOMI, no exophtalmos or lid lag, no myxedema, no xanthelasma; normal ears, nose and oropharynx Neck: Normal jugular venous pulsations and no hepatojugular reflux; brisk carotid pulses without delay and no carotid bruits Chest: clear to auscultation, no signs of consolidation by percussion or palpation, normal fremitus, symmetrical and full respiratory excursions Cardiovascular: normal position and quality of the apical impulse, regular  rhythm, normal first heart sound and normal second heart sounds, no rubs or gallops, no murmur Abdomen: no tenderness or distention, no masses by palpation, no abnormal pulsatility or arterial bruits, normal bowel sounds, no hepatosplenomegaly Extremities: no clubbing, cyanosis or edema; 2+ radial, ulnar and brachial pulses bilaterally; 2+ right femoral, posterior tibial and dorsalis pedis pulses; 2+ left femoral, posterior tibial and dorsalis pedis pulses; no subclavian or femoral bruits Neurological: grossly nonfocal   IMPRESSION: Unexplained stroke and questionable mitral annulus mobile mass. Plan for transesophageal echo today. This procedure has been fully reviewed with the patient and written informed consent has been obtained.   Thurmon Fair, MD, [facility] [phone] office [phone] pager  11/10/2015, 9:05 AM

## 2015-11-10 NOTE — Progress Notes (Signed)
  Echocardiogram Echocardiogram Transesophageal has been performed.  Leta Jungling M 11/10/2015, 9:51 AM

## 2015-11-10 NOTE — Op Note (Signed)
INDICATIONS: cryptogenic stroke  PROCEDURE:   Informed consent was obtained prior to the procedure. The risks, benefits and alternatives for the procedure were discussed and the patient comprehended these risks.  Risks include, but are not limited to, cough, sore throat, vomiting, nausea, somnolence, esophageal and stomach trauma or perforation, bleeding, low blood pressure, aspiration, pneumonia, infection, trauma to the teeth and death.    After a procedural time-out, the oropharynx was anesthetized with 20% benzocaine spray. The patient was given 7 mg versed and 75 mcg fentanyl for moderate sedation.   The transesophageal probe was inserted in the esophagus and stomach without difficulty and multiple views were obtained.  The patient was kept under observation until the patient left the procedure room.  The patient left the procedure room in stable condition.   Agitated microbubble saline contrast was administered.  COMPLICATIONS:    There were no immediate complications.  FINDINGS:  Mitral annulus calcification without mobile components. Left atrial dilatation. Normal LF systolic function, +ve LVH. No intracardiac masses or thrombi.  RECOMMENDATIONS:   Consider arrhythmia monitor/implantable loop recorder.  Time Spent Directly with the Patient:  30 minutes   Rafael Salway 11/10/2015, 9:30 AM

## 2015-12-05 DIAGNOSIS — N183 Chronic kidney disease, stage 3 (moderate): Secondary | ICD-10-CM | POA: Diagnosis not present

## 2015-12-05 DIAGNOSIS — I129 Hypertensive chronic kidney disease with stage 1 through stage 4 chronic kidney disease, or unspecified chronic kidney disease: Secondary | ICD-10-CM | POA: Diagnosis not present

## 2015-12-05 DIAGNOSIS — E785 Hyperlipidemia, unspecified: Secondary | ICD-10-CM | POA: Diagnosis not present

## 2015-12-05 DIAGNOSIS — E1065 Type 1 diabetes mellitus with hyperglycemia: Secondary | ICD-10-CM | POA: Diagnosis not present

## 2015-12-09 ENCOUNTER — Encounter: Payer: Self-pay | Admitting: Cardiovascular Disease

## 2015-12-09 ENCOUNTER — Ambulatory Visit (INDEPENDENT_AMBULATORY_CARE_PROVIDER_SITE_OTHER): Payer: BLUE CROSS/BLUE SHIELD | Admitting: Cardiovascular Disease

## 2015-12-09 VITALS — BP 160/78 | HR 96 | Ht 64.0 in | Wt 168.0 lb

## 2015-12-09 DIAGNOSIS — I10 Essential (primary) hypertension: Secondary | ICD-10-CM

## 2015-12-09 DIAGNOSIS — E785 Hyperlipidemia, unspecified: Secondary | ICD-10-CM

## 2015-12-09 DIAGNOSIS — I63412 Cerebral infarction due to embolism of left middle cerebral artery: Secondary | ICD-10-CM | POA: Diagnosis not present

## 2015-12-09 DIAGNOSIS — G459 Transient cerebral ischemic attack, unspecified: Secondary | ICD-10-CM | POA: Diagnosis not present

## 2015-12-09 NOTE — Assessment & Plan Note (Signed)
History of hyperlipidemia on statin therapy followed by her PCP. 

## 2015-12-09 NOTE — Patient Instructions (Signed)
Medication Instructions:  Your physician recommends that you continue on your current medications as directed. Please refer to the Current Medication list given to you today.   Labwork: I will get your lab work from your Primary Care Physician.   Testing/Procedures: Your physician has recommended that you wear an event monitor. Event monitors are medical devices that record the heart's electrical activity. Doctors most often [place] these monitors to diagnose arrhythmias. Arrhythmias are problems with the speed or rhythm of the heartbeat. The monitor is a small, portable device. You can wear one while you do your normal daily activities. This is usually used to diagnose what is causing palpitations/syncope (passing out).    Follow-Up: Your physician wants you to follow-up in: 12 months with Dr. Allyson Sabal. You will receive a reminder letter in the mail two months in advance. If you don't receive a letter, please call our office to schedule the follow-up appointment.    Any Other Special Instructions Will Be Listed Below (If Applicable).  Cardiac Event Monitoring A cardiac event monitor is a small recording device used to help detect abnormal heart rhythms (arrhythmias). The monitor is used to record heart rhythm when noticeable symptoms such as the following occur:  Fast heartbeats (palpitations), such as heart racing or fluttering.  Dizziness.  Fainting or light-headedness.  Unexplained weakness. The monitor is wired to two electrodes placed on your chest. Electrodes are flat, sticky disks that attach to your skin. The monitor can be worn for up to 30 days. You will wear the monitor at all times, except when bathing.  HOW TO USE YOUR CARDIAC EVENT MONITOR A technician will prepare your chest for the electrode placement. The technician will show you how to place the electrodes, how to work the monitor, and how to replace the batteries. Take time to practice using the monitor before you leave  the office. Make sure you understand how to send the information from the monitor to your health care provider. This requires a telephone with a landline, not a cell phone. You need to:  Wear your monitor at all times, except when you are in water:  Do not get the monitor wet.  Take the monitor off when bathing. Do not swim or use a hot tub with it on.  Keep your skin clean. Do not put body lotion or moisturizer on your chest.  Change the electrodes daily or any time they stop sticking to your skin. You might need to use tape to keep them on.  It is possible that your skin under the electrodes could become irritated. To keep this from happening, try to put the electrodes in slightly different places on your chest. However, they must remain in the area under your left breast and in the upper right section of your chest.  Make sure the monitor is safely clipped to your clothing or in a location close to your body that your health care provider recommends.  Press the button to record when you feel symptoms of heart trouble, such as dizziness, weakness, light-headedness, palpitations, thumping, shortness of breath, unexplained weakness, or a fluttering or racing heart. The monitor is always on and records what happened slightly before you pressed the button, so do not worry about being too late to get good information.  Keep a diary of your activities, such as walking, doing chores, and taking medicine. It is especially important to note what you were doing when you pushed the button to record your symptoms. This will help your  health care provider determine what might be contributing to your symptoms. The information stored in your monitor will be reviewed by your health care provider alongside your diary entries.  Send the recorded information as recommended by your health care provider. It is important to understand that it will take some time for your health care provider to process the  results.  Change the batteries as recommended by your health care provider. SEEK IMMEDIATE MEDICAL CARE IF:   You have chest pain.  You have extreme difficulty breathing or shortness of breath.  You develop a very fast heartbeat that persists.  You develop dizziness that does not go away.  You faint or constantly feel you are about to faint.   This information is not intended to replace advice given to you by your health care provider. Make sure you discuss any questions you have with your health care provider.   Document Released: 05/29/2008 Document Revised: 09/10/2014 Document Reviewed: 02/16/2013 Elsevier Interactive Patient Education [employer]    If you need a refill on your cardiac medications before your next appointment, please call your pharmacy.

## 2015-12-09 NOTE — Assessment & Plan Note (Signed)
History of hypertension blood pressure measured at 160/78. She says ordinarily her blood pressure is much better controlled at home and she measures it several times a day. She is on amlodipine, lisinopril and hydrochlorothiazide. Continue current meds at current dosing

## 2015-12-09 NOTE — Assessment & Plan Note (Signed)
History of left-sided parietal infarcts in the setting of a TIA that occurred in [place] last year. Carotid Doppler showed mild to moderate right ICA stenosis and a TEE did not show a mobile mass on the mitral valve nor did show thrombus. Based on this, I'm going to obtain a 30 day event monitor rule out occult PAF as a cause.

## 2015-12-09 NOTE — Progress Notes (Signed)
12/09/2015 [name]   [birth year]  [id number]  Primary Physician Joanne Headings, MD Primary Cardiologist: Joanne Gess MD [name]   HPI:  [name] is a 70 year old moderately overweight widowed Caucasian female mother of 4 living children, grandmother to 2 grandchildren referred by [name] for cardiovascular evaluation. I last saw her in the office 10/05/15.  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 Her cardiovascular status profile follows notable for 50-pack-years tobacco, treated hypertension, hypokalemia, diabetes and family history the father who died of a myocardial infarction at age 22. She has never had a heart attack but has had a TIA on 07/31/59 while in [place] visiting her family. This was characterized by right upper extremity paralysis which ultimately resolved. A CT scan showed 4 small left parietal lobe subacute infarcts. Carotid Doppler showed moderate right ICA stenosis. A 2-D echocardiogram showed a calcified mitral was with a questionable mobile calcified mass. Her primary care physician has further adjusted her antihypertensive medications recently.I had Dr. Royann Oconnell perform a transesophageal echo 11/10/15 that did not demonstrate a mobile mass on her mitral valve nor was there evidence of thrombus.   Current Outpatient Prescriptions  Medication Sig Dispense Refill  . amLODipine (NORVASC) 5 MG tablet Take 5 mg by mouth daily.    Marland Kitchen aspirin 81 MG tablet Take 81 mg by mouth daily.    . Biotin 5000 MCG TABS Take 5,000 mcg by mouth daily.     Marland Kitchen buPROPion (WELLBUTRIN XL) 300 MG 24 hr tablet Take 300 mg by mouth daily.     Marland Kitchen CALCIUM PO Take 1 tablet by mouth daily.      . Cholecalciferol (VITAMIN D PO) Take 800 Units by mouth daily.     Marland Kitchen CINNAMON PO Take 500 mg by mouth daily.    . fish oil-omega-3 fatty acids 1000 MG capsule Take 2 g by mouth daily.      Marland Kitchen GLUCOSAMINE SULFATE PO Take 500 mg by mouth  daily.     . insulin NPH Human (HUMULIN N,NOVOLIN N) 100 UNIT/ML injection Inject 0-10 Units into the skin 3 (three) times daily before meals. Per sliding scale    . insulin regular (NOVOLIN R,HUMULIN R) 100 units/mL injection Inject 22 Units into the skin 2 (two) times daily before a meal.    . Insulin Regular Human (RELION R IJ) Inject 8-10 Units as directed 3 (three) times daily with meals. Breakfast: 8 units Lunch: 10 units Dinner: 10 units    . Lactobacillus (ACIDOPHILUS) CAPS capsule Take 1 capsule by mouth daily.    Marland Kitchen lisinopril-hydrochlorothiazide (PRINZIDE,ZESTORETIC) 20-12.5 MG per tablet Take 2 tablets by mouth daily.     Marland Kitchen lovastatin (MEVACOR) 40 MG tablet Take 40 mg by mouth at bedtime.    Marland Kitchen omeprazole (PRILOSEC) 40 MG capsule Take 40 mg by mouth daily.     . vitamin E 100 UNIT capsule Take 100 Units by mouth daily.       No current facility-administered medications for this visit.    Allergies  Allergen Reactions  . Simvastatin Other (See Comments)    Muscle cramps    Social History   Social History  . Marital Status: Married    Spouse Name: N/A  . Number of Children: N/A  . Years of Education: N/A   Occupational History  . Not on file.   Social History Main Topics  . Smoking status: Current Every Day Smoker  Types: Cigarettes  . Smokeless tobacco: Never Used     Comment: 09/26/15 5 cigs daily  . Alcohol Use: No  . Drug Use: No  . Sexual Activity: Not on file   Other Topics Concern  . Not on file   Social History Narrative     Review of Systems: General: negative for chills, fever, night sweats or weight changes.  Cardiovascular: negative for chest pain, dyspnea on exertion, edema, orthopnea, palpitations, paroxysmal nocturnal dyspnea or shortness of breath Dermatological: negative for rash Respiratory: negative for cough or wheezing Urologic: negative for hematuria Abdominal: negative for nausea, vomiting, diarrhea, bright red blood per rectum,  melena, or hematemesis Neurologic: negative for visual changes, syncope, or dizziness All other systems reviewed and are otherwise negative except as noted above.    Blood pressure 160/78, pulse 96, height  (1.626 m), weight 168 lb (76.204 kg).  General appearance: alert and no distress Neck: no adenopathy, no carotid bruit, no JVD, supple, symmetrical, trachea midline and thyroid not enlarged, symmetric, no tenderness/mass/nodules Lungs: clear to auscultation bilaterally Heart: regular rate and rhythm, S1, S2 normal, no murmur, click, rub or gallop Extremities: extremities normal, atraumatic, no cyanosis or edema  EKG normal sinus rhythm at 96 without ST or T-wave changes. I personally reviewed this EKG  ASSESSMENT AND PLAN:   Cerebrovascular accident (CVA) due to embolism of left middle cerebral artery (HCC) History of left-sided parietal infarcts in the setting of a TIA that occurred in [place] last year. Carotid Doppler showed mild to moderate right ICA stenosis and a TEE did not show a mobile mass on the mitral valve nor did show thrombus. Based on this, I'm going to obtain a 30 day event monitor rule out occult PAF as a cause.  Accelerated hypertension History of hypertension blood pressure measured at 160/78. She says ordinarily her blood pressure is much better controlled at home and she measures it several times a day. She is on amlodipine, lisinopril and hydrochlorothiazide. Continue current meds at current dosing  Hyperlipidemia History of hyperlipidemia on statin therapy followed by her PCP      Joanne Gess MD [facility], [facility] 12/09/2015 9:29 AM

## 2015-12-15 ENCOUNTER — Other Ambulatory Visit: Payer: Self-pay | Admitting: Cardiovascular Disease

## 2015-12-15 DIAGNOSIS — I4891 Unspecified atrial fibrillation: Secondary | ICD-10-CM

## 2015-12-15 DIAGNOSIS — G459 Transient cerebral ischemic attack, unspecified: Secondary | ICD-10-CM

## 2015-12-22 ENCOUNTER — Ambulatory Visit (INDEPENDENT_AMBULATORY_CARE_PROVIDER_SITE_OTHER): Payer: BLUE CROSS/BLUE SHIELD

## 2015-12-22 DIAGNOSIS — I4891 Unspecified atrial fibrillation: Secondary | ICD-10-CM

## 2015-12-22 DIAGNOSIS — G459 Transient cerebral ischemic attack, unspecified: Secondary | ICD-10-CM

## 2016-09-07 DIAGNOSIS — E785 Hyperlipidemia, unspecified: Secondary | ICD-10-CM | POA: Diagnosis not present

## 2016-09-07 DIAGNOSIS — E1065 Type 1 diabetes mellitus with hyperglycemia: Secondary | ICD-10-CM | POA: Diagnosis not present

## 2016-09-07 DIAGNOSIS — N183 Chronic kidney disease, stage 3 (moderate): Secondary | ICD-10-CM | POA: Diagnosis not present

## 2016-09-07 DIAGNOSIS — I129 Hypertensive chronic kidney disease with stage 1 through stage 4 chronic kidney disease, or unspecified chronic kidney disease: Secondary | ICD-10-CM | POA: Diagnosis not present

## 2016-10-16 DIAGNOSIS — H2512 Age-related nuclear cataract, left eye: Secondary | ICD-10-CM | POA: Diagnosis not present

## 2016-10-16 DIAGNOSIS — H2513 Age-related nuclear cataract, bilateral: Secondary | ICD-10-CM | POA: Diagnosis not present

## 2016-10-16 DIAGNOSIS — H2589 Other age-related cataract: Secondary | ICD-10-CM | POA: Diagnosis not present

## 2016-10-16 DIAGNOSIS — H18413 Arcus senilis, bilateral: Secondary | ICD-10-CM | POA: Diagnosis not present

## 2016-10-16 DIAGNOSIS — H02839 Dermatochalasis of unspecified eye, unspecified eyelid: Secondary | ICD-10-CM | POA: Diagnosis not present

## 2016-10-16 DIAGNOSIS — H25013 Cortical age-related cataract, bilateral: Secondary | ICD-10-CM | POA: Diagnosis not present

## 2016-12-21 ENCOUNTER — Ambulatory Visit (INDEPENDENT_AMBULATORY_CARE_PROVIDER_SITE_OTHER): Payer: BLUE CROSS/BLUE SHIELD | Admitting: Cardiovascular Disease

## 2016-12-21 ENCOUNTER — Encounter: Payer: Self-pay | Admitting: Cardiovascular Disease

## 2016-12-21 VITALS — BP 150/74 | HR 96 | Ht 64.0 in | Wt 171.8 lb

## 2016-12-21 DIAGNOSIS — E7800 Pure hypercholesterolemia, unspecified: Secondary | ICD-10-CM

## 2016-12-21 DIAGNOSIS — Z720 Tobacco use: Secondary | ICD-10-CM | POA: Diagnosis not present

## 2016-12-21 DIAGNOSIS — I6521 Occlusion and stenosis of right carotid artery: Secondary | ICD-10-CM | POA: Diagnosis not present

## 2016-12-21 DIAGNOSIS — I10 Essential (primary) hypertension: Secondary | ICD-10-CM

## 2016-12-21 NOTE — Assessment & Plan Note (Signed)
Discontinued tobacco abuse in January of this year

## 2016-12-21 NOTE — Patient Instructions (Signed)
Medication Instructions: Your physician recommends that you continue on your current medications as directed. Please refer to the Current Medication list given to you today.  Testing/Procedures: Your physician has requested that you have a carotid duplex. This test is an ultrasound of the carotid arteries in your neck. It looks at blood flow through these arteries that supply the brain with blood. Allow one hour for this exam. There are no restrictions or special instructions.  Follow-Up: Your physician wants you to follow-up in: 1 year with Dr. Berry. You will receive a reminder letter in the mail two months in advance. If you don't receive a letter, please call our office to schedule the follow-up appointment.  If you need a refill on your cardiac medications before your next appointment, please call your pharmacy.  

## 2016-12-21 NOTE — Progress Notes (Signed)
12/21/2016 Woodward Ku Brubeck   Dec 12, 1945  [id number]  Primary Physician Thayer Headings, MD Primary Cardiologist: Runell Gess MD [name]  HPI:  [name] is a 71 year old moderately overweight widowed Caucasian female mother of 4 living children, grandmother to 47 grandchildren referred by Dr. Ronne Binning for cardiovascular evaluation. I last saw her in the office 4 /7/17.  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 Her cardiovascular status profile follows notable for 50-pack-years tobacco, treated hypertension, hypokalemia, diabetes and family history the father who died of a myocardial infarction at age 31. She has never had a heart attack but has had a TIA on 07/31/59 while in [place] visiting her family. This was characterized by right upper extremity paralysis which ultimately resolved. A CT scan showed 4 small left parietal lobe subacute infarcts. Carotid Doppler showed moderate right ICA stenosis. A 2-D echocardiogram showed a calcified mitral was with a questionable mobile calcified mass. Her primary care physician has further adjusted her antihypertensive medications recently.I had Dr. Royann Shivers perform a transesophageal echo 11/10/15 that did not demonstrate a mobile mass on her mitral valve nor was there evidence of thrombus. Since I saw her in the office a year ago she's remained clinically stable. She denies chest pain or shortness of breath. She has stopped smoking in January of this year.   Current Outpatient Prescriptions  Medication Sig Dispense Refill  . amLODipine (NORVASC) 5 MG tablet Take 5 mg by mouth daily.    Marland Kitchen aspirin 81 MG tablet Take 81 mg by mouth daily.    . Biotin 5000 MCG TABS Take 5,000 mcg by mouth daily.     Marland Kitchen buPROPion (WELLBUTRIN XL) 300 MG 24 hr tablet Take 300 mg by mouth daily.     Marland Kitchen CALCIUM PO Take 1 tablet by mouth daily.      . Cholecalciferol (VITAMIN D PO) Take 800 Units by mouth daily.     Marland Kitchen  CINNAMON PO Take 500 mg by mouth daily.    . fish oil-omega-3 fatty acids 1000 MG capsule Take 2 g by mouth daily.      . furosemide (LASIX) 20 MG tablet Take 20 mg by mouth as needed.    Marland Kitchen GLUCOSAMINE SULFATE PO Take 500 mg by mouth daily.     . insulin regular (NOVOLIN R,HUMULIN R) 100 units/mL injection Inject 22 Units into the skin 2 (two) times daily before a meal.    . Insulin Regular Human (RELION R IJ) Inject 8-10 Units as directed 3 (three) times daily with meals. Breakfast: 8 units Lunch: 10 units Dinner: 10 units    . levothyroxine (SYNTHROID, LEVOTHROID) 25 MCG tablet Take 25 mcg by mouth daily before breakfast.    . lovastatin (MEVACOR) 40 MG tablet Take 40 mg by mouth at bedtime.    Marland Kitchen omeprazole (PRILOSEC) 40 MG capsule Take 40 mg by mouth daily.     . vitamin E 100 UNIT capsule Take 100 Units by mouth daily.       No current facility-administered medications for this visit.     Allergies  Allergen Reactions  . Simvastatin Other (See Comments)    Muscle cramps    Social History   Social History  . Marital status: Married    Spouse name: N/A  . Number of children: N/A  . Years of education: N/A   Occupational History  . Not on file.   Social History Main Topics  .  Smoking status: Current Every Day Smoker    Types: Cigarettes  . Smokeless tobacco: Never Used     Comment: 09/26/15 5 cigs daily  . Alcohol use No  . Drug use: No  . Sexual activity: Not on file   Other Topics Concern  . Not on file   Social History Narrative  . No narrative on file     Review of Systems: General: negative for chills, fever, night sweats or weight changes.  Cardiovascular: negative for chest pain, dyspnea on exertion, edema, orthopnea, palpitations, paroxysmal nocturnal dyspnea or shortness of breath Dermatological: negative for rash Respiratory: negative for cough or wheezing Urologic: negative for hematuria Abdominal: negative for nausea, vomiting, diarrhea, bright red  blood per rectum, melena, or hematemesis Neurologic: negative for visual changes, syncope, or dizziness All other systems reviewed and are otherwise negative except as noted above.    Blood pressure (!) 150/74, pulse 96, height 5\' 4"  (1.626 m), weight 171 lb 12.8 oz (77.9 kg).  General appearance: alert and no distress Neck: no adenopathy, no carotid bruit, no JVD, supple, symmetrical, trachea midline and thyroid not enlarged, symmetric, no tenderness/mass/nodules Lungs: clear to auscultation bilaterally Heart: regular rate and rhythm, S1, S2 normal, no murmur, click, rub or gallop Extremities: extremities normal, atraumatic, no cyanosis or edema  EKG normal sinus rhythm at 96 with the interim development of  a left bundle branch block new since the prior EKG. I personally reviewed this EKG.  ASSESSMENT AND PLAN:   Accelerated hypertension History of hypertension with blood pressure measured at 150/74. She is on amlodipine. Continue current meds at current dosing  Tobacco abuse Discontinued tobacco abuse in January of this year  Hyperlipidemia History of hyperlipidemia on statin therapy followed by her PCP      Runell Gess MD [facility], [facility] 12/21/2016 9:47 AM

## 2016-12-21 NOTE — Assessment & Plan Note (Signed)
History of hyperlipidemia on statin therapy followed by her PCP. 

## 2016-12-21 NOTE — Assessment & Plan Note (Signed)
History of hypertension with blood pressure measured at 150/74. She is on amlodipine. Continue current meds at current dosing

## 2017-01-15 ENCOUNTER — Ambulatory Visit (HOSPITAL_COMMUNITY)
Admission: RE | Admit: 2017-01-15 | Discharge: 2017-01-15 | Disposition: A | Discharge status: Home / Self Care | Payer: BLUE CROSS/BLUE SHIELD | Source: Ambulatory Visit | Attending: Cardiology | Admitting: Cardiology

## 2017-01-15 ENCOUNTER — Other Ambulatory Visit: Payer: Self-pay | Admitting: Cardiovascular Disease

## 2017-01-15 DIAGNOSIS — E119 Type 2 diabetes mellitus without complications: Secondary | ICD-10-CM | POA: Insufficient documentation

## 2017-01-15 DIAGNOSIS — Z8673 Personal history of transient ischemic attack (TIA), and cerebral infarction without residual deficits: Secondary | ICD-10-CM | POA: Insufficient documentation

## 2017-01-15 DIAGNOSIS — E785 Hyperlipidemia, unspecified: Secondary | ICD-10-CM | POA: Insufficient documentation

## 2017-01-15 DIAGNOSIS — I6523 Occlusion and stenosis of bilateral carotid arteries: Secondary | ICD-10-CM | POA: Diagnosis not present

## 2017-01-15 DIAGNOSIS — I6521 Occlusion and stenosis of right carotid artery: Secondary | ICD-10-CM

## 2017-01-15 DIAGNOSIS — Z87891 Personal history of nicotine dependence: Secondary | ICD-10-CM | POA: Diagnosis not present

## 2017-01-15 DIAGNOSIS — I10 Essential (primary) hypertension: Secondary | ICD-10-CM | POA: Diagnosis not present

## 2017-01-21 DIAGNOSIS — M810 Age-related osteoporosis without current pathological fracture: Secondary | ICD-10-CM | POA: Diagnosis not present

## 2017-03-18 DIAGNOSIS — H2512 Age-related nuclear cataract, left eye: Secondary | ICD-10-CM | POA: Diagnosis not present

## 2017-03-19 DIAGNOSIS — H2511 Age-related nuclear cataract, right eye: Secondary | ICD-10-CM | POA: Diagnosis not present

## 2017-04-08 DIAGNOSIS — H2511 Age-related nuclear cataract, right eye: Secondary | ICD-10-CM | POA: Diagnosis not present

## 2017-07-23 DIAGNOSIS — J111 Influenza due to unidentified influenza virus with other respiratory manifestations: Secondary | ICD-10-CM | POA: Diagnosis not present

## 2017-07-23 DIAGNOSIS — R0602 Shortness of breath: Secondary | ICD-10-CM | POA: Diagnosis not present

## 2017-07-23 DIAGNOSIS — J441 Chronic obstructive pulmonary disease with (acute) exacerbation: Secondary | ICD-10-CM | POA: Diagnosis not present

## 2017-08-21 DIAGNOSIS — J069 Acute upper respiratory infection, unspecified: Secondary | ICD-10-CM | POA: Diagnosis not present

## 2017-10-01 DIAGNOSIS — E039 Hypothyroidism, unspecified: Secondary | ICD-10-CM | POA: Diagnosis not present

## 2017-10-01 DIAGNOSIS — E7800 Pure hypercholesterolemia, unspecified: Secondary | ICD-10-CM | POA: Diagnosis not present

## 2017-10-01 DIAGNOSIS — I10 Essential (primary) hypertension: Secondary | ICD-10-CM | POA: Diagnosis not present

## 2017-10-01 DIAGNOSIS — E1065 Type 1 diabetes mellitus with hyperglycemia: Secondary | ICD-10-CM | POA: Diagnosis not present

## 2017-10-01 DIAGNOSIS — J069 Acute upper respiratory infection, unspecified: Secondary | ICD-10-CM | POA: Diagnosis not present

## 2017-10-01 DIAGNOSIS — N183 Chronic kidney disease, stage 3 (moderate): Secondary | ICD-10-CM | POA: Diagnosis not present

## 2018-01-08 DIAGNOSIS — E039 Hypothyroidism, unspecified: Secondary | ICD-10-CM | POA: Diagnosis not present

## 2018-01-08 DIAGNOSIS — E1065 Type 1 diabetes mellitus with hyperglycemia: Secondary | ICD-10-CM | POA: Diagnosis not present

## 2018-01-08 DIAGNOSIS — E7800 Pure hypercholesterolemia, unspecified: Secondary | ICD-10-CM | POA: Diagnosis not present

## 2018-01-08 DIAGNOSIS — E559 Vitamin D deficiency, unspecified: Secondary | ICD-10-CM | POA: Diagnosis not present

## 2018-01-13 DIAGNOSIS — E02 Subclinical iodine-deficiency hypothyroidism: Secondary | ICD-10-CM | POA: Diagnosis not present

## 2018-01-13 DIAGNOSIS — E1065 Type 1 diabetes mellitus with hyperglycemia: Secondary | ICD-10-CM | POA: Diagnosis not present

## 2018-01-13 DIAGNOSIS — E7800 Pure hypercholesterolemia, unspecified: Secondary | ICD-10-CM | POA: Diagnosis not present

## 2018-01-13 DIAGNOSIS — M810 Age-related osteoporosis without current pathological fracture: Secondary | ICD-10-CM | POA: Diagnosis not present

## 2018-01-13 DIAGNOSIS — I10 Essential (primary) hypertension: Secondary | ICD-10-CM | POA: Diagnosis not present

## 2018-01-13 DIAGNOSIS — E559 Vitamin D deficiency, unspecified: Secondary | ICD-10-CM | POA: Diagnosis not present

## 2018-04-15 DIAGNOSIS — E02 Subclinical iodine-deficiency hypothyroidism: Secondary | ICD-10-CM | POA: Diagnosis not present

## 2018-04-15 DIAGNOSIS — E559 Vitamin D deficiency, unspecified: Secondary | ICD-10-CM | POA: Diagnosis not present

## 2018-06-22 ENCOUNTER — Encounter (HOSPITAL_BASED_OUTPATIENT_CLINIC_OR_DEPARTMENT_OTHER): Payer: Self-pay | Admitting: Emergency Medicine

## 2018-06-22 ENCOUNTER — Emergency Department (HOSPITAL_BASED_OUTPATIENT_CLINIC_OR_DEPARTMENT_OTHER)
Admission: EM | Admit: 2018-06-22 | Discharge: 2018-06-22 | Disposition: A | Discharge status: Home / Self Care | Payer: BLUE CROSS/BLUE SHIELD | Attending: Emergency Medicine | Admitting: Emergency Medicine

## 2018-06-22 ENCOUNTER — Emergency Department (HOSPITAL_BASED_OUTPATIENT_CLINIC_OR_DEPARTMENT_OTHER): Payer: BLUE CROSS/BLUE SHIELD

## 2018-06-22 ENCOUNTER — Other Ambulatory Visit: Payer: Self-pay

## 2018-06-22 DIAGNOSIS — E119 Type 2 diabetes mellitus without complications: Secondary | ICD-10-CM | POA: Insufficient documentation

## 2018-06-22 DIAGNOSIS — Z7982 Long term (current) use of aspirin: Secondary | ICD-10-CM | POA: Diagnosis not present

## 2018-06-22 DIAGNOSIS — E1165 Type 2 diabetes mellitus with hyperglycemia: Secondary | ICD-10-CM | POA: Diagnosis not present

## 2018-06-22 DIAGNOSIS — R52 Pain, unspecified: Secondary | ICD-10-CM | POA: Diagnosis not present

## 2018-06-22 DIAGNOSIS — R0902 Hypoxemia: Secondary | ICD-10-CM | POA: Diagnosis not present

## 2018-06-22 DIAGNOSIS — Z794 Long term (current) use of insulin: Secondary | ICD-10-CM | POA: Insufficient documentation

## 2018-06-22 DIAGNOSIS — R609 Edema, unspecified: Secondary | ICD-10-CM | POA: Diagnosis not present

## 2018-06-22 DIAGNOSIS — M25461 Effusion, right knee: Secondary | ICD-10-CM | POA: Diagnosis not present

## 2018-06-22 DIAGNOSIS — F17210 Nicotine dependence, cigarettes, uncomplicated: Secondary | ICD-10-CM | POA: Diagnosis not present

## 2018-06-22 DIAGNOSIS — I10 Essential (primary) hypertension: Secondary | ICD-10-CM | POA: Insufficient documentation

## 2018-06-22 DIAGNOSIS — M109 Gout, unspecified: Secondary | ICD-10-CM | POA: Diagnosis not present

## 2018-06-22 DIAGNOSIS — M25561 Pain in right knee: Secondary | ICD-10-CM | POA: Diagnosis present

## 2018-06-22 DIAGNOSIS — Z79899 Other long term (current) drug therapy: Secondary | ICD-10-CM | POA: Insufficient documentation

## 2018-06-22 LAB — SYNOVIAL CELL COUNT + DIFF, W/ CRYSTALS
Eosinophils-Synovial: 1 % (ref 0–1)
Lymphocytes-Synovial Fld: 2 % (ref 0–20)
Monocyte-Macrophage-Synovial Fluid: 4 % — ABNORMAL LOW (ref 50–90)
Neutrophil, Synovial: 93 % — ABNORMAL HIGH (ref 0–25)
Other Cells-SYN: 0
WBC, Synovial: 6880 /mm3 — ABNORMAL HIGH (ref 0–200)

## 2018-06-22 LAB — GRAM STAIN

## 2018-06-22 LAB — CBG MONITORING, ED: Glucose-Capillary: 228 mg/dL — ABNORMAL HIGH (ref 70–99)

## 2018-06-22 MED ORDER — HYDROCODONE-ACETAMINOPHEN 5-325 MG PO TABS
1.0000 | ORAL_TABLET | Freq: Once | ORAL | Status: AC
  Administered 2018-06-22: 1 via ORAL
  Filled 2018-06-22: qty 1

## 2018-06-22 MED ORDER — LIDOCAINE HCL (PF) 1 % IJ SOLN
30.0000 mL | Freq: Once | INTRAMUSCULAR | Status: DC
  Filled 2018-06-22: qty 30

## 2018-06-22 MED ORDER — HYDROCODONE-ACETAMINOPHEN 5-325 MG PO TABS: 1.0000 | ORAL_TABLET | ORAL | 0 refills | Status: DC | PRN

## 2018-06-22 MED ORDER — COLCHICINE 0.6 MG PO TABS: 0.6000 mg | ORAL_TABLET | ORAL | 0 refills | Status: DC | PRN

## 2018-06-22 MED ORDER — MORPHINE SULFATE (PF) 4 MG/ML IV SOLN
INTRAVENOUS | Status: AC
  Filled 2018-06-22: qty 1

## 2018-06-22 MED ORDER — MORPHINE SULFATE (PF) 4 MG/ML IV SOLN
4.0000 mg | Freq: Once | INTRAVENOUS | Status: AC
  Administered 2018-06-22: 4 mg via INTRAVENOUS

## 2018-06-22 NOTE — ED Provider Notes (Signed)
Patient signed out to me from Dr. Lynelle Doctor.  72 year old female with what sounds like a traumatic knee effusion.  An arthrocentesis was already performed and the plan is to wait for the results of the fluid analysis.  Barring any signs of septic joint she would be discharged to follow-up with her primary care doctor with some pain medicine.   Patient's synovial fluid showing crystals.  I updated the patient on the results and will prescribe her some colchicine.  She is a diabetic with fairly labile sugars so I do not think prednisone would be good for her.  She understands need to follow-up with her doctor for further evaluation.    Terrilee Files, MD 06/22/18 2306

## 2018-06-22 NOTE — Discharge Instructions (Addendum)
You were evaluated in the emergency department for acute pain and swelling of your knee overusing it.  Your doctor did a aspiration of some fluid and it was positive for gout.  We are prescribing you some medication to help with the pain and inflammation in your knee.  It will be important for you to follow-up with your doctor for further evaluation.

## 2018-06-22 NOTE — ED Triage Notes (Signed)
R knee pain and swelling today, unable to walk. She did a lot of cleaning yesterday. Given fentanyl PTA by [facility].

## 2018-06-22 NOTE — ED Provider Notes (Addendum)
[facility] EMERGENCY DEPARTMENT Provider Note   CSN: [id number] Arrival date & time: 06/22/18  1203     History   Chief Complaint Chief Complaint  Patient presents with  . Knee Pain    HPI [name] is a 72 y.o. female.  HPI Patient presents to the emergency room for evaluation of right knee pain.  Patient states she was very active gardening yesterday but does not recall any specific injury.  This morning when she woke up her knee was severely swollen.  It is very painful to the touch.  She was unable to walk so she called EMS.  Patient denies any fevers or chills.  No falls.  No prior history of joint problems.  She takes an aspirin daily but is not on any anticoagulation Past Medical History:  Diagnosis Date  . Anemia   . Crohn's colitis (HCC)   . Diabetes mellitus    age 34's  . GERD (gastroesophageal reflux disease)   . Hypercholesterolemia   . Hypertension   . Osteopenia   . Osteopenia   . SBO (small bowel obstruction) (HCC) 09/13/15  . TIA (transient ischemic attack)     Patient Active Problem List   Diagnosis Date Noted  . Small bowel obstruction (HCC) 09/14/2015  . Bowel obstruction (HCC) 09/13/2015  . Hyperlipidemia 08/22/2015  . 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 Right-sided extracranial carotid artery stenosis 08/11/2015  . Type 2 diabetes mellitus (HCC) 08/11/2015  . Accelerated hypertension 08/11/2015  . Tobacco abuse 08/11/2015    Past Surgical History:  Procedure Laterality Date  . COLON SURGERY  07/08/2007  . HERNIA REPAIR  11/21/2009  . ILEOSTOMY  02/26/2008   reversal  . TEE WITHOUT CARDIOVERSION N/A 11/10/2015   Procedure: TRANSESOPHAGEAL ECHOCARDIOGRAM (TEE);  Surgeon: Thurmon Fair, MD;  Location: [facility] ENDOSCOPY;  Service: Cardiovascular;  Laterality: N/A;     OB History   None      Home Medications    Prior to Admission medications   Medication Sig  Start Date End Date Taking? Authorizing Provider  lisinopril-hydrochlorothiazide (PRINZIDE,ZESTORETIC) 10-12.5 MG tablet Take 1 tablet by mouth daily.   Yes [provider]  amLODipine (NORVASC) 5 MG tablet Take 5 mg by mouth daily.    [provider]  aspirin 81 MG tablet Take 81 mg by mouth daily.    [provider]  Biotin 5000 MCG TABS Take 5,000 mcg by mouth daily.     [provider]  buPROPion (WELLBUTRIN XL) 300 MG 24 hr tablet Take 300 mg by mouth daily.     [provider]  CALCIUM PO Take 1 tablet by mouth daily.      [provider]  Cholecalciferol (VITAMIN D PO) Take 800 Units by mouth daily.     [provider]  CINNAMON PO Take 500 mg by mouth daily.    [provider]  fish oil-omega-3 fatty acids 1000 MG capsule Take 2 g by mouth daily.      [provider]  furosemide (LASIX) 20 MG tablet Take 20 mg by mouth as needed.    [provider]  GLUCOSAMINE SULFATE PO Take 500 mg by mouth daily.     [provider]  HYDROcodone-acetaminophen (NORCO/VICODIN) 5-325 MG tablet Take 1 tablet by mouth every 4 (four) hours as needed. 06/22/18   Linwood Dibbles, MD  insulin regular (NOVOLIN R,HUMULIN R) 100 units/mL injection Inject 22 Units  into the skin 2 (two) times daily before a meal.    [provider]  Insulin Regular Human (RELION R IJ) Inject 8-10 Units as directed 3 (three) times daily with meals. Breakfast: 8 units Lunch: 10 units Dinner: 10 units    [provider]  levothyroxine (SYNTHROID, LEVOTHROID) 25 MCG tablet Take 25 mcg by mouth daily before breakfast.    [provider]  lovastatin (MEVACOR) 40 MG tablet Take 40 mg by mouth at bedtime.    [provider]  omeprazole (PRILOSEC) 40 MG capsule Take 40 mg by mouth daily.  05/05/15   [provider]  vitamin E 100 UNIT capsule Take 100 Units by mouth daily.      [provider]     Family History Family History  Problem Relation Age of Onset  . Stroke Mother   . Heart attack Father   . Hypertension Daughter   . Diabetes Brother     Social History Social History   Tobacco Use  . Smoking status: Current Every Day Smoker    Types: Cigarettes  . Smokeless tobacco: Never Used  . Tobacco comment: 09/26/15 5 cigs daily  Substance Use Topics  . Alcohol use: No    Alcohol/week: 0.0 standard drinks  . Drug use: No     Allergies   Simvastatin   Review of Systems Review of Systems  All other systems reviewed and are negative.    Physical Exam Updated Vital Signs BP (!) 177/74 (BP Location: Right Arm)   Pulse (!) 104   Temp 98.8 F (37.1 C) (Oral)   Resp 16   SpO2 98%   Physical Exam  Constitutional: She appears well-developed and well-nourished. No distress.  HENT:  Head: Normocephalic and atraumatic.  Right Ear: External ear normal.  Left Ear: External ear normal.  Eyes: Conjunctivae are normal. Right eye exhibits no discharge. Left eye exhibits no discharge. No scleral icterus.  Neck: Neck supple. No tracheal deviation present.  Cardiovascular: Normal rate.  Pulmonary/Chest: Effort normal. No stridor. No respiratory distress.  Abdominal: She exhibits no distension.  Musculoskeletal: She exhibits no edema.       Right knee: She exhibits decreased range of motion, swelling and effusion. She exhibits no laceration and no erythema. Tenderness found.  Neurological: She is alert. Cranial nerve deficit: no gross deficits.  Skin: Skin is warm and dry. No rash noted.  Psychiatric: She has a normal mood and affect.  Nursing note and vitals reviewed.    ED Treatments / Results  Labs (all labs ordered are listed, but only abnormal results are displayed) Labs Reviewed  CBG MONITORING, ED - Abnormal; Notable for the following components:      Result Value   Glucose-Capillary 228 (*)    All other components within normal limits  BODY FLUID  CULTURE  GRAM STAIN  SYNOVIAL CELL COUNT + DIFF, W/ CRYSTALS  GLUCOSE, BODY FLUID OTHER     Radiology Dg Knee Complete 4 Views Right  Result Date: 06/22/2018 CLINICAL DATA:  Knee pain and swelling 1 day ago. EXAM: RIGHT KNEE - COMPLETE 4+ VIEW COMPARISON:  None. FINDINGS: Joint effusion without fracture or dislocation. Chondrocalcinosis, fairly extensive to be senile. Mild to moderate degenerative marginal spurring greatest at the patellofemoral compartment IMPRESSION: 1. Joint effusion without acute osseous finding. 2. Chondrocalcinosis and degenerative marginal spurring. Electronically Signed   By: Marnee Spring M.D.   On: 06/22/2018 13:36    Procedures .Joint Aspiration/Arthrocentesis Date/Time: 06/22/2018 2:34 PM Performed  by: Linwood Dibbles, MD Authorized by: Linwood Dibbles, MD   Consent:    Consent obtained:  Verbal   Consent given by:  Patient   Risks discussed:  Bleeding, infection, incomplete drainage and pain   Alternatives discussed:  Referral Universal protocol:    Procedure explained and questions answered to patient or proxy's satisfaction: yes     Relevant documents present and verified: yes     Test results available and properly labeled: yes     Imaging studies available: yes     Immediately prior to procedure, a time out was called: yes     Patient identity confirmed:  Verbally with patient Location:    Location:  Knee Anesthesia (see MAR for exact dosages):    Anesthesia method:  Local infiltration   Local anesthetic:  Lidocaine 1% w/o epi Procedure details:    Preparation: Patient was prepped and draped in usual sterile fashion     Needle gauge:  18 G   Ultrasound guidance: no     Approach:  Lateral   Aspirate amount:  38 ml   Aspirate characteristics:  Bloody   Steroid injected: no     Specimen collected: yes   Post-procedure details:    Dressing:  Adhesive bandage   Patient tolerance of procedure:  Tolerated well, no immediate  complications   (including critical care time)  Medications Ordered in ED Medications  lidocaine (PF) (XYLOCAINE) 1 % injection 30 mL (has no administration in time range)  morphine 4 MG/ML injection 4 mg (4 mg Intravenous Given 06/22/18 1411)     Initial Impression / Assessment and Plan / ED Course  I have reviewed the triage vital signs and the nursing notes.  Pertinent labs & imaging results that were available during my care of the patient were reviewed by me and considered in my medical decision making (see chart for details).   Synovial fluid suggests traumatic etiology.  Likely related to the arthritis.  Dr Charm Barges will follow up on the results.   Final Clinical Impressions(s) / ED Diagnoses   Final diagnoses:  Effusion of right knee    ED Discharge Orders         Ordered    HYDROcodone-acetaminophen (NORCO/VICODIN) 5-325 MG tablet  Every 4 hours PRN     06/22/18 1621           Linwood Dibbles, MD 06/22/18 1621    Linwood Dibbles, MD 06/22/18 [id number]

## 2018-06-23 ENCOUNTER — Encounter (HOSPITAL_COMMUNITY): Payer: Self-pay | Admitting: Emergency Medicine

## 2018-06-23 ENCOUNTER — Emergency Department (HOSPITAL_COMMUNITY)
Admission: EM | Admit: 2018-06-23 | Discharge: 2018-06-23 | Disposition: A | Discharge status: Home / Self Care | Payer: BLUE CROSS/BLUE SHIELD | Attending: Emergency Medicine | Admitting: Emergency Medicine

## 2018-06-23 ENCOUNTER — Other Ambulatory Visit: Payer: Self-pay

## 2018-06-23 DIAGNOSIS — Z794 Long term (current) use of insulin: Secondary | ICD-10-CM | POA: Diagnosis not present

## 2018-06-23 DIAGNOSIS — I10 Essential (primary) hypertension: Secondary | ICD-10-CM | POA: Insufficient documentation

## 2018-06-23 DIAGNOSIS — F17210 Nicotine dependence, cigarettes, uncomplicated: Secondary | ICD-10-CM | POA: Diagnosis not present

## 2018-06-23 DIAGNOSIS — M11261 Other chondrocalcinosis, right knee: Secondary | ICD-10-CM

## 2018-06-23 DIAGNOSIS — Z7982 Long term (current) use of aspirin: Secondary | ICD-10-CM | POA: Insufficient documentation

## 2018-06-23 DIAGNOSIS — M109 Gout, unspecified: Secondary | ICD-10-CM | POA: Diagnosis not present

## 2018-06-23 DIAGNOSIS — Z8673 Personal history of transient ischemic attack (TIA), and cerebral infarction without residual deficits: Secondary | ICD-10-CM | POA: Insufficient documentation

## 2018-06-23 DIAGNOSIS — Z79899 Other long term (current) drug therapy: Secondary | ICD-10-CM | POA: Insufficient documentation

## 2018-06-23 DIAGNOSIS — M11861 Other specified crystal arthropathies, right knee: Secondary | ICD-10-CM | POA: Diagnosis not present

## 2018-06-23 DIAGNOSIS — M25561 Pain in right knee: Secondary | ICD-10-CM | POA: Diagnosis present

## 2018-06-23 DIAGNOSIS — E119 Type 2 diabetes mellitus without complications: Secondary | ICD-10-CM | POA: Insufficient documentation

## 2018-06-23 LAB — CBC WITH DIFFERENTIAL/PLATELET
Abs Immature Granulocytes: 0.07 10*3/uL (ref 0.00–0.07)
Basophils Absolute: 0 10*3/uL (ref 0.0–0.1)
Basophils Relative: 0 %
Eosinophils Absolute: 0 10*3/uL (ref 0.0–0.5)
Eosinophils Relative: 0 %
HCT: 42.5 % (ref 36.0–46.0)
Hemoglobin: 13.5 g/dL (ref 12.0–15.0)
Immature Granulocytes: 1 %
Lymphocytes Relative: 8 %
Lymphs Abs: 1 10*3/uL (ref 0.7–4.0)
MCH: 30.2 pg (ref 26.0–34.0)
MCHC: 31.8 g/dL (ref 30.0–36.0)
MCV: 95.1 fL (ref 80.0–100.0)
Monocytes Absolute: 1.4 10*3/uL — ABNORMAL HIGH (ref 0.1–1.0)
Monocytes Relative: 11 %
Neutro Abs: 10.1 10*3/uL — ABNORMAL HIGH (ref 1.7–7.7)
Neutrophils Relative %: 80 %
Platelets: 306 10*3/uL (ref 150–400)
RBC: 4.47 MIL/uL (ref 3.87–5.11)
RDW: 13.3 % (ref 11.5–15.5)
WBC: 12.6 10*3/uL — ABNORMAL HIGH (ref 4.0–10.5)
nRBC: 0 % (ref 0.0–0.2)

## 2018-06-23 LAB — BASIC METABOLIC PANEL
Anion gap: 14 (ref 5–15)
BUN: 25 mg/dL — ABNORMAL HIGH (ref 8–23)
CO2: 28 mmol/L (ref 22–32)
Calcium: 9.5 mg/dL (ref 8.9–10.3)
Chloride: 93 mmol/L — ABNORMAL LOW (ref 98–111)
Creatinine, Ser: 1.06 mg/dL — ABNORMAL HIGH (ref 0.44–1.00)
GFR calc Af Amer: 60 mL/min — ABNORMAL LOW (ref >60)
GFR calc non Af Amer: 52 mL/min — ABNORMAL LOW (ref >60)
Glucose, Bld: 337 mg/dL — ABNORMAL HIGH (ref 70–99)
Potassium: 4.4 mmol/L (ref 3.5–5.1)
Sodium: 135 mmol/L (ref 135–145)

## 2018-06-23 LAB — CBG MONITORING, ED: Glucose-Capillary: 318 mg/dL — ABNORMAL HIGH (ref 70–99)

## 2018-06-23 MED ORDER — OXYCODONE-ACETAMINOPHEN 5-325 MG PO TABS
1.0000 | ORAL_TABLET | Freq: Once | ORAL | Status: AC
  Administered 2018-06-23: 1 via ORAL
  Filled 2018-06-23: qty 1

## 2018-06-23 MED ORDER — DOCUSATE SODIUM 100 MG PO CAPS: 100.0000 mg | ORAL_CAPSULE | Freq: Two times a day (BID) | ORAL | 0 refills | Status: DC

## 2018-06-23 MED ORDER — SODIUM CHLORIDE 0.9 % IV BOLUS
500.0000 mL | Freq: Once | INTRAVENOUS | Status: AC
  Administered 2018-06-23: 500 mL via INTRAVENOUS

## 2018-06-23 MED ORDER — ONDANSETRON HCL 4 MG/2ML IJ SOLN
4.0000 mg | Freq: Once | INTRAMUSCULAR | Status: AC
  Administered 2018-06-23: 4 mg via INTRAVENOUS
  Filled 2018-06-23: qty 2

## 2018-06-23 MED ORDER — BETAMETHASONE SOD PHOS & ACET 6 (3-3) MG/ML IJ SUSP
12.0000 mg | Freq: Once | INTRAMUSCULAR | Status: AC
  Administered 2018-06-23: 12 mg via INTRAMUSCULAR
  Filled 2018-06-23: qty 5

## 2018-06-23 MED ORDER — FENTANYL CITRATE (PF) 100 MCG/2ML IJ SOLN
50.0000 ug | Freq: Once | INTRAMUSCULAR | Status: AC
  Administered 2018-06-23: 50 ug via INTRAVENOUS
  Filled 2018-06-23: qty 2

## 2018-06-23 MED ORDER — OXYCODONE-ACETAMINOPHEN 5-325 MG PO TABS: 1.0000 | ORAL_TABLET | Freq: Four times a day (QID) | ORAL | 0 refills | Status: DC | PRN

## 2018-06-23 MED ORDER — IPRATROPIUM-ALBUTEROL 0.5-2.5 (3) MG/3ML IN SOLN
3.0000 mL | Freq: Once | RESPIRATORY_TRACT | Status: AC
  Administered 2018-06-23: 3 mL via RESPIRATORY_TRACT
  Filled 2018-06-23: qty 3

## 2018-06-23 MED ORDER — LIDOCAINE HCL (PF) 1 % IJ SOLN
10.0000 mL | Freq: Once | INTRAMUSCULAR | Status: AC
  Administered 2018-06-23: 10 mL via INTRADERMAL
  Filled 2018-06-23: qty 30

## 2018-06-23 NOTE — ED Triage Notes (Signed)
Pt reports that started having swelling in right knee Saturday night/sunday morning. Reports was seen at [facility] yesterday and had some fluid drained off it. Pt reports that still in lots of pain.  At 10am today had pain medication and the new medication was prescribed yesterday.

## 2018-06-23 NOTE — ED Provider Notes (Signed)
[facility]-EMERGENCY DEPT Provider Note   CSN: [id number] Arrival date & time: 06/23/18  1238     History   Chief Complaint Chief Complaint  Patient presents with  . Joint Swelling  . Knee Pain    HPI [name] is a 72 y.o. female who presents the emergency department with chief complaint of severe knee pain.  She has a past medical history of diabetes, Crohn's disease and previous TIA.  She is a daily smoker.  Yesterday the patient was seen at [facility] for a large painful knee effusion on the right.  This occurred in her sleep after doing a lot of active housework 2 days ago.  She had a diagnostic arthrocentesis that showed a large hemarthrosis and calcium pyrophosphate crystals consistent with pseudogout diagnosis.  The patient was started on Norco and colchicine.  She was given crutches at discharge.  Her daughter is by bedside and states that she has been unable to use the crutches effectively and cannot get around her home.  They did purchase a bedside commode which has been helpful.  Patient states that although the swelling is improved her knee pain is worse and uncontrolled by both the colchicine and the Norco.  She denies fevers chills or body aches.  She has no previous history of gout or pseudogout.  HPI  Past Medical History:  Diagnosis Date  . Anemia   . Crohn's colitis (HCC)   . Diabetes mellitus    age 28's  . GERD (gastroesophageal reflux disease)   . Hypercholesterolemia   . Hypertension   . Osteopenia   . Osteopenia   . SBO (small bowel obstruction) (HCC) 09/13/15  . TIA (transient ischemic attack)     Patient Active Problem List   Diagnosis Date Noted  . Small bowel obstruction (HCC) 09/14/2015  . Bowel obstruction (HCC) 09/13/2015  . Hyperlipidemia 08/22/2015  . 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 Right-sided extracranial carotid artery stenosis  08/11/2015  . Type 2 diabetes mellitus (HCC) 08/11/2015  . Accelerated hypertension 08/11/2015  . Tobacco abuse 08/11/2015    Past Surgical History:  Procedure Laterality Date  . COLON SURGERY  07/08/2007  . HERNIA REPAIR  11/21/2009  . ILEOSTOMY  02/26/2008   reversal  . TEE WITHOUT CARDIOVERSION N/A 11/10/2015   Procedure: TRANSESOPHAGEAL ECHOCARDIOGRAM (TEE);  Surgeon: Thurmon Fair, MD;  Location: [facility] ENDOSCOPY;  Service: Cardiovascular;  Laterality: N/A;     OB History   None      Home Medications    Prior to Admission medications   Medication Sig Start Date End Date Taking? Authorizing Provider  amLODipine (NORVASC) 5 MG tablet Take 5 mg by mouth daily.    [provider]  aspirin 81 MG tablet Take 81 mg by mouth daily.    [provider]  Biotin 5000 MCG TABS Take 5,000 mcg by mouth daily.     [provider]  buPROPion (WELLBUTRIN XL) 300 MG 24 hr tablet Take 300 mg by mouth daily.     [provider]  CALCIUM PO Take 1 tablet by mouth daily.      [provider]  Cholecalciferol (VITAMIN D PO) Take 800 Units by mouth daily.     [provider]  CINNAMON PO Take 500 mg by mouth daily.    [provider]  colchicine 0.6 MG tablet Take 1 tablet (0.6 mg total) by mouth every  4 (four) hours as needed. May cause diarrhea 06/22/18   Terrilee Files, MD  fish oil-omega-3 fatty acids 1000 MG capsule Take 2 g by mouth daily.      [provider]  furosemide (LASIX) 20 MG tablet Take 20 mg by mouth as needed.    [provider]  GLUCOSAMINE SULFATE PO Take 500 mg by mouth daily.     [provider]  HYDROcodone-acetaminophen (NORCO/VICODIN) 5-325 MG tablet Take 1 tablet by mouth every 4 (four) hours as needed. 06/22/18   Linwood Dibbles, MD  insulin regular (NOVOLIN R,HUMULIN R) 100 units/mL injection Inject 22 Units into the skin 2 (two) times daily before a meal.    [provider]    Insulin Regular Human (RELION R IJ) Inject 8-10 Units as directed 3 (three) times daily with meals. Breakfast: 8 units Lunch: 10 units Dinner: 10 units    [provider]  levothyroxine (SYNTHROID, LEVOTHROID) 25 MCG tablet Take 25 mcg by mouth daily before breakfast.    [provider]  lisinopril-hydrochlorothiazide (PRINZIDE,ZESTORETIC) 10-12.5 MG tablet Take 1 tablet by mouth daily.    [provider]  lovastatin (MEVACOR) 40 MG tablet Take 40 mg by mouth at bedtime.    [provider]  omeprazole (PRILOSEC) 40 MG capsule Take 40 mg by mouth daily.  05/05/15   [provider]  vitamin E 100 UNIT capsule Take 100 Units by mouth daily.      [provider]    Family History Family History  Problem Relation Age of Onset  . Stroke Mother   . Heart attack Father   . Hypertension Daughter   . Diabetes Brother     Social History Social History   Tobacco Use  . Smoking status: Current Every Day Smoker    Types: Cigarettes  . Smokeless tobacco: Never Used  . Tobacco comment: 09/26/15 5 cigs daily  Substance Use Topics  . Alcohol use: No    Alcohol/week: 0.0 standard drinks  . Drug use: No     Allergies   Simvastatin   Review of Systems Review of Systems  Ten systems reviewed and are negative for acute change, except as noted in the HPI.   Physical Exam Updated Vital Signs BP (!) 147/87 (BP Location: Left Arm)   Pulse (!) 110   Temp 98.5 F (36.9 C) (Oral)   Resp 18   Ht 5\' 3"  (1.6 m)   SpO2 96%   BMI 30.43 kg/m   Physical Exam  Constitutional: She is oriented to person, place, and time. She appears well-developed and well-nourished. No distress.  HENT:  Head: Normocephalic and atraumatic.  Eyes: Pupils are equal, round, and reactive to light. Conjunctivae and EOM are normal. No scleral icterus.  Neck: Normal range of motion.  Cardiovascular: Normal rate, regular rhythm and normal heart sounds. Exam reveals no  gallop and no friction rub.  No murmur heard. Pulmonary/Chest: Effort normal and breath sounds normal. No respiratory distress.  Abdominal: Soft. Bowel sounds are normal. She exhibits no distension and no mass. There is no tenderness. There is no guarding.  Musculoskeletal:  Large Joint effusion R knee warm and tender to palpation.  Able to move the joint however it hurts significantly  Neurological: She is alert and oriented to person, place, and time.  Skin: Skin is warm and dry. She is not diaphoretic.  Psychiatric: Her behavior is normal.  Nursing note and vitals reviewed.    ED Treatments / Results  Labs (all labs ordered are listed, but only abnormal results are displayed) Labs Reviewed  CBG MONITORING, ED - Abnormal; Notable for the following components:      Result Value   Glucose-Capillary 318 (*)    All other components within normal limits    EKG None  Radiology Dg Knee Complete 4 Views Right  Result Date: 06/22/2018 CLINICAL DATA:  Knee pain and swelling 1 day ago. EXAM: RIGHT KNEE - COMPLETE 4+ VIEW COMPARISON:  None. FINDINGS: Joint effusion without fracture or dislocation. Chondrocalcinosis, fairly extensive to be senile. Mild to moderate degenerative marginal spurring greatest at the patellofemoral compartment IMPRESSION: 1. Joint effusion without acute osseous finding. 2. Chondrocalcinosis and degenerative marginal spurring. Electronically Signed   By: Marnee Spring M.D.   On: 06/22/2018 13:36    Procedures .Joint Aspiration/Arthrocentesis Date/Time: 06/23/2018 11:54 PM Performed by: Arthor Captain, PA-C Authorized by: Arthor Captain, PA-C   Consent:    Consent obtained:  Verbal   Consent given by:  Patient   Risks discussed:  Bleeding, infection, incomplete drainage, nerve damage and pain   Alternatives discussed:  No treatment Location:    Location:  Knee Anesthesia (see MAR for exact dosages):    Anesthesia method:  Local infiltration   Local  anesthetic:  Lidocaine 1% w/o epi Procedure details:    Preparation: Patient was prepped and draped in usual sterile fashion     Needle gauge:  18 G   Ultrasound guidance: no     Approach:  Superior   Aspirate amount:  60 cc   Aspirate characteristics:  Bloody   Steroid injected: yes   Post-procedure details:    Dressing:  Adhesive bandage   Patient tolerance of procedure:  Tolerated well, no immediate complications   (including critical care time)  Medications Ordered in ED Medications - No data to display   Initial Impression / Assessment and Plan / ED Course  I have reviewed the triage vital signs and the nursing notes.  Pertinent labs & imaging results that were available during my care of the patient were reviewed by me and considered in my medical decision making (see chart for details).     Patient seen yesterday and diagnosed with pseudogout of the right knee.  She was having significant difficulty ambulating at home.  Patient opted for second arthrocentesis today and I removed 60 cc of bloody aspirate and injected the knee with 2 mL (12 mg) of [place] and 6 mL of 1% lidocaine without epi.  Patient had improvement in her pain without total relief.  Patient had better improvement with oral Percocet tablet and was able to ambulate using a walker which she has at home and toe-touch ambulation.  She appears to be safe to go home this evening and has a bedside commode set up by her daughter.  Patient is insulin dependent and uses sliding scale so hopefully will be able to correct given her recent steroid injection.  Patient otherwise appears appropriate for discharge.  She is no evidence of septic joint.  No fevers or chills.  Discussed return precautions with the patient.  Final Clinical Impressions(s) / ED Diagnoses   Final diagnoses:  None    ED Discharge Orders    None       Arthor Captain, PA-C 06/23/18 2359    Cathren Laine, MD 06/24/18 1510

## 2018-06-23 NOTE — Discharge Instructions (Signed)
Contact a health care provider if: °· Your knee pain, swelling, or stiffness returns or gets worse. °· You have redness, swelling, or pain in your puncture area. °· You have fluid, blood, or pus coming from your puncture site. °· You have warmth in your puncture area. °· You have a fever. °· Your pain is not controlled with medicine. ° °

## 2018-06-24 LAB — GLUCOSE, BODY FLUID OTHER: Glucose, Body Fluid Other: 181 mg/dL

## 2018-06-27 LAB — CULTURE, BODY FLUID W GRAM STAIN -BOTTLE: Culture: NO GROWTH

## 2018-07-08 DIAGNOSIS — M109 Gout, unspecified: Secondary | ICD-10-CM | POA: Diagnosis not present

## 2018-07-08 DIAGNOSIS — M810 Age-related osteoporosis without current pathological fracture: Secondary | ICD-10-CM | POA: Diagnosis not present

## 2018-07-08 DIAGNOSIS — I10 Essential (primary) hypertension: Secondary | ICD-10-CM | POA: Diagnosis not present

## 2018-07-10 DIAGNOSIS — M109 Gout, unspecified: Secondary | ICD-10-CM | POA: Diagnosis not present

## 2018-07-10 DIAGNOSIS — M25461 Effusion, right knee: Secondary | ICD-10-CM | POA: Diagnosis not present

## 2018-07-10 DIAGNOSIS — M25569 Pain in unspecified knee: Secondary | ICD-10-CM | POA: Diagnosis not present

## 2018-07-10 DIAGNOSIS — M1990 Unspecified osteoarthritis, unspecified site: Secondary | ICD-10-CM | POA: Diagnosis not present

## 2018-07-10 DIAGNOSIS — M064 Inflammatory polyarthropathy: Secondary | ICD-10-CM | POA: Diagnosis not present

## 2018-07-16 DIAGNOSIS — E7800 Pure hypercholesterolemia, unspecified: Secondary | ICD-10-CM | POA: Diagnosis not present

## 2018-07-16 DIAGNOSIS — E02 Subclinical iodine-deficiency hypothyroidism: Secondary | ICD-10-CM | POA: Diagnosis not present

## 2018-07-16 DIAGNOSIS — E1065 Type 1 diabetes mellitus with hyperglycemia: Secondary | ICD-10-CM | POA: Diagnosis not present

## 2018-07-16 DIAGNOSIS — I10 Essential (primary) hypertension: Secondary | ICD-10-CM | POA: Diagnosis not present

## 2018-07-16 DIAGNOSIS — N183 Chronic kidney disease, stage 3 (moderate): Secondary | ICD-10-CM | POA: Diagnosis not present

## 2018-07-22 DIAGNOSIS — M064 Inflammatory polyarthropathy: Secondary | ICD-10-CM | POA: Diagnosis not present

## 2018-07-22 DIAGNOSIS — M1990 Unspecified osteoarthritis, unspecified site: Secondary | ICD-10-CM | POA: Diagnosis not present

## 2018-07-22 DIAGNOSIS — M25569 Pain in unspecified knee: Secondary | ICD-10-CM | POA: Diagnosis not present

## 2018-07-22 DIAGNOSIS — M109 Gout, unspecified: Secondary | ICD-10-CM | POA: Diagnosis not present

## 2018-07-22 DIAGNOSIS — M25461 Effusion, right knee: Secondary | ICD-10-CM | POA: Diagnosis not present

## 2018-10-07 DIAGNOSIS — E1065 Type 1 diabetes mellitus with hyperglycemia: Secondary | ICD-10-CM | POA: Diagnosis not present

## 2018-10-07 DIAGNOSIS — I10 Essential (primary) hypertension: Secondary | ICD-10-CM | POA: Diagnosis not present

## 2018-10-13 DIAGNOSIS — J069 Acute upper respiratory infection, unspecified: Secondary | ICD-10-CM | POA: Diagnosis not present

## 2018-10-14 DIAGNOSIS — E02 Subclinical iodine-deficiency hypothyroidism: Secondary | ICD-10-CM | POA: Diagnosis not present

## 2018-10-14 DIAGNOSIS — K5090 Crohn's disease, unspecified, without complications: Secondary | ICD-10-CM | POA: Diagnosis not present

## 2018-10-14 DIAGNOSIS — M064 Inflammatory polyarthropathy: Secondary | ICD-10-CM | POA: Diagnosis not present

## 2018-10-14 DIAGNOSIS — I10 Essential (primary) hypertension: Secondary | ICD-10-CM | POA: Diagnosis not present

## 2018-10-14 DIAGNOSIS — M810 Age-related osteoporosis without current pathological fracture: Secondary | ICD-10-CM | POA: Diagnosis not present

## 2018-10-14 DIAGNOSIS — E7800 Pure hypercholesterolemia, unspecified: Secondary | ICD-10-CM | POA: Diagnosis not present

## 2018-10-14 DIAGNOSIS — J069 Acute upper respiratory infection, unspecified: Secondary | ICD-10-CM | POA: Diagnosis not present

## 2018-10-14 DIAGNOSIS — I5032 Chronic diastolic (congestive) heart failure: Secondary | ICD-10-CM | POA: Diagnosis not present

## 2018-10-14 DIAGNOSIS — J449 Chronic obstructive pulmonary disease, unspecified: Secondary | ICD-10-CM | POA: Diagnosis not present

## 2018-10-14 DIAGNOSIS — Z0000 Encounter for general adult medical examination without abnormal findings: Secondary | ICD-10-CM | POA: Diagnosis not present

## 2018-10-14 DIAGNOSIS — E1065 Type 1 diabetes mellitus with hyperglycemia: Secondary | ICD-10-CM | POA: Diagnosis not present

## 2018-10-14 DIAGNOSIS — N183 Chronic kidney disease, stage 3 (moderate): Secondary | ICD-10-CM | POA: Diagnosis not present

## 2019-01-09 DIAGNOSIS — E7800 Pure hypercholesterolemia, unspecified: Secondary | ICD-10-CM | POA: Diagnosis not present

## 2019-01-09 DIAGNOSIS — E1065 Type 1 diabetes mellitus with hyperglycemia: Secondary | ICD-10-CM | POA: Diagnosis not present

## 2019-01-09 DIAGNOSIS — E02 Subclinical iodine-deficiency hypothyroidism: Secondary | ICD-10-CM | POA: Diagnosis not present

## 2019-01-14 DIAGNOSIS — I10 Essential (primary) hypertension: Secondary | ICD-10-CM | POA: Diagnosis not present

## 2019-01-14 DIAGNOSIS — E1065 Type 1 diabetes mellitus with hyperglycemia: Secondary | ICD-10-CM | POA: Diagnosis not present

## 2019-01-14 DIAGNOSIS — E559 Vitamin D deficiency, unspecified: Secondary | ICD-10-CM | POA: Diagnosis not present

## 2019-01-14 DIAGNOSIS — E02 Subclinical iodine-deficiency hypothyroidism: Secondary | ICD-10-CM | POA: Diagnosis not present

## 2019-01-14 DIAGNOSIS — M8589 Other specified disorders of bone density and structure, multiple sites: Secondary | ICD-10-CM | POA: Diagnosis not present

## 2019-01-14 DIAGNOSIS — E7800 Pure hypercholesterolemia, unspecified: Secondary | ICD-10-CM | POA: Diagnosis not present

## 2019-03-16 DIAGNOSIS — E7800 Pure hypercholesterolemia, unspecified: Secondary | ICD-10-CM | POA: Diagnosis not present

## 2019-03-17 ENCOUNTER — Other Ambulatory Visit: Payer: Self-pay

## 2019-03-17 ENCOUNTER — Emergency Department (HOSPITAL_BASED_OUTPATIENT_CLINIC_OR_DEPARTMENT_OTHER)
Admission: EM | Admit: 2019-03-17 | Discharge: 2019-03-17 | Disposition: A | Discharge status: Home / Self Care | Payer: Medicare Other | Attending: Emergency Medicine | Admitting: Emergency Medicine

## 2019-03-17 ENCOUNTER — Emergency Department (HOSPITAL_BASED_OUTPATIENT_CLINIC_OR_DEPARTMENT_OTHER): Payer: Medicare Other

## 2019-03-17 DIAGNOSIS — F17210 Nicotine dependence, cigarettes, uncomplicated: Secondary | ICD-10-CM | POA: Diagnosis not present

## 2019-03-17 DIAGNOSIS — I10 Essential (primary) hypertension: Secondary | ICD-10-CM | POA: Insufficient documentation

## 2019-03-17 DIAGNOSIS — Z7982 Long term (current) use of aspirin: Secondary | ICD-10-CM | POA: Insufficient documentation

## 2019-03-17 DIAGNOSIS — Z794 Long term (current) use of insulin: Secondary | ICD-10-CM | POA: Diagnosis not present

## 2019-03-17 DIAGNOSIS — M25461 Effusion, right knee: Secondary | ICD-10-CM | POA: Diagnosis not present

## 2019-03-17 DIAGNOSIS — W19XXXA Unspecified fall, initial encounter: Secondary | ICD-10-CM | POA: Diagnosis not present

## 2019-03-17 DIAGNOSIS — M25561 Pain in right knee: Secondary | ICD-10-CM | POA: Diagnosis not present

## 2019-03-17 DIAGNOSIS — E119 Type 2 diabetes mellitus without complications: Secondary | ICD-10-CM | POA: Insufficient documentation

## 2019-03-17 DIAGNOSIS — M10061 Idiopathic gout, right knee: Secondary | ICD-10-CM | POA: Diagnosis not present

## 2019-03-17 DIAGNOSIS — M109 Gout, unspecified: Secondary | ICD-10-CM | POA: Diagnosis not present

## 2019-03-17 DIAGNOSIS — Z79899 Other long term (current) drug therapy: Secondary | ICD-10-CM | POA: Diagnosis not present

## 2019-03-17 DIAGNOSIS — M1711 Unilateral primary osteoarthritis, right knee: Secondary | ICD-10-CM | POA: Diagnosis not present

## 2019-03-17 MED ORDER — COLCHICINE 0.6 MG PO TABS: 0.6000 mg | ORAL_TABLET | ORAL | 0 refills | Status: DC | PRN

## 2019-03-17 MED ORDER — HYDROCODONE-ACETAMINOPHEN 5-325 MG PO TABS: 2.0000 | ORAL_TABLET | ORAL | 0 refills | Status: DC | PRN

## 2019-03-17 NOTE — ED Provider Notes (Signed)
[facility] EMERGENCY DEPARTMENT Provider Note   CSN: [id number] Arrival date & time: 03/17/19  1632    History   Chief Complaint Chief Complaint  Patient presents with  . Knee Pain    HPI [name] is a 73 y.o. female with PMHx crohns, diabetes, GERD, HTN, Gout, who presents to the ED today complaining of gradual onset, constant, right knee pain x 2-3 weeks, worsening after a mechanical fall last night. Pt reports hx of gout in the same knee last year. She states that for the past 2-3 weeks it has been intermittently bothering her and felt similar to her gout. She has been applying topical ointment without relief. She reports that last night she slipped on a wet floor and her right leg went out under her. No head injury or LOC. She did not fall on the right knee but states she twisted her leg in a way that caused worsening pain to the area, prompting her to come to the ED today. Denies fever, chills, or any other associated symptoms.        Past Medical History:  Diagnosis Date  . Anemia   . Crohn's colitis ([place])   . Diabetes mellitus    age 73's  . GERD (gastroesophageal reflux disease)   . Hypercholesterolemia   . Hypertension   . Osteopenia   . Osteopenia   . SBO (small bowel obstruction) ([place]) 09/13/15  . TIA (transient ischemic attack)     Patient Active Problem List   Diagnosis Date Noted  . Small bowel obstruction ([place]) 09/14/2015  . Bowel obstruction ([place]) 09/13/2015  . Hyperlipidemia 08/22/2015  . Cerebrovascular accident (CVA) due to embolism of left middle cerebral artery ([place]) 08/11/2015  . Right-sided extracranial carotid artery stenosis 08/11/2015  . Type 2 diabetes mellitus ([place]) 08/11/2015  . Accelerated hypertension 08/11/2015  . Tobacco abuse 08/11/2015    Past Surgical History:  Procedure Laterality Date  . COLON SURGERY  07/08/2007  . HERNIA REPAIR  11/21/2009  . ILEOSTOMY  02/26/2008   reversal  . TEE WITHOUT CARDIOVERSION N/A  11/10/2015   Procedure: TRANSESOPHAGEAL ECHOCARDIOGRAM (TEE);  Surgeon: Sanda Klein, MD;  Location: [facility] ENDOSCOPY;  Service: Cardiovascular;  Laterality: N/A;     OB History   No obstetric history on file.      Home Medications    Prior to Admission medications   Medication Sig Start Date End Date Taking? Authorizing Provider  amLODipine (NORVASC) 5 MG tablet Take 5 mg by mouth daily.    [provider]  aspirin 81 MG tablet Take 81 mg by mouth daily.    [provider]  Biotin 5000 MCG TABS Take 5,000 mcg by mouth daily.     [provider]  buPROPion (WELLBUTRIN XL) 300 MG 24 hr tablet Take 300 mg by mouth daily.     [provider]  CALCIUM PO Take 1 tablet by mouth daily.      [provider]  Cholecalciferol (VITAMIN D PO) Take 800 Units by mouth daily.     [provider]  CINNAMON PO Take 500 mg by mouth daily.    [provider]  colchicine 0.6 MG tablet Take 1 tablet (0.6 mg total) by mouth every 4 (four) hours as needed. May cause diarrhea 03/17/19   Alroy Bailiff, Fairy Ashlock, PA-C  docusate sodium (COLACE) 100 MG capsule Take 1 capsule (100 mg total) by mouth every 12 (twelve) hours. 06/23/18   Margarita Mail, PA-C  fish oil-omega-3  fatty acids 1000 MG capsule Take 2 g by mouth daily.      [provider]  furosemide (LASIX) 20 MG tablet Take 20 mg by mouth as needed.    [provider]  GLUCOSAMINE SULFATE PO Take 500 mg by mouth daily.     [provider]  HYDROcodone-acetaminophen (NORCO/VICODIN) 5-325 MG tablet Take 1 tablet by mouth every 4 (four) hours as needed. 06/22/18   Linwood DibblesKnapp, Jon, MD  HYDROcodone-acetaminophen (NORCO/VICODIN) 5-325 MG tablet Take 2 tablets by mouth every 4 (four) hours as needed. 03/17/19   Liridona Mashaw, PA-C  insulin regular (NOVOLIN R,HUMULIN R) 100 units/mL injection Inject 22 Units into the skin 2 (two) times daily before a meal.    [provider]   Insulin Regular Human (RELION R IJ) Inject 8-10 Units as directed 3 (three) times daily with meals. Breakfast: 8 units Lunch: 10 units Dinner: 10 units    [provider]  levothyroxine (SYNTHROID, LEVOTHROID) 50 MCG tablet Take 50 mcg by mouth daily. 04/13/18   [provider]  lisinopril-hydrochlorothiazide (PRINZIDE,ZESTORETIC) 10-12.5 MG tablet Take 1 tablet by mouth daily.    [provider]  lovastatin (MEVACOR) 40 MG tablet Take 40 mg by mouth at bedtime.    [provider]  omeprazole (PRILOSEC) 40 MG capsule Take 40 mg by mouth daily.  05/05/15   [provider]  oxyCODONE-acetaminophen (PERCOCET) 5-325 MG tablet Take 1-2 tablets by mouth every 6 (six) hours as needed for severe pain. 06/23/18   Arthor CaptainHarris, Abigail, PA-C  vitamin E 100 UNIT capsule Take 100 Units by mouth daily.      [provider]    Family History Family History  Problem Relation Age of Onset  . Stroke Mother   . Heart attack Father   . Hypertension Daughter   . Diabetes Brother     Social History Social History   Tobacco Use  . Smoking status: Current Every Day Smoker    Types: Cigarettes  . Smokeless tobacco: Never Used  . Tobacco comment: 09/26/15 5 cigs daily  Substance Use Topics  . Alcohol use: No    Alcohol/week: 0.0 standard drinks  . Drug use: No     Allergies   Simvastatin   Review of Systems Review of Systems  Constitutional: Negative for chills and fever.  Musculoskeletal: Positive for arthralgias.  Skin: Positive for color change.     Physical Exam Updated Vital Signs BP (!) 161/81 (BP Location: Right Arm)   Pulse (!) 101   Temp 98.4 F (36.9 C) (Oral)   Resp 18   Ht 5\' 3"  (1.6 m)   Wt 72.1 kg   SpO2 100%   BMI 28.17 kg/m   Physical Exam Vitals signs and nursing note reviewed.  Constitutional:      Appearance: She is not ill-appearing.  HENT:     Head: Normocephalic and atraumatic.  Eyes:     Conjunctiva/sclera:  Conjunctivae normal.  Cardiovascular:     Rate and Rhythm: Normal rate and regular rhythm.  Pulmonary:     Effort: Pulmonary effort is normal.     Breath sounds: Normal breath sounds.  Musculoskeletal:     Comments: Swelling noted to right knee compared to left with increased warmth to the touch; mildly erythematous as well. TTP diffusely to joint. ROM limited due to pain. Sensation intact. Good distal pulses.   Skin:    General: Skin is warm and dry.     Coloration: Skin is not  jaundiced.  Neurological:     Mental Status: She is alert.      ED Treatments / Results  Labs (all labs ordered are listed, but only abnormal results are displayed) Labs Reviewed - No data to display  EKG None  Radiology Dg Knee Complete 4 Views Right  Result Date: 03/17/2019 CLINICAL DATA:  Knee pain, hot joint, history of gout EXAM: RIGHT KNEE - COMPLETE 4+ VIEW COMPARISON:  Right knee radiographs 06/22/2018 FINDINGS: There are tricompartmental degenerative changes maximally at the patellofemoral compartment. There is meniscal chondrocalcinosis. There is a large right knee joint effusion resulting and bowing of the distal quadriceps tendon. Well-corticated ovoid mineralization along the medial femoral condyle may reflect remote injury. No acute bony abnormality. Specifically, no fracture, subluxation, or dislocation. Vascular calcifications are noted in the posterior knee. IMPRESSION: Tricompartmental degenerative changes and large right knee joint effusion. No acute osseous abnormality. Electronically Signed   By: Kreg ShropshirePrice  DeHay M.D.   On: 03/17/2019 18:44    Procedures Procedures (including critical care time)  Medications Ordered in ED Medications - No data to display   Initial Impression / Assessment and Plan / ED Course  I have reviewed the triage vital signs and the nursing notes.  Pertinent labs & imaging results that were available during my care of the patient were reviewed by me and  considered in my medical decision making (see chart for details).  Clinical Course as of Mar 18 27  Tue Mar 17, 2019  1859 Large joint effusion  DG Knee Complete 4 Views Right [MV]    Clinical Course User Index [MV] Tanda RockersVenter, Kayson Tasker, [place] JerseyPA-C   73 year old female presenting to the ED with right knee pain, hx of gout. Knee is warm to the touch and swollen. ROM decreased. Reports the pain worsened after she tripped last night and twisted her knee. Will get xray today although high suspicion for gout. Will treat accordingly. Low suspicion for septic joint. Pt without complaints of fever or chills. She is afebrile in the ED without tachycardia or tachypnea.   Xray with large joint effusion. Dr. Deretha EmoryZackowski attending physician also evaluated patient; suggests knee immobilizer/follow up with ortho. Also suggests treating for gout. Will prescribe colchicine at this time; hesitant to give prednisone with pts history of diabetes. She is in agreement with plan at this time and stable for discharge home.        Final Clinical Impressions(s) / ED Diagnoses   Final diagnoses:  Acute pain of right knee  Effusion of right knee  Acute gout of right knee, unspecified cause    ED Discharge Orders         Ordered    colchicine 0.6 MG tablet  Every 4 hours PRN,   Status:  Discontinued     03/17/19 1918    HYDROcodone-acetaminophen (NORCO/VICODIN) 5-325 MG tablet  Every 4 hours PRN     03/17/19 1918    colchicine 0.6 MG tablet  Every 4 hours PRN     03/17/19 1920           Tanda RockersVenter, Maliik Karner, PA-C 03/18/19 0029    Vanetta MuldersZackowski, Scott, MD 03/18/19 [id number]

## 2019-03-17 NOTE — ED Notes (Signed)
Spoke with pt. About using knee immobilizer and using caution with getting up to move around and go to restroom or move around the house.

## 2019-03-17 NOTE — ED Provider Notes (Signed)
Medical screening examination/treatment/procedure(s) were conducted as a shared visit with non-physician practitioner(s) and myself.  I personally evaluated the patient during the encounter.      Results for orders placed or performed during the hospital encounter of 06/23/18  CBC with Differential  Result Value Ref Range   WBC 12.6 (H) 4.0 - 10.5 K/uL   RBC 4.47 3.87 - 5.11 MIL/uL   Hemoglobin 13.5 12.0 - 15.0 g/dL   HCT 42.5 36.0 - 46.0 %   MCV 95.1 80.0 - 100.0 fL   MCH 30.2 26.0 - 34.0 pg   MCHC 31.8 30.0 - 36.0 g/dL   RDW 13.3 11.5 - 15.5 %   Platelets 306 150 - 400 K/uL   nRBC 0.0 0.0 - 0.2 %   Neutrophils Relative % 80 %   Neutro Abs 10.1 (H) 1.7 - 7.7 K/uL   Lymphocytes Relative 8 %   Lymphs Abs 1.0 0.7 - 4.0 K/uL   Monocytes Relative 11 %   Monocytes Absolute 1.4 (H) 0.1 - 1.0 K/uL   Eosinophils Relative 0 %   Eosinophils Absolute 0.0 0.0 - 0.5 K/uL   Basophils Relative 0 %   Basophils Absolute 0.0 0.0 - 0.1 K/uL   WBC Morphology WHITE COUNT CONFIRMED ON SMEAR    Immature Granulocytes 1 %   Abs Immature Granulocytes 0.07 0.00 - 0.07 K/uL  Basic metabolic panel  Result Value Ref Range   Sodium 135 135 - 145 mmol/L   Potassium 4.4 3.5 - 5.1 mmol/L   Chloride 93 (L) 98 - 111 mmol/L   CO2 28 22 - 32 mmol/L   Glucose, Bld 337 (H) 70 - 99 mg/dL   BUN 25 (H) 8 - 23 mg/dL   Creatinine, Ser 1.06 (H) 0.44 - 1.00 mg/dL   Calcium 9.5 8.9 - 10.3 mg/dL   GFR calc non Af Amer 52 (L) >60 mL/min   GFR calc Af Amer 60 (L) >60 mL/min   Anion gap 14 5 - 15  CBG monitoring, ED  Result Value Ref Range   Glucose-Capillary 318 (H) 70 - 99 mg/dL   Dg Knee Complete 4 Views Right  Result Date: 03/17/2019 CLINICAL DATA:  Knee pain, hot joint, history of gout EXAM: RIGHT KNEE - COMPLETE 4+ VIEW COMPARISON:  Right knee radiographs 06/22/2018 FINDINGS: There are tricompartmental degenerative changes maximally at the patellofemoral compartment. There is meniscal chondrocalcinosis. There is  a large right knee joint effusion resulting and bowing of the distal quadriceps tendon. Well-corticated ovoid mineralization along the medial femoral condyle may reflect remote injury. No acute bony abnormality. Specifically, no fracture, subluxation, or dislocation. Vascular calcifications are noted in the posterior knee. IMPRESSION: Tricompartmental degenerative changes and large right knee joint effusion. No acute osseous abnormality. Electronically Signed   By: Lovena Le M.D.   On: 03/17/2019 18:44    Patient seen by me along with physician assistant.  Patient's had some pain in her right knee for the past few weeks.  Which she felt was a flare of her gout.  Known to have gout.  But she slipped and had a fall last evening and then shortly after that the knee started to really swell.  Clinically there is a pretty significant knee effusion.  X-ray showed no bony abnormalities.  She does have tricompartmental arthritic changes.  But patient could have done some internal ligamental injury.  We will go ahead and treat for gout.  We will also put in a knee immobilizer and have her follow-up  with sports medicine upstairs.  No concerns for acute joint infection.   Significant effusion to the right knee.  No obvious deformity.  Little bit of redness over the patellar area.  No redness spreading out through the medial and lateral sides of the knee joint.  Distally good cap refill and dorsalis pedis pulses 2+.  Good range of motion of toes foot and ankle.   Vanetta Mulders, MD 03/17/19 [phone]

## 2019-03-17 NOTE — Discharge Instructions (Signed)
Please follow up with Dr. Barbaraann Barthel regarding your knee pain; you have fluid in your knee and you likely injured it when you fell last night We are also treating you for gout today given you had pain prior to the fall Please take medication as prescribed Use Knee immobilizer and crutches until you see Dr. Barbaraann Barthel for further eval

## 2019-03-17 NOTE — ED Triage Notes (Signed)
Pt c/o right knee pain that  Pt states is r/t a fall last night. Pt assisted from car, in wheelchair. Pt states unable to bear weight. Hx of gout

## 2019-03-19 ENCOUNTER — Other Ambulatory Visit: Payer: Self-pay

## 2019-03-19 ENCOUNTER — Encounter: Payer: Self-pay | Admitting: Family Medicine

## 2019-03-19 ENCOUNTER — Ambulatory Visit (INDEPENDENT_AMBULATORY_CARE_PROVIDER_SITE_OTHER): Payer: Medicare Other | Admitting: Family Medicine

## 2019-03-19 ENCOUNTER — Ambulatory Visit: Payer: Self-pay

## 2019-03-19 VITALS — BP 127/79 | HR 99 | Ht 63.0 in | Wt 158.0 lb

## 2019-03-19 DIAGNOSIS — M25561 Pain in right knee: Secondary | ICD-10-CM

## 2019-03-19 MED ORDER — TRIAMCINOLONE ACETONIDE 40 MG/ML IJ SUSP
40.0000 mg | Freq: Once | INTRAMUSCULAR | Status: AC
  Administered 2019-03-19: 11:00:00 40 mg via INTRA_ARTICULAR

## 2019-03-19 NOTE — Progress Notes (Signed)
Joanne FergusonJane C Oconnell - 73 y.o. female MRN [id number]  Date of birth: Jan 17, 1946  SUBJECTIVE:  Including CC & ROS.  Chief Complaint  Patient presents with  . Knee Pain    right knee    Joanne FergusonJane C Oconnell is a 73 y.o. female that is presenting with acute right knee pain.  She sustained an injury where she had a hyperextension type injury.  Since that time she has had significant swelling and generalized pain of the knee.  She is unable to ambulate without pain.  The pain is severe.  She has been placed in knee immobilizer after being evaluated in the emergency department.  No prior history of surgery to the knee.  Denies any numbness or tingling.  Pain is localized to the knee.  Independent review of the right knee x-ray from 7/14 shows findings suggestive of pseudogout and degenerative changes.   Review of Systems  Constitutional: Negative for fever.  HENT: Negative for congestion.   Respiratory: Negative for cough.   Cardiovascular: Negative for chest pain.  Gastrointestinal: Negative for abdominal pain.  Musculoskeletal: Positive for gait problem and joint swelling.  Skin: Negative for color change.  Neurological: Negative for weakness.  Hematological: Negative for adenopathy.    HISTORY: Past Medical, Surgical, Social, and Family History Reviewed & Updated per EMR.   Pertinent Historical Findings include:  Past Medical History:  Diagnosis Date  . Anemia   . Crohn's colitis (HCC)   . Diabetes mellitus    age 73's  . GERD (gastroesophageal reflux disease)   . Hypercholesterolemia   . Hypertension   . Osteopenia   . Osteopenia   . SBO (small bowel obstruction) (HCC) 09/13/15  . TIA (transient ischemic attack)     Past Surgical History:  Procedure Laterality Date  . COLON SURGERY  07/08/2007  . HERNIA REPAIR  11/21/2009  . ILEOSTOMY  02/26/2008   reversal  . TEE WITHOUT CARDIOVERSION N/A 11/10/2015   Procedure: TRANSESOPHAGEAL ECHOCARDIOGRAM (TEE);  Surgeon: Thurmon FairMihai Croitoru, MD;   [facility];  Service: Cardiovascular;  Laterality: N/A;    Allergies  Allergen Reactions  . Simvastatin Other (See Comments)    Muscle cramps    Family History  Problem Relation Age of Onset  . Stroke Mother   . Heart attack Father   . Hypertension Daughter   . Diabetes Brother      Social History   Socioeconomic History  . Marital status: Married    Spouse name: Not on file  . Number of children: Not on file  . Years of education: Not on file  . Highest education level: Not on file  Occupational History  . Not on file  Social Needs  . Financial resource strain: Not on file  . Food insecurity    Worry: Not on file    Inability: Not on file  . Transportation needs    Medical: Not on file    Non-medical: Not on file  Tobacco Use  . Smoking status: Current Every Day Smoker    Types: Cigarettes  . Smokeless tobacco: Never Used  . Tobacco comment: 09/26/15 5 cigs daily  Substance and Sexual Activity  . Alcohol use: No    Alcohol/week: 0.0 standard drinks  . Drug use: No  . Sexual activity: Not on file  Lifestyle  . Physical activity    Days per week: Not on file    Minutes per session: Not on file  . Stress: Not on file  Relationships  .  Social Herbalist on phone: Not on file    Gets together: Not on file    Attends religious service: Not on file    Active member of club or organization: Not on file    Attends meetings of clubs or organizations: Not on file    Relationship status: Not on file  . Intimate partner violence    Fear of current or ex partner: Not on file    Emotionally abused: Not on file    Physically abused: Not on file    Forced sexual activity: Not on file  Other Topics Concern  . Not on file  Social History Narrative  . Not on file     PHYSICAL EXAM:  VS: BP 127/79   Pulse 99   Ht 5\' 3"  (1.6 m)   Wt 158 lb (71.7 kg)   BMI 27.99 kg/m  Physical Exam Gen: NAD, alert, cooperative with exam, well-appearing  ENT: normal lips, normal nasal mucosa,  Eye: normal EOM, normal conjunctiva and lids CV:  no edema, +2 pedal pulses   Resp: no accessory muscle use, non-labored,  Skin: no rashes, no areas of induration  Neuro: normal tone, normal sensation to touch Psych:  normal insight, alert and oriented MSK:  Right knee: Significant effusion. Limited flexion and extension. Tenderness to palpation of the medial lateral joint line. Exam limited secondary to pain. Neurovascular intact.   Aspiration/Injection Procedure Note [name] Nov 19, 1945  Procedure: Aspiration and Injection Indications: Right knee pain  Procedure Details Consent: Risks of procedure as well as the alternatives and risks of each were explained to the (patient/caregiver).  Consent for procedure obtained. Time Out: Verified patient identification, verified procedure, site/side was marked, verified correct patient position, special equipment/implants available, medications/allergies/relevent history reviewed, required imaging and test results available.  Performed.  The area was cleaned with iodine and alcohol swabs.    The right knee superior lateral suprapatellar pouch was injected with 3 cc of 1% lidocaine without epinephrine.  An 18-gauge 1 1/2" was inserted under ultrasound guidance.  Aspiration was achieved.  The syringe was switched and a mixture containing 1 cc's of 40 mg Kenalog and 4 cc's of 0.25% bupivacaine.  Ultrasound was used. Images were obtained in long views showing the injection.    Amount of Fluid Aspirated: 50mL Character of Fluid: bloody Fluid was sent for:n/a  A sterile dressing was applied.  Patient did tolerate procedure well.       ASSESSMENT & PLAN:   Acute pain of right knee Hemarthrosis of the knee.  Concerning for ACL or PCL tear.  Unable to examine clinically due to pain. -Aspiration injection. -Placed in a hinged knee brace. -Counseled on supportive care -Follow-up in 2 weeks.

## 2019-03-19 NOTE — Patient Instructions (Signed)
Nice to meet you Please try volataren. Over the counter rub on medicine  Please use tylenol  Please use ice  Please try the rolling walker   Please send me a message in MyChart with any questions or updates.  Please see me back in 2 weeks.   --Dr. Raeford Razor

## 2019-03-20 DIAGNOSIS — M25561 Pain in right knee: Secondary | ICD-10-CM | POA: Insufficient documentation

## 2019-03-20 NOTE — Assessment & Plan Note (Signed)
Hemarthrosis of the knee.  Concerning for ACL or PCL tear.  Unable to examine clinically due to pain. -Aspiration injection. -Placed in a hinged knee brace. -Counseled on supportive care -Follow-up in 2 weeks.

## 2019-03-31 ENCOUNTER — Telehealth: Payer: Self-pay | Admitting: Family Medicine

## 2019-03-31 NOTE — Telephone Encounter (Signed)
Left VM for patient. If she calls back please have him speak with a nurse/CMA and ask if she wants continuous leave or interrupted and what her job requires.   If any questions then please take the best time and phone number to call and I will try to call her back.   Rosemarie Ax, MD [facility] 03/31/2019, 8:31 AM

## 2019-04-02 ENCOUNTER — Other Ambulatory Visit: Payer: Self-pay

## 2019-04-02 ENCOUNTER — Encounter: Payer: Self-pay | Admitting: Family Medicine

## 2019-04-02 ENCOUNTER — Ambulatory Visit: Payer: Medicare Other | Admitting: Family Medicine

## 2019-04-02 VITALS — Ht 63.0 in | Wt 158.0 lb

## 2019-04-02 DIAGNOSIS — M25561 Pain in right knee: Secondary | ICD-10-CM

## 2019-04-02 NOTE — Progress Notes (Signed)
[name] - 73 y.o. female MRN [id number]  Date of birth: 1945/12/05  SUBJECTIVE:  Including CC & ROS.  No chief complaint on file.   [name] is a 73 y.o. female that is following up for her right knee pain.  She had an aspiration and injection.  She reports significant improvement of her pain.  She does still have pain with ambulation.  The pain seems to be more anterior in nature.  It may give way if she steps wrong.   Review of Systems  Constitutional: Negative for fever.  HENT: Negative for congestion.   Respiratory: Negative for cough.   Cardiovascular: Negative for chest pain.  Musculoskeletal: Positive for arthralgias, gait problem and joint swelling.  Skin: Negative for color change.  Neurological: Negative for weakness.  Hematological: Negative for adenopathy.    HISTORY: Past Medical, Surgical, Social, and Family History Reviewed & Updated per EMR.   Pertinent Historical Findings include:  Past Medical History:  Diagnosis Date  . Anemia   . Crohn's colitis ([place])   . Diabetes mellitus    age 69's  . GERD (gastroesophageal reflux disease)   . Hypercholesterolemia   . Hypertension   . Osteopenia   . Osteopenia   . SBO (small bowel obstruction) ([place]) 09/13/15  . TIA (transient ischemic attack)     Past Surgical History:  Procedure Laterality Date  . COLON SURGERY  07/08/2007  . HERNIA REPAIR  11/21/2009  . ILEOSTOMY  02/26/2008   reversal  . TEE WITHOUT CARDIOVERSION N/A 11/10/2015   Procedure: TRANSESOPHAGEAL ECHOCARDIOGRAM (TEE);  Surgeon: Sanda Klein, MD;  Location: [facility] ENDOSCOPY;  Service: Cardiovascular;  Laterality: N/A;    Allergies  Allergen Reactions  . Simvastatin Other (See Comments)    Muscle cramps    Family History  Problem Relation Age of Onset  . Stroke Mother   . Heart attack Father   . Hypertension Daughter   . Diabetes Brother      Social History   Socioeconomic History  . Marital status: Married    Spouse name:  Not on file  . Number of children: Not on file  . Years of education: Not on file  . Highest education level: Not on file  Occupational History  . Not on file  Social Needs  . Financial resource strain: Not on file  . Food insecurity    Worry: Not on file    Inability: Not on file  . Transportation needs    Medical: Not on file    Non-medical: Not on file  Tobacco Use  . Smoking status: Current Every Day Smoker    Types: Cigarettes  . Smokeless tobacco: Never Used  . Tobacco comment: 09/26/15 5 cigs daily  Substance and Sexual Activity  . Alcohol use: No    Alcohol/week: 0.0 standard drinks  . Drug use: No  . Sexual activity: Not on file  Lifestyle  . Physical activity    Days per week: Not on file    Minutes per session: Not on file  . Stress: Not on file  Relationships  . Social Herbalist on phone: Not on file    Gets together: Not on file    Attends religious service: Not on file    Active member of club or organization: Not on file    Attends meetings of clubs or organizations: Not on file    Relationship status: Not on file  . Intimate partner violence  Fear of current or ex partner: Not on file    Emotionally abused: Not on file    Physically abused: Not on file    Forced sexual activity: Not on file  Other Topics Concern  . Not on file  Social History Narrative  . Not on file     PHYSICAL EXAM:  VS: There were no vitals taken for this visit. Physical Exam Gen: NAD, alert, cooperative with exam, well-appearing ENT: normal lips, normal nasal mucosa,  Eye: normal EOM, normal conjunctiva and lids CV:  no edema, +2 pedal pulses   Resp: no accessory muscle use, non-labored,  Skin: no rashes, no areas of induration  Neuro: normal tone, normal sensation to touch Psych:  normal insight, alert and oriented MSK:  Right knee: Mild effusion. Tenderness palpation of the medial lateral joint line. Positive anterior drawer. Negative posterior  drawer. Positive Murray's test. Neurovascular intact     ASSESSMENT & PLAN:   Acute pain of right knee Suspect an ACL tear given hemarthrosis and anterior drawer being positive today. -Referral to physical therapy. -An ACL brace due to thigh to calf ratio. -Provided temporary handicap placard. -Follow-up in 4-6 weeks.

## 2019-04-02 NOTE — Patient Instructions (Signed)
Good to see you The [employer] rep will call you about the brace Physical therapy will call to schedule an appointment.  Please continue ice and elevation  Please send me a message in MyChart with any questions or updates.  Please see me back in 4-6 weeks.   --Dr. Raeford Razor

## 2019-04-02 NOTE — Assessment & Plan Note (Signed)
Suspect an ACL tear given hemarthrosis and anterior drawer being positive today. -Referral to physical therapy. -An ACL brace due to thigh to calf ratio. -Provided temporary handicap placard. -Follow-up in 4-6 weeks.

## 2019-04-08 ENCOUNTER — Ambulatory Visit: Payer: Medicare Other | Admitting: Physical Therapy

## 2019-04-13 ENCOUNTER — Ambulatory Visit: Payer: Medicare Other | Attending: Family Medicine | Admitting: Physical Therapy

## 2019-04-13 ENCOUNTER — Encounter: Payer: Self-pay | Admitting: Physical Therapy

## 2019-04-13 ENCOUNTER — Other Ambulatory Visit: Payer: Self-pay

## 2019-04-13 DIAGNOSIS — M6281 Muscle weakness (generalized): Secondary | ICD-10-CM | POA: Diagnosis not present

## 2019-04-13 DIAGNOSIS — R262 Difficulty in walking, not elsewhere classified: Secondary | ICD-10-CM | POA: Insufficient documentation

## 2019-04-13 DIAGNOSIS — M25561 Pain in right knee: Secondary | ICD-10-CM | POA: Diagnosis not present

## 2019-04-13 DIAGNOSIS — M25661 Stiffness of right knee, not elsewhere classified: Secondary | ICD-10-CM | POA: Insufficient documentation

## 2019-04-13 NOTE — Therapy (Addendum)
[facility] [address]  [place], [place], [address] Phone: [phone]   Fax:  [phone]  Physical Therapy Evaluation  Patient Details  Name: [name] MRN: [id number] Date of Birth: Feb 12, 1946 Referring Provider (PT): Clearance Coots, MD    Progress Note Reporting Period 04/13/19 to 04/13/19  See note below for Objective Data and Assessment of Progress/Goals.     Encounter Date: 04/13/2019  PT End of Session - 04/13/19 1816    Visit Number  1    Number of Visits  17    Date for PT Re-Evaluation  06/08/19    Authorization Type  [employer]    PT Start Time  1450    PT Stop Time  1525    PT Time Calculation (min)  35 min    Equipment Utilized During Treatment  Other (comment)   hinged R knee brace   Activity Tolerance  Patient tolerated treatment well;Patient limited by pain    Behavior During Therapy  WFL for tasks assessed/performed       Past Medical History:  Diagnosis Date  . Anemia   . Crohn's colitis ([place])   . Diabetes mellitus    age 89's  . GERD (gastroesophageal reflux disease)   . Hypercholesterolemia   . Hypertension   . Osteopenia   . Osteopenia   . SBO (small bowel obstruction) ([place]) 09/13/15  . TIA (transient ischemic attack)     Past Surgical History:  Procedure Laterality Date  . COLON SURGERY  07/08/2007  . HERNIA REPAIR  11/21/2009  . ILEOSTOMY  02/26/2008   reversal  . TEE WITHOUT CARDIOVERSION N/A 11/10/2015   Procedure: TRANSESOPHAGEAL ECHOCARDIOGRAM (TEE);  Surgeon: Sanda Klein, MD;  Location: [facility] ENDOSCOPY;  Service: Cardiovascular;  Laterality: N/A;    There were no vitals filed for this visit.   Subjective Assessment - 04/13/19 1454    Subjective  Patient reports R knee has been hurting for months. Then, on 03/16/19 she slid on water in her kitchen, which sent her to the ER. Had blood aspirated from the knee and was given a knee brace. Had been using RW  for the first week or so, not using anything now. Having lots of pain in the inferior knee when she gets up from a chair and feels unsteady with her gait. Also has pain with knee extension. Advised to wear knee brace when she is out of bed.    Pertinent History  TIA, osteopenia, HTN, HLD, GERD, DM, Crohn's, anema    Limitations  Sitting;Lifting;Standing;Walking;House hold activities    How long can you sit comfortably?  unlimited    How long can you stand comfortably?  30-60 min    How long can you walk comfortably?  30-60 min    Diagnostic tests  03/17/19 R knee xray: Tricompartmental degenerative changes and large right knee joint effusion. No acute osseous abnormality.    Patient Stated Goals  get rid of pain    Currently in Pain?  Yes    Pain Score  3     Pain Location  Knee    Pain Orientation  Right    Pain Descriptors / Indicators  Aching    Pain Type  Acute pain         [facility] PT Assessment - 04/13/19 1458      Assessment   Medical Diagnosis  Acute pain of R knee    Referring Provider (PT)  Clearance Coots, MD  Onset Date/Surgical Date  03/16/19    Next MD Visit  04/30/19    Prior Therapy  no      Precautions   Precautions  --   osteopenia; R knee brace when up     Balance Screen   Has the patient fallen in the past 6 months  No    Has the patient had a decrease in activity level because of a fear of falling?   Yes    Is the patient reluctant to leave their home because of a fear of falling?   No      Home Environment   Living Environment  Private residence    Living Arrangements  Alone    Available Help at Discharge  Family    Type of [place] to enter    Entrance Stairs-Number of Steps  1    [place]  One level    [place] - 2 wheels;Crutches      Prior Function   Level of Independence  Independent    Vocation  Part time employment    Engineer, site of medical records     Leisure  play with grandkids       Cognition   Overall Cognitive Status  Within Functional Limits for tasks assessed      Observation/Other Assessments   Observations  diffuse R knee edema      Sensation   Light Touch  Appears Intact      Coordination   Gross Motor Movements are Fluid and Coordinated  Yes      Posture/Postural Control   Posture/Postural Control  Postural limitations    Postural Limitations  Rounded Shoulders;Forward head      ROM / Strength   AROM / PROM / Strength  AROM;PROM;Strength      AROM   AROM Assessment Site  Knee    Right/Left Knee  Right;Left    Right Knee Extension  6   pain   Right Knee Flexion  100   pain   Left Knee Extension  2    Left Knee Flexion  132      PROM   PROM Assessment Site  Knee    Right/Left Knee  Right;Left    Right Knee Extension  3   pain   Right Knee Flexion  115   pain   Left Knee Extension  0    Left Knee Flexion  139      Strength   Strength Assessment Site  Hip;Knee;Ankle    Right/Left Hip  Right;Left    Right Hip Flexion  3+/5    Right Hip ABduction  4/5    Right Hip ADduction  4/5    Left Hip Flexion  4+/5    Left Hip ABduction  4/5    Left Hip ADduction  4/5    Right/Left Knee  Right;Left    Right Knee Flexion  3/5   pressure not applied   Right Knee Extension  3/5   pressure not applied   Left Knee Flexion  4+/5    Left Knee Extension  4/5    Right/Left Ankle  Right;Left    Right Ankle Dorsiflexion  4/5    Right Ankle Plantar Flexion  4/5    Left Ankle Dorsiflexion  4/5    Left Ankle Plantar Flexion  4/5      Flexibility  Soft Tissue Assessment /Muscle Length  yes    Hamstrings  R HS severely tight      Palpation   Patella mobility  R hypomobility M/L and pain superior/inferior    Palpation comment  tenderness to R medial and lateral aspects of knee      Ambulation/Gait   Gait Pattern  Step-through pattern;Decreased hip/knee flexion - right;Decreased stance time - right;Decreased  step length - left;Decreased weight shift to right;Antalgic   severely antalgic   Ambulation Surface  Level;Indoor    Gait velocity  decreased                Objective measurements completed on examination: See above findings.              PT Education - 04/13/19 1527    Education Details  prognosis, POC, HEP, edu and demonstration on brace donning    Person(s) Educated  Patient    Methods  Explanation;Demonstration;Tactile cues;Verbal cues;Handout    Comprehension  Verbalized understanding;Returned demonstration       PT Short Term Goals - 04/13/19 1826      PT SHORT TERM GOAL #1   Title  Patient to be independent with intial HEP.    Time  4    Period  Weeks    Status  New    Target Date  05/11/19        PT Long Term Goals - 04/13/19 1826      PT LONG TERM GOAL #1   Title  Patient to be independent with advanced HEP.    Time  8    Period  Weeks    Status  New    Target Date  06/08/19      PT LONG TERM GOAL #2   Title  Patient to demonstrate R knee AROM/PROM symmetrical to opposite LE and nonpainful.    Time  8    Period  Weeks    Status  New    Target Date  06/08/19      PT LONG TERM GOAL #3   Title  Patient to demonstrate B LE strength >=4+/5.    Time  8    Period  Weeks    Status  New    Target Date  06/08/19      PT LONG TERM GOAL #4   Title  Patient to demonstrate symmetrical weight shift, knee flexion, and step length with ambulation with LRAD.    Time  8    Period  Weeks    Status  New    Target Date  06/08/19      PT LONG TERM GOAL #5   Title  Patient to report 75% improvement in pain levels in R knee with STS.    Time  8    Period  Weeks    Status  New    Target Date  06/08/19             Plan - 04/13/19 1818    Clinical Impression Statement  Patient is a 73y/o F presenting to OPPT with c/o R knee pain after slipping on water in her kitchen on 03/16/19. Given + anterior drawer and hemarthrosis, MD suspects ACL  tear. Patient reporting inferior knee pain when standing up from sitting and with knee extension. Patient today ambulating without hinged knee brace and without AD. Demonstrating decreased LE strength, decreased and painful R knee ROM, diffuse edema, hypomobility to M/L patellar mobilizations, and tenderness over medial and lateral aspects  of knee. Educated patient on gentle stretching and ROM HEP- patient reported understanding. Would benefit from skilled PT services 2x/week for 8 weeks to address aforementioned impairments.    Personal Factors and Comorbidities  Age;Sex;Comorbidity 3+;Time since onset of injury/illness/exacerbation;Profession;Past/Current Experience;Fitness    Comorbidities  TIA, osteopenia, HTN, HLD, GERD, DM, Crohn's, anema    Examination-Activity Limitations  Sit;Sleep;Bend;Squat;Stairs;Caring for Others;Carry;Stand;Transfers;Lift;Locomotion Level    Examination-Participation Restrictions  Church;School;Cleaning;Shop;Community Activity;Driving;Yard Work;Interpersonal Relationship;Laundry;Meal Prep    Stability/Clinical Decision Making  Stable/Uncomplicated    Clinical Decision Making  Low    Rehab Potential  Good    PT Frequency  2x / week    PT Duration  8 weeks    PT Treatment/Interventions  ADLs/Self Care Home Management;Cryotherapy;Electrical Stimulation;Iontophoresis 30m/ml Dexamethasone;Moist Heat;Balance training;Therapeutic exercise;Therapeutic activities;Functional mobility training;Stair training;Gait training;Ultrasound;Neuromuscular re-education;Patient/family education;Manual techniques;Vasopneumatic Device;Taping;Energy conservation;Dry needling;Passive range of motion    PT Next Visit Plan  reassess HEP; FOTO    Consulted and Agree with Plan of Care  Patient       Patient will benefit from skilled therapeutic intervention in order to improve the following deficits and impairments:  Abnormal gait, Hypomobility, Increased edema, Decreased activity tolerance,  Decreased strength, Pain, Difficulty walking, Decreased balance, Decreased range of motion, Improper body mechanics, Postural dysfunction, Impaired flexibility  Visit Diagnosis: 1. Acute pain of right knee   2. Knee stiff, right   3. Muscle weakness (generalized)   4. Difficulty in walking, not elsewhere classified        Problem List Patient Active Problem List   Diagnosis Date Noted  . Acute pain of right knee 03/20/2019  . Small bowel obstruction ([place] 09/14/2015  . Bowel obstruction ([place] 09/13/2015  . Hyperlipidemia 08/22/2015  . Cerebrovascular accident (CVA) due to embolism of left middle cerebral artery ([place] 08/11/2015  . Right-sided extracranial carotid artery stenosis 08/11/2015  . Type 2 diabetes mellitus ([facility] [place] 08/11/2015  . Accelerated hypertension 08/11/2015  . Tobacco abuse 08/11/2015    YJanene Harvey PT, DPT 04/13/19 6:31 PM   [facility] [place] [address]Phone: [phone]  Fax:  [phone] Name: JWANONA STAREMRN: [id number] of Birth: 1Dec 01, 1947   PHYSICAL THERAPY DISCHARGE SUMMARY  Visits from Start of Care: 1  Current functional level related to goals / functional outcomes: Unable to assess; patient did not return after initial eval   Remaining deficits: Unable to assess   Education / Equipment: HEP  Plan: Patient agrees to discharge.  Patient goals were not met. Patient is being discharged due to not returning since the last visit.  ?????     YJanene Harvey PT, DPT 05/14/19 11:52 AM

## 2019-04-14 DIAGNOSIS — E7800 Pure hypercholesterolemia, unspecified: Secondary | ICD-10-CM | POA: Diagnosis not present

## 2019-04-14 DIAGNOSIS — I10 Essential (primary) hypertension: Secondary | ICD-10-CM | POA: Diagnosis not present

## 2019-04-14 DIAGNOSIS — J449 Chronic obstructive pulmonary disease, unspecified: Secondary | ICD-10-CM | POA: Diagnosis not present

## 2019-04-14 DIAGNOSIS — E1065 Type 1 diabetes mellitus with hyperglycemia: Secondary | ICD-10-CM | POA: Diagnosis not present

## 2019-04-16 DIAGNOSIS — S83511A Sprain of anterior cruciate ligament of right knee, initial encounter: Secondary | ICD-10-CM | POA: Diagnosis not present

## 2019-04-30 ENCOUNTER — Ambulatory Visit: Payer: Medicare Other | Admitting: Family Medicine

## 2019-04-30 ENCOUNTER — Other Ambulatory Visit: Payer: Self-pay

## 2019-04-30 ENCOUNTER — Encounter: Payer: Self-pay | Admitting: Family Medicine

## 2019-04-30 DIAGNOSIS — M25561 Pain in right knee: Secondary | ICD-10-CM

## 2019-04-30 NOTE — Progress Notes (Signed)
[name] - 73 y.o. female MRN [id number]  Date of birth: 01-23-1946  SUBJECTIVE:  Including CC & ROS.  Chief Complaint  Patient presents with  . Follow-up    follow up for right knee    [name] is a 73 y.o. female that is following up for her right knee pain.  She is having slow improvement.  She has been doing physical therapy at her work.  She received her ACL brace but is unsure of how to put it on.  Denies any significant mechanical symptoms.  Does feel a twinge of pain intermittently.  The pain occurs when she is walking and then relieves after short period of time.   Review of Systems  Constitutional: Negative for fever.  HENT: Negative for congestion.   Respiratory: Negative for cough.   Cardiovascular: Negative for chest pain.  Gastrointestinal: Negative for abdominal pain.  Musculoskeletal: Positive for arthralgias and gait problem.  Neurological: Negative for weakness.  Hematological: Negative for adenopathy.    HISTORY: Past Medical, Surgical, Social, and Family History Reviewed & Updated per EMR.   Pertinent Historical Findings include:  Past Medical History:  Diagnosis Date  . Anemia   . Crohn's colitis ([place])   . Diabetes mellitus    age 25's  . GERD (gastroesophageal reflux disease)   . Hypercholesterolemia   . Hypertension   . Osteopenia   . Osteopenia   . SBO (small bowel obstruction) ([place]) 09/13/15  . TIA (transient ischemic attack)     Past Surgical History:  Procedure Laterality Date  . COLON SURGERY  07/08/2007  . HERNIA REPAIR  11/21/2009  . ILEOSTOMY  02/26/2008   reversal  . TEE WITHOUT CARDIOVERSION N/A 11/10/2015   Procedure: TRANSESOPHAGEAL ECHOCARDIOGRAM (TEE);  Surgeon: Sanda Klein, MD;  Location: [facility] ENDOSCOPY;  Service: Cardiovascular;  Laterality: N/A;    Allergies  Allergen Reactions  . Simvastatin Other (See Comments)    Muscle cramps    Family History  Problem Relation Age of Onset  . Stroke Mother   . Heart  attack Father   . Hypertension Daughter   . Diabetes Brother      Social History   Socioeconomic History  . Marital status: Married    Spouse name: Not on file  . Number of children: Not on file  . Years of education: Not on file  . Highest education level: Not on file  Occupational History  . Not on file  Social Needs  . Financial resource strain: Not on file  . Food insecurity    Worry: Not on file    Inability: Not on file  . Transportation needs    Medical: Not on file    Non-medical: Not on file  Tobacco Use  . Smoking status: Current Every Day Smoker    Types: Cigarettes  . Smokeless tobacco: Never Used  . Tobacco comment: 09/26/15 5 cigs daily  Substance and Sexual Activity  . Alcohol use: No    Alcohol/week: 0.0 standard drinks  . Drug use: No  . Sexual activity: Not on file  Lifestyle  . Physical activity    Days per week: Not on file    Minutes per session: Not on file  . Stress: Not on file  Relationships  . Social Herbalist on phone: Not on file    Gets together: Not on file    Attends religious service: Not on file    Active member of club or organization: Not  on file    Attends meetings of clubs or organizations: Not on file    Relationship status: Not on file  . Intimate partner violence    Fear of current or ex partner: Not on file    Emotionally abused: Not on file    Physically abused: Not on file    Forced sexual activity: Not on file  Other Topics Concern  . Not on file  Social History Narrative  . Not on file     PHYSICAL EXAM:  VS: BP 130/70   Ht 5\' 3"  (1.6 m)   Wt 155 lb (70.3 kg)   BMI 27.46 kg/m  Physical Exam Gen: NAD, alert, cooperative with exam, well-appearing ENT: normal lips, normal nasal mucosa,  Eye: normal EOM, normal conjunctiva and lids CV:  no edema, +2 pedal pulses   Resp: no accessory muscle use, non-labored,  Skin: no rashes, no areas of induration  Neuro: normal tone, normal sensation to touch  Psych:  normal insight, alert and oriented MSK:  Right knee:  Mild effusion  Instability with valgus and varus stress testing  Normal strength to resistance  NVI     ASSESSMENT & PLAN:   Acute pain of right knee Continues to get improvement with therapy.  - counseled on HEP and supportive care - counseled on knee brace - can follow up in 2-3 months.

## 2019-04-30 NOTE — Assessment & Plan Note (Signed)
Continues to get improvement with therapy.  - counseled on HEP and supportive care - counseled on knee brace - can follow up in 2-3 months.

## 2019-04-30 NOTE — Patient Instructions (Signed)
Good to see you Please continue ice as needed  Please continue with physical therapy   Please send me a message in MyChart with any questions or updates.  Please see me back in 2-3 months.   --Dr. Raeford Razor

## 2019-07-17 DIAGNOSIS — I10 Essential (primary) hypertension: Secondary | ICD-10-CM | POA: Diagnosis not present

## 2019-07-17 DIAGNOSIS — E7800 Pure hypercholesterolemia, unspecified: Secondary | ICD-10-CM | POA: Diagnosis not present

## 2019-07-17 DIAGNOSIS — E1065 Type 1 diabetes mellitus with hyperglycemia: Secondary | ICD-10-CM | POA: Diagnosis not present

## 2019-07-17 DIAGNOSIS — E02 Subclinical iodine-deficiency hypothyroidism: Secondary | ICD-10-CM | POA: Diagnosis not present

## 2019-08-04 DIAGNOSIS — Z03818 Encounter for observation for suspected exposure to other biological agents ruled out: Secondary | ICD-10-CM | POA: Diagnosis not present

## 2019-10-20 DIAGNOSIS — Z0000 Encounter for general adult medical examination without abnormal findings: Secondary | ICD-10-CM | POA: Diagnosis not present

## 2019-10-20 DIAGNOSIS — E1065 Type 1 diabetes mellitus with hyperglycemia: Secondary | ICD-10-CM | POA: Diagnosis not present

## 2019-10-20 DIAGNOSIS — I5032 Chronic diastolic (congestive) heart failure: Secondary | ICD-10-CM | POA: Diagnosis not present

## 2019-10-20 DIAGNOSIS — E7800 Pure hypercholesterolemia, unspecified: Secondary | ICD-10-CM | POA: Diagnosis not present

## 2019-10-20 DIAGNOSIS — I10 Essential (primary) hypertension: Secondary | ICD-10-CM | POA: Diagnosis not present

## 2019-11-13 DIAGNOSIS — E1065 Type 1 diabetes mellitus with hyperglycemia: Secondary | ICD-10-CM | POA: Diagnosis not present

## 2019-11-13 DIAGNOSIS — I10 Essential (primary) hypertension: Secondary | ICD-10-CM | POA: Diagnosis not present

## 2019-11-13 DIAGNOSIS — E7800 Pure hypercholesterolemia, unspecified: Secondary | ICD-10-CM | POA: Diagnosis not present

## 2019-11-13 DIAGNOSIS — E02 Subclinical iodine-deficiency hypothyroidism: Secondary | ICD-10-CM | POA: Diagnosis not present

## 2019-11-17 DIAGNOSIS — Z0000 Encounter for general adult medical examination without abnormal findings: Secondary | ICD-10-CM | POA: Diagnosis not present

## 2019-11-17 DIAGNOSIS — E1065 Type 1 diabetes mellitus with hyperglycemia: Secondary | ICD-10-CM | POA: Diagnosis not present

## 2019-11-17 DIAGNOSIS — E1069 Type 1 diabetes mellitus with other specified complication: Secondary | ICD-10-CM | POA: Diagnosis not present

## 2019-11-17 DIAGNOSIS — E1021 Type 1 diabetes mellitus with diabetic nephropathy: Secondary | ICD-10-CM | POA: Diagnosis not present

## 2019-12-09 ENCOUNTER — Encounter: Payer: Self-pay | Admitting: General Practice

## 2020-03-11 DIAGNOSIS — E02 Subclinical iodine-deficiency hypothyroidism: Secondary | ICD-10-CM | POA: Diagnosis not present

## 2020-03-11 DIAGNOSIS — E1065 Type 1 diabetes mellitus with hyperglycemia: Secondary | ICD-10-CM | POA: Diagnosis not present

## 2020-03-11 DIAGNOSIS — I10 Essential (primary) hypertension: Secondary | ICD-10-CM | POA: Diagnosis not present

## 2020-03-11 DIAGNOSIS — E7800 Pure hypercholesterolemia, unspecified: Secondary | ICD-10-CM | POA: Diagnosis not present

## 2020-03-15 DIAGNOSIS — I10 Essential (primary) hypertension: Secondary | ICD-10-CM | POA: Diagnosis not present

## 2020-03-15 DIAGNOSIS — N1831 Chronic kidney disease, stage 3a: Secondary | ICD-10-CM | POA: Diagnosis not present

## 2020-03-15 DIAGNOSIS — J449 Chronic obstructive pulmonary disease, unspecified: Secondary | ICD-10-CM | POA: Diagnosis not present

## 2020-03-22 DIAGNOSIS — E1065 Type 1 diabetes mellitus with hyperglycemia: Secondary | ICD-10-CM | POA: Diagnosis not present

## 2020-03-22 DIAGNOSIS — I5032 Chronic diastolic (congestive) heart failure: Secondary | ICD-10-CM | POA: Diagnosis not present

## 2020-03-22 DIAGNOSIS — J441 Chronic obstructive pulmonary disease with (acute) exacerbation: Secondary | ICD-10-CM | POA: Diagnosis not present

## 2020-03-22 DIAGNOSIS — J449 Chronic obstructive pulmonary disease, unspecified: Secondary | ICD-10-CM | POA: Diagnosis not present

## 2020-07-11 DIAGNOSIS — E02 Subclinical iodine-deficiency hypothyroidism: Secondary | ICD-10-CM | POA: Diagnosis not present

## 2020-07-11 DIAGNOSIS — I10 Essential (primary) hypertension: Secondary | ICD-10-CM | POA: Diagnosis not present

## 2020-07-11 DIAGNOSIS — E1065 Type 1 diabetes mellitus with hyperglycemia: Secondary | ICD-10-CM | POA: Diagnosis not present

## 2020-07-11 DIAGNOSIS — E7800 Pure hypercholesterolemia, unspecified: Secondary | ICD-10-CM | POA: Diagnosis not present

## 2020-09-08 DIAGNOSIS — I10 Essential (primary) hypertension: Secondary | ICD-10-CM | POA: Diagnosis not present

## 2020-09-08 DIAGNOSIS — E1065 Type 1 diabetes mellitus with hyperglycemia: Secondary | ICD-10-CM | POA: Diagnosis not present

## 2020-09-08 DIAGNOSIS — J449 Chronic obstructive pulmonary disease, unspecified: Secondary | ICD-10-CM | POA: Diagnosis not present

## 2020-09-08 DIAGNOSIS — E7800 Pure hypercholesterolemia, unspecified: Secondary | ICD-10-CM | POA: Diagnosis not present

## 2020-09-29 ENCOUNTER — Inpatient Hospital Stay (HOSPITAL_COMMUNITY)
Admission: EM | Admit: 2020-09-29 | Discharge: 2020-10-05 | DRG: 481 | Disposition: A | Discharge status: Home Health | Payer: Medicare HMO | Attending: Internal Medicine | Admitting: Internal Medicine

## 2020-09-29 ENCOUNTER — Encounter (HOSPITAL_COMMUNITY): Payer: Self-pay | Admitting: Emergency Medicine

## 2020-09-29 ENCOUNTER — Emergency Department (HOSPITAL_COMMUNITY): Payer: Medicare HMO

## 2020-09-29 DIAGNOSIS — I10 Essential (primary) hypertension: Secondary | ICD-10-CM

## 2020-09-29 DIAGNOSIS — K5010 Crohn's disease of large intestine without complications: Secondary | ICD-10-CM | POA: Diagnosis not present

## 2020-09-29 DIAGNOSIS — Z833 Family history of diabetes mellitus: Secondary | ICD-10-CM

## 2020-09-29 DIAGNOSIS — Z8249 Family history of ischemic heart disease and other diseases of the circulatory system: Secondary | ICD-10-CM | POA: Diagnosis not present

## 2020-09-29 DIAGNOSIS — Z794 Long term (current) use of insulin: Secondary | ICD-10-CM

## 2020-09-29 DIAGNOSIS — Z20822 Contact with and (suspected) exposure to covid-19: Secondary | ICD-10-CM | POA: Diagnosis not present

## 2020-09-29 DIAGNOSIS — S72142A Displaced intertrochanteric fracture of left femur, initial encounter for closed fracture: Secondary | ICD-10-CM | POA: Diagnosis not present

## 2020-09-29 DIAGNOSIS — T1490XA Injury, unspecified, initial encounter: Secondary | ICD-10-CM

## 2020-09-29 DIAGNOSIS — S72145A Nondisplaced intertrochanteric fracture of left femur, initial encounter for closed fracture: Principal | ICD-10-CM | POA: Diagnosis present

## 2020-09-29 DIAGNOSIS — E876 Hypokalemia: Secondary | ICD-10-CM | POA: Diagnosis present

## 2020-09-29 DIAGNOSIS — W19XXXA Unspecified fall, initial encounter: Secondary | ICD-10-CM | POA: Diagnosis not present

## 2020-09-29 DIAGNOSIS — Z7982 Long term (current) use of aspirin: Secondary | ICD-10-CM

## 2020-09-29 DIAGNOSIS — Z79899 Other long term (current) drug therapy: Secondary | ICD-10-CM | POA: Diagnosis not present

## 2020-09-29 DIAGNOSIS — E1165 Type 2 diabetes mellitus with hyperglycemia: Secondary | ICD-10-CM | POA: Diagnosis present

## 2020-09-29 DIAGNOSIS — F17210 Nicotine dependence, cigarettes, uncomplicated: Secondary | ICD-10-CM | POA: Diagnosis present

## 2020-09-29 DIAGNOSIS — Z043 Encounter for examination and observation following other accident: Secondary | ICD-10-CM | POA: Diagnosis not present

## 2020-09-29 DIAGNOSIS — Z823 Family history of stroke: Secondary | ICD-10-CM

## 2020-09-29 DIAGNOSIS — K219 Gastro-esophageal reflux disease without esophagitis: Secondary | ICD-10-CM | POA: Diagnosis present

## 2020-09-29 DIAGNOSIS — S72002A Fracture of unspecified part of neck of left femur, initial encounter for closed fracture: Secondary | ICD-10-CM

## 2020-09-29 DIAGNOSIS — Y92009 Unspecified place in unspecified non-institutional (private) residence as the place of occurrence of the external cause: Secondary | ICD-10-CM | POA: Diagnosis not present

## 2020-09-29 DIAGNOSIS — Z79890 Hormone replacement therapy (postmenopausal): Secondary | ICD-10-CM

## 2020-09-29 DIAGNOSIS — E119 Type 2 diabetes mellitus without complications: Secondary | ICD-10-CM

## 2020-09-29 DIAGNOSIS — E1169 Type 2 diabetes mellitus with other specified complication: Secondary | ICD-10-CM

## 2020-09-29 DIAGNOSIS — D638 Anemia in other chronic diseases classified elsewhere: Secondary | ICD-10-CM | POA: Diagnosis present

## 2020-09-29 DIAGNOSIS — D62 Acute posthemorrhagic anemia: Secondary | ICD-10-CM | POA: Diagnosis not present

## 2020-09-29 DIAGNOSIS — J449 Chronic obstructive pulmonary disease, unspecified: Secondary | ICD-10-CM | POA: Diagnosis present

## 2020-09-29 DIAGNOSIS — Z888 Allergy status to other drugs, medicaments and biological substances status: Secondary | ICD-10-CM

## 2020-09-29 DIAGNOSIS — Z8673 Personal history of transient ischemic attack (TIA), and cerebral infarction without residual deficits: Secondary | ICD-10-CM

## 2020-09-29 DIAGNOSIS — R000 Tachycardia, unspecified: Secondary | ICD-10-CM | POA: Diagnosis not present

## 2020-09-29 DIAGNOSIS — E7800 Pure hypercholesterolemia, unspecified: Secondary | ICD-10-CM | POA: Diagnosis present

## 2020-09-29 DIAGNOSIS — W010XXA Fall on same level from slipping, tripping and stumbling without subsequent striking against object, initial encounter: Secondary | ICD-10-CM | POA: Diagnosis present

## 2020-09-29 DIAGNOSIS — S72142D Displaced intertrochanteric fracture of left femur, subsequent encounter for closed fracture with routine healing: Secondary | ICD-10-CM | POA: Diagnosis not present

## 2020-09-29 DIAGNOSIS — R531 Weakness: Secondary | ICD-10-CM | POA: Diagnosis not present

## 2020-09-29 DIAGNOSIS — M8580 Other specified disorders of bone density and structure, unspecified site: Secondary | ICD-10-CM | POA: Diagnosis present

## 2020-09-29 DIAGNOSIS — K56609 Unspecified intestinal obstruction, unspecified as to partial versus complete obstruction: Secondary | ICD-10-CM | POA: Diagnosis not present

## 2020-09-29 DIAGNOSIS — Z7951 Long term (current) use of inhaled steroids: Secondary | ICD-10-CM | POA: Diagnosis not present

## 2020-09-29 LAB — CBC
HCT: 41.4 % (ref 36.0–46.0)
Hemoglobin: 12.4 g/dL (ref 12.0–15.0)
MCH: 26.1 pg (ref 26.0–34.0)
MCHC: 30 g/dL (ref 30.0–36.0)
MCV: 87.2 fL (ref 80.0–100.0)
Platelets: 336 10*3/uL (ref 150–400)
RBC: 4.75 MIL/uL (ref 3.87–5.11)
RDW: 21.8 % — ABNORMAL HIGH (ref 11.5–15.5)
WBC: 9.2 10*3/uL (ref 4.0–10.5)
nRBC: 0 % (ref 0.0–0.2)

## 2020-09-29 LAB — BASIC METABOLIC PANEL
Anion gap: 13 (ref 5–15)
BUN: 22 mg/dL (ref 8–23)
CO2: 26 mmol/L (ref 22–32)
Calcium: 9.7 mg/dL (ref 8.9–10.3)
Chloride: 100 mmol/L (ref 98–111)
Creatinine, Ser: 0.98 mg/dL (ref 0.44–1.00)
GFR, Estimated: >60 mL/min (ref >60)
Glucose, Bld: 204 mg/dL — ABNORMAL HIGH (ref 70–99)
Potassium: 3.9 mmol/L (ref 3.5–5.1)
Sodium: 139 mmol/L (ref 135–145)

## 2020-09-29 LAB — TYPE AND SCREEN
ABO/RH(D): A POS
Antibody Screen: NEGATIVE

## 2020-09-29 LAB — CBG MONITORING, ED: Glucose-Capillary: 155 mg/dL — ABNORMAL HIGH (ref 70–99)

## 2020-09-29 MED ORDER — HYDROMORPHONE HCL 1 MG/ML IJ SOLN
0.5000 mg | Freq: Once | INTRAMUSCULAR | Status: AC
  Administered 2020-09-29: 0.5 mg via INTRAVENOUS
  Filled 2020-09-29: qty 1

## 2020-09-29 MED ORDER — AMLODIPINE BESYLATE 5 MG PO TABS
5.0000 mg | ORAL_TABLET | Freq: Every day | ORAL | Status: DC
  Administered 2020-09-30 – 2020-10-05 (×6): 5 mg via ORAL
  Filled 2020-09-29 (×6): qty 1

## 2020-09-29 MED ORDER — INSULIN GLARGINE 100 UNIT/ML ~~LOC~~ SOLN
10.0000 [IU] | Freq: Once | SUBCUTANEOUS | Status: AC
  Administered 2020-09-30: 10 [IU] via SUBCUTANEOUS
  Filled 2020-09-29: qty 0.1

## 2020-09-29 MED ORDER — LEVOTHYROXINE SODIUM 50 MCG PO TABS
50.0000 ug | ORAL_TABLET | Freq: Every day | ORAL | Status: DC
  Administered 2020-09-30 – 2020-10-05 (×6): 50 ug via ORAL
  Filled 2020-09-29 (×6): qty 1

## 2020-09-29 MED ORDER — MORPHINE SULFATE (PF) 2 MG/ML IV SOLN
0.5000 mg | INTRAVENOUS | Status: DC | PRN
  Administered 2020-09-30: 0.5 mg via INTRAVENOUS
  Filled 2020-09-29: qty 1

## 2020-09-29 MED ORDER — SODIUM CHLORIDE 0.9 % IV BOLUS
500.0000 mL | Freq: Once | INTRAVENOUS | Status: AC
  Administered 2020-09-29: 500 mL via INTRAVENOUS

## 2020-09-29 MED ORDER — HYDROMORPHONE HCL 1 MG/ML IJ SOLN
1.0000 mg | Freq: Once | INTRAMUSCULAR | Status: AC
  Administered 2020-09-29: 1 mg via INTRAVENOUS
  Filled 2020-09-29: qty 1

## 2020-09-29 MED ORDER — UMECLIDINIUM-VILANTEROL 62.5-25 MCG/INH IN AEPB: 1.0000 | INHALATION_SPRAY | Freq: Every day | RESPIRATORY_TRACT | Status: DC | PRN

## 2020-09-29 MED ORDER — ALBUTEROL SULFATE HFA 108 (90 BASE) MCG/ACT IN AERS
2.0000 | INHALATION_SPRAY | Freq: Four times a day (QID) | RESPIRATORY_TRACT | Status: DC | PRN
  Filled 2020-09-29: qty 6.7

## 2020-09-29 MED ORDER — SODIUM CHLORIDE 0.9 % IV BOLUS
1000.0000 mL | Freq: Once | INTRAVENOUS | Status: AC
  Administered 2020-09-29: 1000 mL via INTRAVENOUS

## 2020-09-29 MED ORDER — BUPROPION HCL ER (XL) 150 MG PO TB24
300.0000 mg | ORAL_TABLET | Freq: Every day | ORAL | Status: DC
  Administered 2020-09-30 – 2020-10-05 (×6): 300 mg via ORAL
  Filled 2020-09-29 (×6): qty 2

## 2020-09-29 MED ORDER — METHOCARBAMOL 500 MG PO TABS
500.0000 mg | ORAL_TABLET | Freq: Four times a day (QID) | ORAL | Status: DC | PRN
  Administered 2020-09-30 – 2020-10-05 (×6): 500 mg via ORAL
  Filled 2020-09-29 (×6): qty 1

## 2020-09-29 MED ORDER — INSULIN ASPART 100 UNIT/ML ~~LOC~~ SOLN
0.0000 [IU] | SUBCUTANEOUS | Status: DC
  Administered 2020-09-29: 2 [IU] via SUBCUTANEOUS
  Administered 2020-09-30: 3 [IU] via SUBCUTANEOUS
  Administered 2020-09-30: 1 [IU] via SUBCUTANEOUS
  Administered 2020-09-30 (×2): 2 [IU] via SUBCUTANEOUS
  Administered 2020-09-30 (×2): 3 [IU] via SUBCUTANEOUS
  Administered 2020-10-01: 2 [IU] via SUBCUTANEOUS
  Administered 2020-10-01: 5 [IU] via SUBCUTANEOUS
  Administered 2020-10-01: 3 [IU] via SUBCUTANEOUS
  Administered 2020-10-01 – 2020-10-02 (×3): 2 [IU] via SUBCUTANEOUS
  Administered 2020-10-02: 3 [IU] via SUBCUTANEOUS
  Administered 2020-10-02: 2 [IU] via SUBCUTANEOUS
  Administered 2020-10-02: 1 [IU] via SUBCUTANEOUS
  Administered 2020-10-02 – 2020-10-03 (×4): 2 [IU] via SUBCUTANEOUS
  Filled 2020-09-29: qty 1
  Filled 2020-09-29: qty 0.09

## 2020-09-29 MED ORDER — ROSUVASTATIN CALCIUM 20 MG PO TABS
40.0000 mg | ORAL_TABLET | Freq: Every day | ORAL | Status: DC
  Administered 2020-09-30 – 2020-10-05 (×6): 40 mg via ORAL
  Filled 2020-09-29 (×6): qty 2

## 2020-09-29 MED ORDER — METHOCARBAMOL 1000 MG/10ML IJ SOLN
500.0000 mg | Freq: Four times a day (QID) | INTRAVENOUS | Status: DC | PRN
  Filled 2020-09-29: qty 5

## 2020-09-29 MED ORDER — ONDANSETRON HCL 4 MG/2ML IJ SOLN
4.0000 mg | Freq: Once | INTRAMUSCULAR | Status: AC
  Administered 2020-09-29: 4 mg via INTRAVENOUS
  Filled 2020-09-29: qty 2

## 2020-09-29 NOTE — H&P (Signed)
History and Physical    ALLSION Oconnell OVZ:[id number] DOB: [birth year] DOA: 09/29/2020  PCP: Georgianne Fick, MD  Patient coming from: Home.  Chief Complaint: Fall.  HPI: [name] is a 75 y.o. female with history of hypertension, diabetes mellitus, COPD, TIA, Crohn's disease per the chart had a fall at home when patient was picking up groceries and coming back home.  Patient states she slipped on the mat and fell on the floor denies hitting head or losing consciousness.  ED Course: In the ER x-rays revealed left hip fracture and Dr. Yevette Oconnell orthopedic surgeon was consulted.  Requested transfer to [facility].  Labs are unremarkable.  EKG showed sinus tachycardia with LVH.  Covid test was negative.  Review of Systems: As per HPI, rest all negative.   Past Medical History:  Diagnosis Date  . Anemia   . Crohn's colitis ([facility])   . Diabetes mellitus    age 65's  . GERD (gastroesophageal reflux disease)   . Hypercholesterolemia   . Hypertension   . Osteopenia   . Osteopenia   . SBO (small bowel obstruction) ([facility]) 09/13/15  . TIA (transient ischemic attack)     Past Surgical History:  Procedure Laterality Date  . COLON SURGERY  07/08/2007  . HERNIA REPAIR  11/21/2009  . ILEOSTOMY  02/26/2008   reversal  . TEE WITHOUT CARDIOVERSION N/A 11/10/2015   Procedure: TRANSESOPHAGEAL ECHOCARDIOGRAM (TEE);  Surgeon: Thurmon Fair, MD;  Location: [facility] ENDOSCOPY;  Service: Cardiovascular;  Laterality: N/A;     reports that she has been smoking cigarettes. She has never used smokeless tobacco. She reports that she does not drink alcohol and does not use drugs.  Allergies  Allergen Reactions  . Simvastatin Other (See Comments)    Muscle cramps    Family History  Problem Relation Age of Onset  . Stroke Mother   . Heart attack Father   . Hypertension Daughter   . Diabetes Brother     Prior to Admission medications   Medication Sig Start Date End Date Taking?  Authorizing Provider  amLODipine (NORVASC) 5 MG tablet Take 5 mg by mouth daily.   Yes [provider]  aspirin 81 MG tablet Take 81 mg by mouth daily.   Yes [provider]  Biotin 5000 MCG TABS Take 5,000 mcg by mouth daily.    Yes [provider]  buPROPion (WELLBUTRIN XL) 300 MG 24 hr tablet Take 300 mg by mouth daily.    Yes [provider]  CALCIUM PO Take 1 tablet by mouth daily.   Yes [provider]  Cholecalciferol (VITAMIN D PO) Take 800 Units by mouth daily.    Yes [provider]  CINNAMON PO Take 500 mg by mouth daily.   Yes [provider]  fish oil-omega-3 fatty acids 1000 MG capsule Take 1 g by mouth daily.   Yes [provider]  insulin regular (NOVOLIN R,HUMULIN R) 100 units/mL injection Inject 5-11 Units into the skin 3 (three) times daily as needed for high blood sugar. Sliding Scale: If Blood Sugar is <150 Take 5 units, If Blood Sugar Between 150-200-Take 11 units   Yes [provider]  levothyroxine (SYNTHROID, LEVOTHROID) 50 MCG tablet Take 50 mcg by mouth daily. 04/13/18  Yes [provider]  NOVOLIN N 100 UNIT/ML injection Inject 18-28 Units into the skin in the morning, at noon, and at bedtime. Take 28 units in BID & Take 18 units at bedtime 09/13/20  Yes [provider]  rosuvastatin (CRESTOR) 40 MG tablet Take 40 mg by mouth daily. 07/25/20  Yes [provider]  vitamin E 100 UNIT capsule Take 100 Units by mouth daily.   Yes [provider]  ANORO ELLIPTA 62.5-25 MCG/INH AEPB Inhale 1 puff into the lungs daily as needed (sob/wheezing). 04/16/20   [provider]  colchicine 0.6 MG tablet Take 1 tablet (0.6 mg total) by mouth every 4 (four) hours as needed. May cause diarrhea Patient not taking: No sig reported 03/17/19   Tanda Rockers, PA-C  docusate sodium (COLACE) 100 MG capsule Take 1 capsule (100 mg total) by mouth every 12 (twelve)  hours. Patient not taking: No sig reported 06/23/18   Arthor Captain, PA-C  furosemide (LASIX) 20 MG tablet Take 20 mg by mouth daily as needed for fluid.    [provider]  HYDROcodone-acetaminophen (NORCO/VICODIN) 5-325 MG tablet Take 1 tablet by mouth every 4 (four) hours as needed. Patient not taking: Reported on 09/29/2020 06/22/18   Linwood Dibbles, MD  HYDROcodone-acetaminophen (NORCO/VICODIN) 5-325 MG tablet Take 2 tablets by mouth every 4 (four) hours as needed. Patient not taking: No sig reported 03/17/19   Tanda Rockers, PA-C  oxyCODONE-acetaminophen (PERCOCET) 5-325 MG tablet Take 1-2 tablets by mouth every 6 (six) hours as needed for severe pain. Patient not taking: No sig reported 06/23/18   Arthor Captain, PA-C  VENTOLIN HFA 108 (90 Base) MCG/ACT inhaler Inhale 2 puffs into the lungs every 6 (six) hours as needed for wheezing or shortness of breath. 05/18/20   [provider]    Physical Exam: Constitutional: Moderately built and nourished. Vitals:   09/29/20 1802 09/29/20 1900 09/29/20 1930 09/29/20 2000  BP:  (!) 148/89 (!) 169/84 137/77  Pulse:  (!) 104 (!) 104 (!) 106  Resp:  14 13 12   Temp:      TempSrc:      SpO2:  98% 96% 95%  Weight: 67.1 kg     Height: 5\' 4"  (1.626 m)      Eyes: Anicteric no pallor. ENMT: No discharge from the ears eyes nose and mouth. Neck: No mass felt.  No neck rigidity. Respiratory: No rhonchi or crepitations. Cardiovascular: S1-S2 heard. Abdomen: Soft nontender bowel sounds present. Musculoskeletal: Pain on moving left hip. Skin: No rash. Neurologic: Alert awake oriented to time place and person.  Moves all extremities. Psychiatric: Appears normal.  Normal affect.   Labs on Admission: I have personally reviewed following labs and imaging studies  CBC: Recent Labs  Lab 09/29/20 1850  WBC 9.2  HGB 12.4  HCT 41.4  MCV 87.2  PLT 336   Basic Metabolic Panel: Recent Labs  Lab 09/29/20 1850  NA 139  K 3.9  CL  100  CO2 26  GLUCOSE 204*  BUN 22  CREATININE 0.98  CALCIUM 9.7   GFR: Estimated Creatinine Clearance: 47.5 mL/min (by C-G formula based on SCr of 0.98 mg/dL). Liver Function Tests: No results for input(s): AST, ALT, ALKPHOS, BILITOT, PROT, ALBUMIN in the last 168 hours. No results for input(s): LIPASE, AMYLASE in the last 168 hours. No results for input(s): AMMONIA in the last 168 hours. Coagulation Profile: No results for input(s): INR, PROTIME in the last 168 hours. Cardiac Enzymes: No results for input(s): CKTOTAL, CKMB, CKMBINDEX, TROPONINI in the last 168 hours. BNP (last 3 results) No results for input(s): PROBNP in the last 8760 hours. HbA1C: No results for input(s): HGBA1C in the last 72 hours. CBG: No  results for input(s): GLUCAP in the last 168 hours. Lipid Profile: No results for input(s): CHOL, HDL, LDLCALC, TRIG, CHOLHDL, LDLDIRECT in the last 72 hours. Thyroid Function Tests: No results for input(s): TSH, T4TOTAL, FREET4, T3FREE, THYROIDAB in the last 72 hours. Anemia Panel: No results for input(s): VITAMINB12, FOLATE, FERRITIN, TIBC, IRON, RETICCTPCT in the last 72 hours. Urine analysis:    Component Value Date/Time   COLORURINE YELLOW 09/14/2015 0254   APPEARANCEUR CLEAR 09/14/2015 0254   LABSPEC 1.012 09/14/2015 0254   PHURINE 5.5 09/14/2015 0254   GLUCOSEU 100 (A) 09/14/2015 0254   HGBUR NEGATIVE 09/14/2015 0254   BILIRUBINUR NEGATIVE 09/14/2015 0254   KETONESUR NEGATIVE 09/14/2015 0254   PROTEINUR NEGATIVE 09/14/2015 0254   UROBILINOGEN 0.2 11/15/2009 0805   NITRITE NEGATIVE 09/14/2015 0254   LEUKOCYTESUR NEGATIVE 09/14/2015 0254   Sepsis Labs: @LABRCNTIP (procalcitonin:4,lacticidven:4) )No results found for this or any previous visit (from the past 240 hour(s)).   Radiological Exams on Admission: DG Chest 1 View  Result Date: 09/29/2020 CLINICAL DATA:  Patient status post fall. EXAM: CHEST  1 VIEW COMPARISON:  Chest radiograph 06/21/2015.  FINDINGS: Monitoring leads overlie the patient. Stable cardiac and mediastinal contours. No large area pulmonary consolidation. No pleural effusion or pneumothorax. IMPRESSION: No acute cardiopulmonary process. Electronically Signed   By: 06/23/2015 M.D.   On: 09/29/2020 18:45   DG HIP UNILAT W OR W/O PELVIS 2-3 VIEWS LEFT  Result Date: 09/29/2020 CLINICAL DATA:  10/01/2020, left leg pain EXAM: LEFT FEMUR 2 VIEWS; DG HIP (WITH OR WITHOUT PELVIS) 2-3V LEFT COMPARISON:  None. FINDINGS: Left hip: Frontal view of the pelvis as well as frontal and cross-table lateral views of the left hip are obtained. There is a minimally displaced intertrochanteric left hip fracture without significant angulation. The hips remain well aligned. Prominent degenerative changes lower lumbar spine. Left femur: Frontal and lateral views demonstrate intertrochanteric left hip fracture which is minimally displaced. No other acute bony abnormalities. 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 IMPRESSION: 1. Intertrochanteric left hip fracture. Fracture is minimally displaced without significant angulation. Electronically Signed   By: Larey Seat M.D.   On: 09/29/2020 18:47   DG Femur Min 2 Views Left  Result Date: 09/29/2020 CLINICAL DATA:  10/01/2020, left leg pain EXAM: LEFT FEMUR 2 VIEWS; DG HIP (WITH OR WITHOUT PELVIS) 2-3V LEFT COMPARISON:  None. FINDINGS: Left hip: Frontal view of the pelvis as well as frontal and cross-table lateral views of the left hip are obtained. There is a minimally displaced intertrochanteric left hip fracture without significant angulation. The hips remain well aligned. Prominent degenerative changes lower lumbar spine. Left femur: Frontal and lateral views demonstrate intertrochanteric left hip fracture which is minimally displaced. No other acute bony abnormalities. Mild osteoarthritis of the left hip, with moderate medial compartmental  osteoarthritis of the left knee. IMPRESSION: 1. Intertrochanteric left hip fracture. Fracture is minimally displaced without significant angulation. Electronically Signed   By: Larey Seat M.D.   On: 09/29/2020 18:47    EKG: Independently reviewed.  Sinus tachycardia.  Assessment/Plan Principal Problem:   Closed left hip fracture (HCC) Active Problems:   Type 2 diabetes mellitus ([facility])   Essential hypertension    1. Left hip fracture status post mechanical fall for which patient will be kept n.p.o. past midnight pain medication and Dr. 10/01/2020 is planning surgery. 2. Hypertension on amlodipine. 3. Diabetes mellitus type 2 I have dosed 1 dose of Lantus for now.  After surgery restart home medication. 4. COPD not actively wheezing.  Continue inhalers.  Since patient has left hip fracture will need more than 2 midnight stay in inpatient status.   DVT prophylaxis: SCDs for now.  Pharmacological DVT prophylaxis to be started once okay with orthopedic surgery. Code Status: Full code. Family Communication: Discussed with patient. Disposition Plan: May need rehab. Consults called: Orthopedic surgery. Admission status: Inpatient.   Eduard Clos MD [facility] Pager [phone].  If 7PM-7AM, please contact night-coverage [email or web address] Password [facility]  09/29/2020, 8:50 PM

## 2020-09-29 NOTE — ED Triage Notes (Signed)
Pt BIB EMS from home. Pt had mechanical fall and now c/o left leg pain. Pain is isolated to left thigh. Denies LOC and other injuries. Not on blood thinners.

## 2020-09-29 NOTE — ED Provider Notes (Addendum)
[facility]-EMERGENCY DEPT Provider Note   CSN: [id number] Arrival date & time: 09/29/20  1722     History Chief Complaint  Patient presents with  . Leg Injury  . Fall  . Hip Pain    MELVENIA FAVELA is a 75 y.o. female.  Patient s/p trip and fall arrives via EMS. Patient indicates was carrying groceries into home, when tripped on mat, falling onto left leg. C/o pain to left thigh and hip area, constant, dull, mod-severe, non radiating. Was not able to stand post fall due to left upper leg pain. Denies any faintness or dizziness prior to fall, felt fine, at baseline. Denies head injury, loc, or headache. No neck or back pain. No chest pain or sob. No abd pain or nv. Denies any other pain or injury. Skin intact. No associated numbness or weakness.   The history is provided by the patient and the EMS personnel.       Past Medical History:  Diagnosis Date  . Anemia   . Crohn's colitis (HCC)   . Diabetes mellitus    age 76's  . GERD (gastroesophageal reflux disease)   . Hypercholesterolemia   . Hypertension   . Osteopenia   . Osteopenia   . SBO (small bowel obstruction) (HCC) 09/13/15  . TIA (transient ischemic attack)     Patient Active Problem List   Diagnosis Date Noted  . Acute pain of right knee 03/20/2019  . Small bowel obstruction (HCC) 09/14/2015  . Bowel obstruction (HCC) 09/13/2015  . Hyperlipidemia 08/22/2015  . 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 Right-sided extracranial carotid artery stenosis 08/11/2015  . Type 2 diabetes mellitus (HCC) 08/11/2015  . Accelerated hypertension 08/11/2015  . Tobacco abuse 08/11/2015    Past Surgical History:  Procedure Laterality Date  . COLON SURGERY  07/08/2007  . HERNIA REPAIR  11/21/2009  . ILEOSTOMY  02/26/2008   reversal  . TEE WITHOUT CARDIOVERSION N/A 11/10/2015   Procedure: TRANSESOPHAGEAL ECHOCARDIOGRAM (TEE);  Surgeon: Thurmon Fair, MD;  Location: [facility] ENDOSCOPY;  Service: Cardiovascular;  Laterality: N/A;     OB History   No obstetric history on file.     Family History  Problem Relation Age of Onset  . Stroke Mother   . Heart attack Father   . Hypertension Daughter   . Diabetes Brother     Social History   Tobacco Use  . Smoking status: Current Every Day Smoker    Types: Cigarettes  . Smokeless tobacco: Never Used  . Tobacco comment: 09/26/15 5 cigs daily  Vaping Use  . Vaping Use: Never used  Substance Use Topics  . Alcohol use: No    Alcohol/week: 0.0 standard drinks  . Drug use: No    Home Medications Prior to Admission medications   Medication Sig Start Date End Date Taking? Authorizing Provider  amLODipine (NORVASC) 5 MG tablet Take 5 mg by mouth daily.    [provider]  aspirin 81 MG tablet Take 81 mg by mouth daily.    [provider]  Biotin 5000 MCG TABS Take 5,000 mcg by mouth daily.     [provider]  buPROPion (WELLBUTRIN XL) 300 MG 24 hr tablet Take 300 mg by mouth daily.     [provider]  CALCIUM PO Take 1 tablet by mouth daily.      [provider]  Cholecalciferol (VITAMIN D PO) Take 800 Units by mouth  daily.     [provider]  CINNAMON PO Take 500 mg by mouth daily.    [provider]  colchicine 0.6 MG tablet Take 1 tablet (0.6 mg total) by mouth every 4 (four) hours as needed. May cause diarrhea 03/17/19   Hyman Hopes, Margaux, PA-C  docusate sodium (COLACE) 100 MG capsule Take 1 capsule (100 mg total) by mouth every 12 (twelve) hours. 06/23/18   Harris, Cammy Copa, PA-C  fish oil-omega-3 fatty acids 1000 MG capsule Take 2 g by mouth daily.      [provider]  furosemide (LASIX) 20 MG tablet Take 20 mg by mouth as needed.    [provider]  GLUCOSAMINE SULFATE PO Take 500 mg by mouth daily.     [provider]  HYDROcodone-acetaminophen (NORCO/VICODIN) 5-325 MG tablet Take 1  tablet by mouth every 4 (four) hours as needed. 06/22/18   Linwood Dibbles, MD  HYDROcodone-acetaminophen (NORCO/VICODIN) 5-325 MG tablet Take 2 tablets by mouth every 4 (four) hours as needed. 03/17/19   Venter, Margaux, PA-C  insulin regular (NOVOLIN R,HUMULIN R) 100 units/mL injection Inject 22 Units into the skin 2 (two) times daily before a meal.    [provider]  Insulin Regular Human (RELION R IJ) Inject 8-10 Units as directed 3 (three) times daily with meals. Breakfast: 8 units Lunch: 10 units Dinner: 10 units    [provider]  levothyroxine (SYNTHROID, LEVOTHROID) 50 MCG tablet Take 50 mcg by mouth daily. 04/13/18   [provider]  lisinopril-hydrochlorothiazide (PRINZIDE,ZESTORETIC) 10-12.5 MG tablet Take 1 tablet by mouth daily.    [provider]  lovastatin (MEVACOR) 40 MG tablet Take 40 mg by mouth at bedtime.    [provider]  omeprazole (PRILOSEC) 40 MG capsule Take 40 mg by mouth daily.  05/05/15   [provider]  oxyCODONE-acetaminophen (PERCOCET) 5-325 MG tablet Take 1-2 tablets by mouth every 6 (six) hours as needed for severe pain. 06/23/18   Arthor Captain, PA-C  vitamin E 100 UNIT capsule Take 100 Units by mouth daily.      [provider]    Allergies    Simvastatin  Review of Systems   Review of Systems  Constitutional: Negative for fever.  HENT: Negative for nosebleeds.   Eyes: Negative for visual disturbance.  Respiratory: Negative for shortness of breath.   Cardiovascular: Negative for chest pain.  Gastrointestinal: Negative for abdominal pain and vomiting.  Genitourinary: Negative for flank pain.  Musculoskeletal: Negative for back pain and neck pain.  Skin: Negative for wound.  Neurological: Negative for weakness, numbness and headaches.  Hematological: Does not bruise/bleed easily.  Psychiatric/Behavioral: Negative for confusion.    Physical Exam Updated Vital Signs BP (!) 148/89   Pulse  (!) 104   Temp 97.8 F (36.6 C) (Oral)   Resp 14   Ht 1.626 m (5\' 4" )   Wt 67.1 kg   SpO2 98%   BMI 25.40 kg/m   Physical Exam Vitals and nursing note reviewed.  Constitutional:      Appearance: Normal appearance. She is well-developed.  HENT:     Head: Atraumatic.     Nose: Nose normal.     Mouth/Throat:     Mouth: Mucous membranes are moist.  Eyes:     General: No scleral icterus.    Conjunctiva/sclera: Conjunctivae normal.     Pupils: Pupils are equal, round, and reactive to light.  Neck:     Trachea: No tracheal deviation.  Cardiovascular:  Rate and Rhythm: Normal rate and regular rhythm.     Pulses: Normal pulses.     Heart sounds: Normal heart sounds. No murmur heard. No friction rub. No gallop.   Pulmonary:     Effort: Pulmonary effort is normal. No respiratory distress.     Breath sounds: Normal breath sounds.  Chest:     Chest wall: No tenderness.  Abdominal:     General: Bowel sounds are normal. There is no distension.     Palpations: Abdomen is soft.     Tenderness: There is no abdominal tenderness. There is no guarding.     Comments: No abd bruising or contusion.   Genitourinary:    Comments: No cva tenderness.  Musculoskeletal:        General: No swelling.     Cervical back: Normal range of motion and neck supple. No rigidity or tenderness. No muscular tenderness.     Comments: Pelvic stable. Tenderness left hip and mid to distal femur. Compartments of leg/thigh are soft, not tense, and without severe swelling. Distal pulses are palp. No other focal bony tenderness on bil extremity exam. CTLS spine, non tender, aligned, no step off.   Skin:    General: Skin is warm and dry.     Findings: No rash.  Neurological:     Mental Status: She is alert.     Comments: Alert, speech normal. Motor/sens grossly intact bil.   Psychiatric:        Mood and Affect: Mood normal.     ED Results / Procedures / Treatments   Labs (all labs ordered are listed, but  only abnormal results are displayed) Results for orders placed or performed during the hospital encounter of 09/29/20  CBC  Result Value Ref Range   WBC 9.2 4.0 - 10.5 K/uL   RBC 4.75 3.87 - 5.11 MIL/uL   Hemoglobin 12.4 12.0 - 15.0 g/dL   HCT 40.9 81.1 - 91.4 %   MCV 87.2 80.0 - 100.0 fL   MCH 26.1 26.0 - 34.0 pg   MCHC 30.0 30.0 - 36.0 g/dL   RDW 78.2 (H) 95.6 - 21.3 %   Platelets 336 150 - 400 K/uL   nRBC 0.0 0.0 - 0.2 %  Basic metabolic panel  Result Value Ref Range   Sodium 139 135 - 145 mmol/L   Potassium 3.9 3.5 - 5.1 mmol/L   Chloride 100 98 - 111 mmol/L   CO2 26 22 - 32 mmol/L   Glucose, Bld 204 (H) 70 - 99 mg/dL   BUN 22 8 - 23 mg/dL   Creatinine, Ser 0.86 0.44 - 1.00 mg/dL   Calcium 9.7 8.9 - 57.8 mg/dL   GFR, Estimated >46 >96 mL/min   Anion gap 13 5 - 15  Type and screen  Result Value Ref Range   ABO/RH(D) A POS    Antibody Screen NEG    Sample Expiration      10/02/2020,2359 Performed at [facility], [address] [place], [place] [address]    DG Chest 1 View  Result Date: 09/29/2020 CLINICAL DATA:  Patient status post fall. EXAM: CHEST  1 VIEW COMPARISON:  Chest radiograph 06/21/2015. FINDINGS: Monitoring leads overlie the patient. Stable cardiac and mediastinal contours. No large area pulmonary consolidation. No pleural effusion or pneumothorax. IMPRESSION: No acute cardiopulmonary process. Electronically Signed   By: Annia Belt M.D.   On: 09/29/2020 18:45   DG HIP UNILAT W OR W/O PELVIS 2-3 VIEWS LEFT  Result Date: 09/29/2020 CLINICAL DATA:  Larey Seat, left leg pain EXAM: LEFT FEMUR 2 VIEWS; DG HIP (WITH OR WITHOUT PELVIS) 2-3V LEFT COMPARISON:  None. FINDINGS: Left hip: Frontal view of the pelvis as well as frontal and cross-table lateral views of the left hip are obtained. There is a minimally displaced intertrochanteric left hip fracture without significant angulation. The hips remain well aligned. Prominent degenerative changes lower  lumbar spine. Left femur: Frontal and lateral views demonstrate intertrochanteric left hip fracture which is minimally displaced. No other acute bony abnormalities. 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 IMPRESSION: 1. Intertrochanteric left hip fracture. Fracture is minimally displaced without significant angulation. Electronically Signed   By: Sharlet Salina M.D.   On: 09/29/2020 18:47   DG Femur Min 2 Views Left  Result Date: 09/29/2020 CLINICAL DATA:  Larey Seat, left leg pain EXAM: LEFT FEMUR 2 VIEWS; DG HIP (WITH OR WITHOUT PELVIS) 2-3V LEFT COMPARISON:  None. FINDINGS: Left hip: Frontal view of the pelvis as well as frontal and cross-table lateral views of the left hip are obtained. There is a minimally displaced intertrochanteric left hip fracture without significant angulation. The hips remain well aligned. Prominent degenerative changes lower lumbar spine. Left femur: Frontal and lateral views demonstrate intertrochanteric left hip fracture which is minimally displaced. No other acute bony abnormalities. 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 IMPRESSION: 1. Intertrochanteric left hip fracture. Fracture is minimally displaced without significant angulation. Electronically Signed   By: Sharlet Salina M.D.   On: 09/29/2020 18:47    EKG None  Radiology DG Chest 1 View  Result Date: 09/29/2020 CLINICAL DATA:  Patient status post fall. EXAM: CHEST  1 VIEW COMPARISON:  Chest radiograph 06/21/2015. FINDINGS: Monitoring leads overlie the patient. Stable cardiac and mediastinal contours. No large area pulmonary consolidation. No pleural effusion or pneumothorax. IMPRESSION: No acute cardiopulmonary process. Electronically Signed   By: Annia Belt M.D.   On: 09/29/2020 18:45   DG HIP UNILAT W OR W/O PELVIS 2-3 VIEWS LEFT  Result Date: 09/29/2020 CLINICAL DATA:  Larey Seat, left leg  pain EXAM: LEFT FEMUR 2 VIEWS; DG HIP (WITH OR WITHOUT PELVIS) 2-3V LEFT COMPARISON:  None. FINDINGS: Left hip: Frontal view of the pelvis as well as frontal and cross-table lateral views of the left hip are obtained. There is a minimally displaced intertrochanteric left hip fracture without significant angulation. The hips remain well aligned. Prominent degenerative changes lower lumbar spine. Left femur: Frontal and lateral views demonstrate intertrochanteric left hip fracture which is minimally displaced. No other acute bony abnormalities. 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 IMPRESSION: 1. Intertrochanteric left hip fracture. Fracture is minimally displaced without significant angulation. Electronically Signed   By: Sharlet Salina M.D.   On: 09/29/2020 18:47   DG Femur Min 2 Views Left  Result Date: 09/29/2020 CLINICAL DATA:  Larey Seat, left leg pain EXAM: LEFT FEMUR 2 VIEWS; DG HIP (WITH OR WITHOUT PELVIS) 2-3V LEFT COMPARISON:  None. FINDINGS: Left hip: Frontal view of the pelvis as well as frontal and cross-table lateral views of the left hip are obtained. There is a minimally displaced intertrochanteric left hip fracture without significant angulation. The hips remain well aligned. Prominent degenerative changes lower lumbar spine. Left femur: Frontal and lateral views demonstrate intertrochanteric left hip fracture which is minimally displaced. No other acute bony abnormalities. Mild osteoarthritis of the left  hip, with moderate medial compartmental osteoarthritis of the left knee. IMPRESSION: 1. Intertrochanteric left hip fracture. Fracture is minimally displaced without significant angulation. Electronically Signed   By: Sharlet SalinaMichael  Brown M.D.   On: 09/29/2020 18:47    Procedures Procedures   Medications Ordered in ED Medications  sodium chloride 0.9 % bolus 500 mL (500 mLs Intravenous New Bag/Given (Non-Interop) 09/29/20 1848)   HYDROmorphone (DILAUDID) injection 0.5 mg (0.5 mg Intravenous Given 09/29/20 1849)  ondansetron (ZOFRAN) injection 4 mg (4 mg Intravenous Given 09/29/20 1849)    ED Course  I have reviewed the triage vital signs and the nursing notes.  Pertinent labs & imaging results that were available during my care of the patient were reviewed by me and considered in my medical decision making (see chart for details).    MDM Rules/Calculators/A&P                          Iv ns 500 cc bolus. Dilaudid .5 mg iv. zofran iv. Xrays ordered. Labs sent.   Reviewed nursing notes and prior charts for additional history.   Xrays reviewed/interpreted by me - +left hip fx. Orthopedic surgery on call consulted. Discussed pt with Dr Yevette Edwardsumonski - he indicates tentatively they will plan for surgery tomorrow.   Labs reviewed/interpreted by me - hgb normal. k normal.   Hospitalists consulted for admission. Discussed pt with Dr Toniann FailKakrakandy.   Recheck pt, pain improved/controlled.   2049 ortho, dr Marissa Nestleumonsky called back - he indicates after checking with OR, only way patient can be accomodated in AM tomorrow [facility], he [facility], for planned surgery in AM tomorrow around 7 AM - Dr Toniann FailKakrakandy notified (suspect due to bed situation patient may need transfer to preop area in early AM tomorrow as likely [facility] tonight).   Final Clinical Impression(s) / ED Diagnoses Final diagnoses:  Fall from slip, trip, or stumble, initial encounter  Intertrochanteric fracture of left hip, closed, initial encounter [facility](HCC)    Rx / DC Orders ED Discharge Orders    None         Cathren LaineSteinl, Joshoa Shawler, MD 09/29/20 2051

## 2020-09-30 ENCOUNTER — Inpatient Hospital Stay (HOSPITAL_COMMUNITY): Payer: Medicare HMO

## 2020-09-30 ENCOUNTER — Encounter (HOSPITAL_COMMUNITY)
Admission: EM | Disposition: A | Discharge status: Home Health | Payer: Self-pay | Source: Home / Self Care | Attending: Internal Medicine

## 2020-09-30 ENCOUNTER — Inpatient Hospital Stay (HOSPITAL_COMMUNITY): Payer: Medicare HMO | Admitting: Certified Registered Nurse Anesthetist

## 2020-09-30 ENCOUNTER — Encounter (HOSPITAL_COMMUNITY): Payer: Self-pay | Admitting: Internal Medicine

## 2020-09-30 ENCOUNTER — Inpatient Hospital Stay: Admit: 2020-09-30 | Payer: Medicare HMO | Admitting: Orthopedic Surgery

## 2020-09-30 HISTORY — PX: INTRAMEDULLARY (IM) NAIL INTERTROCHANTERIC: SHX5875

## 2020-09-30 LAB — GLUCOSE, CAPILLARY
Glucose-Capillary: 204 mg/dL — ABNORMAL HIGH (ref 70–99)
Glucose-Capillary: 212 mg/dL — ABNORMAL HIGH (ref 70–99)
Glucose-Capillary: 187 mg/dL — ABNORMAL HIGH (ref 70–99)
Glucose-Capillary: 158 mg/dL — ABNORMAL HIGH (ref 70–99)
Glucose-Capillary: 177 mg/dL — ABNORMAL HIGH (ref 70–99)
Glucose-Capillary: 229 mg/dL — ABNORMAL HIGH (ref 70–99)
Glucose-Capillary: 208 mg/dL — ABNORMAL HIGH (ref 70–99)

## 2020-09-30 LAB — TYPE AND SCREEN
ABO/RH(D): A POS
Antibody Screen: NEGATIVE

## 2020-09-30 LAB — CBC
HCT: 35.8 % — ABNORMAL LOW (ref 36.0–46.0)
Hemoglobin: 10.8 g/dL — ABNORMAL LOW (ref 12.0–15.0)
MCH: 26.2 pg (ref 26.0–34.0)
MCHC: 30.2 g/dL (ref 30.0–36.0)
MCV: 86.9 fL (ref 80.0–100.0)
Platelets: 310 10*3/uL (ref 150–400)
RBC: 4.12 MIL/uL (ref 3.87–5.11)
RDW: 21.1 % — ABNORMAL HIGH (ref 11.5–15.5)
WBC: 14.3 10*3/uL — ABNORMAL HIGH (ref 4.0–10.5)
nRBC: 0 % (ref 0.0–0.2)

## 2020-09-30 LAB — CBG MONITORING, ED: Glucose-Capillary: 145 mg/dL — ABNORMAL HIGH (ref 70–99)

## 2020-09-30 LAB — CREATININE, SERUM
Creatinine, Ser: 0.87 mg/dL (ref 0.44–1.00)
GFR, Estimated: >60 mL/min (ref >60)

## 2020-09-30 LAB — SURGICAL PCR SCREEN
MRSA, PCR: NEGATIVE
Staphylococcus aureus: NEGATIVE

## 2020-09-30 LAB — SARS CORONAVIRUS 2 (TAT 6-24 HRS): SARS Coronavirus 2: NEGATIVE

## 2020-09-30 SURGERY — FIXATION, FRACTURE, INTERTROCHANTERIC, WITH INTRAMEDULLARY ROD
Anesthesia: General | Site: Leg Upper | Laterality: Left

## 2020-09-30 MED ORDER — SUGAMMADEX SODIUM 200 MG/2ML IV SOLN
INTRAVENOUS | Status: DC | PRN
  Administered 2020-09-30: 200 mg via INTRAVENOUS

## 2020-09-30 MED ORDER — PROPOFOL 10 MG/ML IV BOLUS
INTRAVENOUS | Status: AC
  Filled 2020-09-30: qty 20

## 2020-09-30 MED ORDER — SENNA 8.6 MG PO TABS
1.0000 | ORAL_TABLET | Freq: Two times a day (BID) | ORAL | Status: DC
  Administered 2020-09-30 – 2020-10-05 (×11): 8.6 mg via ORAL
  Filled 2020-09-30 (×11): qty 1

## 2020-09-30 MED ORDER — ONDANSETRON HCL 4 MG/2ML IJ SOLN: 4.0000 mg | Freq: Four times a day (QID) | INTRAMUSCULAR | Status: DC | PRN

## 2020-09-30 MED ORDER — PHENYLEPHRINE 40 MCG/ML (10ML) SYRINGE FOR IV PUSH (FOR BLOOD PRESSURE SUPPORT)
PREFILLED_SYRINGE | INTRAVENOUS | Status: AC
  Filled 2020-09-30: qty 20

## 2020-09-30 MED ORDER — PHENYLEPHRINE 40 MCG/ML (10ML) SYRINGE FOR IV PUSH (FOR BLOOD PRESSURE SUPPORT)
PREFILLED_SYRINGE | INTRAVENOUS | Status: DC | PRN
  Administered 2020-09-30 (×6): 80 ug via INTRAVENOUS

## 2020-09-30 MED ORDER — SODIUM CHLORIDE 0.9 % IV SOLN: INTRAVENOUS | Status: DC

## 2020-09-30 MED ORDER — PHENYLEPHRINE 40 MCG/ML (10ML) SYRINGE FOR IV PUSH (FOR BLOOD PRESSURE SUPPORT)
PREFILLED_SYRINGE | INTRAVENOUS | Status: AC
  Filled 2020-09-30: qty 10

## 2020-09-30 MED ORDER — ONDANSETRON HCL 4 MG PO TABS: 4.0000 mg | ORAL_TABLET | Freq: Four times a day (QID) | ORAL | Status: DC | PRN

## 2020-09-30 MED ORDER — DEXAMETHASONE SODIUM PHOSPHATE 10 MG/ML IJ SOLN
INTRAMUSCULAR | Status: AC
  Filled 2020-09-30: qty 1

## 2020-09-30 MED ORDER — CHLORHEXIDINE GLUCONATE 0.12 % MT SOLN
15.0000 mL | Freq: Once | OROMUCOSAL | Status: AC
  Administered 2020-09-30: 15 mL via OROMUCOSAL
  Filled 2020-09-30: qty 15

## 2020-09-30 MED ORDER — CEFAZOLIN SODIUM-DEXTROSE 2-4 GM/100ML-% IV SOLN
2.0000 g | INTRAVENOUS | Status: AC
  Administered 2020-09-30: 2 g via INTRAVENOUS

## 2020-09-30 MED ORDER — METOCLOPRAMIDE HCL 5 MG/ML IJ SOLN: 5.0000 mg | Freq: Three times a day (TID) | INTRAMUSCULAR | Status: DC | PRN

## 2020-09-30 MED ORDER — ACETAMINOPHEN 10 MG/ML IV SOLN: 1000.0000 mg | Freq: Once | INTRAVENOUS | Status: DC | PRN

## 2020-09-30 MED ORDER — LIDOCAINE 2% (20 MG/ML) 5 ML SYRINGE
INTRAMUSCULAR | Status: DC | PRN
  Administered 2020-09-30: 100 mg via INTRAVENOUS

## 2020-09-30 MED ORDER — GLUCERNA SHAKE PO LIQD
237.0000 mL | Freq: Three times a day (TID) | ORAL | Status: DC
  Administered 2020-10-01 – 2020-10-04 (×6): 237 mL via ORAL

## 2020-09-30 MED ORDER — HYDROMORPHONE HCL 1 MG/ML IJ SOLN
INTRAMUSCULAR | Status: AC
  Filled 2020-09-30: qty 1

## 2020-09-30 MED ORDER — HYDROMORPHONE HCL 1 MG/ML IJ SOLN
0.2500 mg | INTRAMUSCULAR | Status: DC | PRN
  Administered 2020-09-30 (×2): 0.5 mg via INTRAVENOUS

## 2020-09-30 MED ORDER — DEXAMETHASONE SODIUM PHOSPHATE 10 MG/ML IJ SOLN
INTRAMUSCULAR | Status: DC | PRN
  Administered 2020-09-30: 5 mg via INTRAVENOUS

## 2020-09-30 MED ORDER — HYDROCODONE-ACETAMINOPHEN 5-325 MG PO TABS
1.0000 | ORAL_TABLET | ORAL | Status: DC | PRN
  Administered 2020-09-30: 1 via ORAL
  Administered 2020-10-01 (×2): 2 via ORAL
  Administered 2020-10-03 – 2020-10-04 (×2): 1 via ORAL
  Administered 2020-10-04 – 2020-10-05 (×2): 2 via ORAL
  Filled 2020-09-30 (×3): qty 2
  Filled 2020-09-30 (×2): qty 1
  Filled 2020-09-30: qty 2
  Filled 2020-09-30: qty 1
  Filled 2020-09-30: qty 2

## 2020-09-30 MED ORDER — FENTANYL CITRATE (PF) 250 MCG/5ML IJ SOLN
INTRAMUSCULAR | Status: DC | PRN
  Administered 2020-09-30 (×2): 50 ug via INTRAVENOUS
  Administered 2020-09-30: 100 ug via INTRAVENOUS

## 2020-09-30 MED ORDER — LIDOCAINE 2% (20 MG/ML) 5 ML SYRINGE
INTRAMUSCULAR | Status: AC
  Filled 2020-09-30: qty 5

## 2020-09-30 MED ORDER — POVIDONE-IODINE 10 % EX SWAB: 2.0000 "application " | Freq: Once | CUTANEOUS | Status: DC

## 2020-09-30 MED ORDER — TRANEXAMIC ACID-NACL 1000-0.7 MG/100ML-% IV SOLN
1000.0000 mg | INTRAVENOUS | Status: AC
  Administered 2020-09-30: 1000 mg via INTRAVENOUS

## 2020-09-30 MED ORDER — MEPERIDINE HCL 25 MG/ML IJ SOLN: 6.2500 mg | INTRAMUSCULAR | Status: DC | PRN

## 2020-09-30 MED ORDER — MENTHOL 3 MG MT LOZG: 1.0000 | LOZENGE | OROMUCOSAL | Status: DC | PRN

## 2020-09-30 MED ORDER — ADULT MULTIVITAMIN W/MINERALS CH
1.0000 | ORAL_TABLET | Freq: Every day | ORAL | Status: DC
  Administered 2020-10-01 – 2020-10-05 (×5): 1 via ORAL
  Filled 2020-09-30 (×5): qty 1

## 2020-09-30 MED ORDER — ONDANSETRON HCL 4 MG/2ML IJ SOLN
INTRAMUSCULAR | Status: DC | PRN
  Administered 2020-09-30: 4 mg via INTRAVENOUS

## 2020-09-30 MED ORDER — ORAL CARE MOUTH RINSE: 15.0000 mL | Freq: Once | OROMUCOSAL | Status: AC

## 2020-09-30 MED ORDER — OXYCODONE HCL 5 MG/5ML PO SOLN: 5.0000 mg | Freq: Once | ORAL | Status: DC | PRN

## 2020-09-30 MED ORDER — ACETAMINOPHEN 325 MG PO TABS
325.0000 mg | ORAL_TABLET | Freq: Four times a day (QID) | ORAL | Status: DC | PRN
  Administered 2020-10-05: 650 mg via ORAL
  Filled 2020-09-30 (×2): qty 2

## 2020-09-30 MED ORDER — FENTANYL CITRATE (PF) 250 MCG/5ML IJ SOLN
INTRAMUSCULAR | Status: AC
  Filled 2020-09-30: qty 5

## 2020-09-30 MED ORDER — ONDANSETRON HCL 4 MG/2ML IJ SOLN
INTRAMUSCULAR | Status: AC
  Filled 2020-09-30: qty 2

## 2020-09-30 MED ORDER — MORPHINE SULFATE (PF) 2 MG/ML IV SOLN: 0.5000 mg | INTRAVENOUS | Status: DC | PRN

## 2020-09-30 MED ORDER — ROCURONIUM BROMIDE 100 MG/10ML IV SOLN
INTRAVENOUS | Status: DC | PRN
  Administered 2020-09-30: 50 mg via INTRAVENOUS

## 2020-09-30 MED ORDER — ONDANSETRON HCL 4 MG/2ML IJ SOLN: 4.0000 mg | Freq: Once | INTRAMUSCULAR | Status: DC | PRN

## 2020-09-30 MED ORDER — PROPOFOL 10 MG/ML IV BOLUS
INTRAVENOUS | Status: DC | PRN
  Administered 2020-09-30: 100 mg via INTRAVENOUS

## 2020-09-30 MED ORDER — DOCUSATE SODIUM 100 MG PO CAPS
100.0000 mg | ORAL_CAPSULE | Freq: Two times a day (BID) | ORAL | Status: DC
  Administered 2020-09-30 – 2020-10-05 (×11): 100 mg via ORAL
  Filled 2020-09-30 (×11): qty 1

## 2020-09-30 MED ORDER — CHLORHEXIDINE GLUCONATE 4 % EX LIQD: 60.0000 mL | Freq: Once | CUTANEOUS | Status: DC

## 2020-09-30 MED ORDER — ENOXAPARIN SODIUM 40 MG/0.4ML ~~LOC~~ SOLN
40.0000 mg | SUBCUTANEOUS | Status: DC
  Administered 2020-10-01 – 2020-10-05 (×5): 40 mg via SUBCUTANEOUS
  Filled 2020-09-30 (×5): qty 0.4

## 2020-09-30 MED ORDER — 0.9 % SODIUM CHLORIDE (POUR BTL) OPTIME
TOPICAL | Status: DC | PRN
  Administered 2020-09-30: 1000 mL

## 2020-09-30 MED ORDER — CEFAZOLIN SODIUM-DEXTROSE 2-4 GM/100ML-% IV SOLN
INTRAVENOUS | Status: AC
  Filled 2020-09-30: qty 100

## 2020-09-30 MED ORDER — HYDROCODONE-ACETAMINOPHEN 7.5-325 MG PO TABS
1.0000 | ORAL_TABLET | ORAL | Status: DC | PRN
  Administered 2020-10-01: 2 via ORAL
  Administered 2020-10-02: 1 via ORAL
  Administered 2020-10-02: 2 via ORAL
  Administered 2020-10-03 – 2020-10-05 (×6): 1 via ORAL
  Filled 2020-09-30 (×5): qty 1
  Filled 2020-09-30: qty 2
  Filled 2020-09-30: qty 1
  Filled 2020-09-30: qty 2
  Filled 2020-09-30: qty 1

## 2020-09-30 MED ORDER — PHENOL 1.4 % MT LIQD: 1.0000 | OROMUCOSAL | Status: DC | PRN

## 2020-09-30 MED ORDER — CEFAZOLIN SODIUM-DEXTROSE 2-4 GM/100ML-% IV SOLN
2.0000 g | Freq: Four times a day (QID) | INTRAVENOUS | Status: AC
  Administered 2020-09-30 (×2): 2 g via INTRAVENOUS
  Filled 2020-09-30 (×2): qty 100

## 2020-09-30 MED ORDER — OXYCODONE HCL 5 MG PO TABS: 5.0000 mg | ORAL_TABLET | Freq: Once | ORAL | Status: DC | PRN

## 2020-09-30 MED ORDER — METOCLOPRAMIDE HCL 5 MG PO TABS: 5.0000 mg | ORAL_TABLET | Freq: Three times a day (TID) | ORAL | Status: DC | PRN

## 2020-09-30 MED ORDER — LACTATED RINGERS IV SOLN: INTRAVENOUS | Status: DC

## 2020-09-30 MED ORDER — TRANEXAMIC ACID-NACL 1000-0.7 MG/100ML-% IV SOLN
INTRAVENOUS | Status: AC
  Filled 2020-09-30: qty 100

## 2020-09-30 SURGICAL SUPPLY — 57 items
ADH SKN CLS APL DERMABOND .7 (GAUZE/BANDAGES/DRESSINGS) ×1
ALCOHOL 70% 16 OZ (MISCELLANEOUS) ×2 IMPLANT
APL PRP STRL LF DISP 70% ISPRP (MISCELLANEOUS) ×2
BIT DRILL CANN LG 4.3MM (BIT) IMPLANT
BNDG COHESIVE 4X5 TAN STRL (GAUZE/BANDAGES/DRESSINGS) IMPLANT
CHLORAPREP W/TINT 26 (MISCELLANEOUS) ×3 IMPLANT
COVER PERINEAL POST (MISCELLANEOUS) ×2 IMPLANT
COVER SURGICAL LIGHT HANDLE (MISCELLANEOUS) ×2 IMPLANT
COVER WAND RF STERILE (DRAPES) ×1 IMPLANT
DERMABOND ADVANCED (GAUZE/BANDAGES/DRESSINGS) ×1
DERMABOND ADVANCED .7 DNX12 (GAUZE/BANDAGES/DRESSINGS) ×2 IMPLANT
DRAPE C-ARM 42X72 X-RAY (DRAPES) ×2 IMPLANT
DRAPE C-ARMOR (DRAPES) ×2 IMPLANT
DRAPE HALF SHEET 40X57 (DRAPES) ×2 IMPLANT
DRAPE IMP U-DRAPE 54X76 (DRAPES) ×4 IMPLANT
DRAPE STERI IOBAN 125X83 (DRAPES) ×2 IMPLANT
DRAPE U-SHAPE 47X51 STRL (DRAPES) ×4 IMPLANT
DRILL BIT CANN LG 4.3MM (BIT) ×2
DRSG AQUACEL AG ADV 3.5X 4 (GAUZE/BANDAGES/DRESSINGS) ×1 IMPLANT
DRSG AQUACEL AG ADV 3.5X 6 (GAUZE/BANDAGES/DRESSINGS) ×1 IMPLANT
DRSG MEPILEX BORDER 4X4 (GAUZE/BANDAGES/DRESSINGS) ×2 IMPLANT
DRSG PAD ABDOMINAL 8X10 ST (GAUZE/BANDAGES/DRESSINGS) IMPLANT
ELECT REM PT RETURN 9FT ADLT (ELECTROSURGICAL) ×2
ELECTRODE REM PT RTRN 9FT ADLT (ELECTROSURGICAL) ×1 IMPLANT
FACESHIELD WRAPAROUND (MASK) ×4 IMPLANT
FACESHIELD WRAPAROUND OR TEAM (MASK) ×1 IMPLANT
GLOVE BIO SURGEON STRL SZ8.5 (GLOVE) ×4 IMPLANT
GLOVE BIOGEL M 7.0 STRL (GLOVE) ×2 IMPLANT
GLOVE BIOGEL PI IND STRL 7.5 (GLOVE) ×1 IMPLANT
GLOVE BIOGEL PI IND STRL 8.5 (GLOVE) ×1 IMPLANT
GLOVE BIOGEL PI INDICATOR 7.5 (GLOVE) ×1
GLOVE BIOGEL PI INDICATOR 8.5 (GLOVE) ×1
GOWN STRL REUS W/ TWL LRG LVL3 (GOWN DISPOSABLE) ×2 IMPLANT
GOWN STRL REUS W/ TWL XL LVL3 (GOWN DISPOSABLE) ×1 IMPLANT
GOWN STRL REUS W/TWL 2XL LVL3 (GOWN DISPOSABLE) ×2 IMPLANT
GOWN STRL REUS W/TWL LRG LVL3 (GOWN DISPOSABLE) ×4
GOWN STRL REUS W/TWL XL LVL3 (GOWN DISPOSABLE) ×2
GUIDEPIN 3.2X17.5 THRD DISP (PIN) ×2 IMPLANT
HIP FRA NAIL LAG SCREW 10.5X90 (Orthopedic Implant) ×2 IMPLANT
KIT TURNOVER KIT B (KITS) ×2 IMPLANT
MANIFOLD NEPTUNE II (INSTRUMENTS) ×1 IMPLANT
MARKER SKIN DUAL TIP RULER LAB (MISCELLANEOUS) ×2 IMPLANT
NAIL HIP FRACT 130D 11X180 (Screw) ×1 IMPLANT
NS IRRIG 1000ML POUR BTL (IV SOLUTION) ×2 IMPLANT
PACK GENERAL/GYN (CUSTOM PROCEDURE TRAY) ×2 IMPLANT
PACK UNIVERSAL I (CUSTOM PROCEDURE TRAY) ×2 IMPLANT
PAD ARMBOARD 7.5X6 YLW CONV (MISCELLANEOUS) ×4 IMPLANT
PADDING CAST ABS 4INX4YD NS (CAST SUPPLIES)
PADDING CAST ABS COTTON 4X4 ST (CAST SUPPLIES) IMPLANT
SCREW BONE CORTICAL 5.0X3 (Screw) ×1 IMPLANT
SCREW LAG HIP FRA NAIL 10.5X90 (Orthopedic Implant) IMPLANT
SUT MNCRL AB 3-0 PS2 27 (SUTURE) ×2 IMPLANT
SUT MON AB 2-0 CT1 27 (SUTURE) ×2 IMPLANT
SUT VIC AB 1 CT1 27 (SUTURE) ×2
SUT VIC AB 1 CT1 27XBRD ANBCTR (SUTURE) ×1 IMPLANT
TOWEL GREEN STERILE (TOWEL DISPOSABLE) ×2 IMPLANT
TOWEL GREEN STERILE FF (TOWEL DISPOSABLE) ×2 IMPLANT

## 2020-09-30 NOTE — Anesthesia Preprocedure Evaluation (Signed)
Anesthesia Evaluation  Patient identified by MRN, date of birth, ID band Patient awake    Reviewed: Allergy & Precautions, NPO status , Patient's Chart, lab work & pertinent test results  Airway Mallampati: I  TM Distance: >3 FB Neck ROM: Full    Dental no notable dental hx. (+) Edentulous Upper, Edentulous Lower   Pulmonary COPD,  COPD inhaler, Current Smoker and Patient abstained from smoking.,    Pulmonary exam normal breath sounds clear to auscultation       Cardiovascular Exercise Tolerance: Good hypertension, Pt. on medications Normal cardiovascular exam Rhythm:Regular Rate:Normal  11/2015 Echo Left ventricle: The cavity size was normal. There was mild  concentric hypertrophy. Systolic function was normal. The  estimated ejection fraction was in the range of 60% to 65%. Wall  motion was normal; there were no regional wall motion  abnormalities.  - Aortic valve: No evidence of vegetation. There was trivial  regurgitation.  - Mitral valve: Mildly calcified annulus. No evidence of  vegetation.  - Left atrium: The atrium was mildly dilated. No evidence of  thrombus in the atrial cavity or appendage. No spontaneous echo  contrast was observed.    Neuro/Psych TIA   GI/Hepatic Neg liver ROS, GERD  ,Hx of Crohn's   Endo/Other  diabetes  Renal/GU Lab Results      Component                Value               Date                      CREATININE               0.98                09/29/2020                BUN                      22                  09/29/2020                NA                       139                 09/29/2020                K                        3.9                 09/29/2020                CL                       100                 09/29/2020                CO2                      26                  09/29/2020  Musculoskeletal   Abdominal   Peds   Hematology Lab Results      Component                Value               Date                      WBC                      9.2                 09/29/2020                HGB                      12.4                09/29/2020                HCT                      41.4                09/29/2020                MCV                      87.2                09/29/2020                PLT                      336                 09/29/2020              Anesthesia Other Findings   Reproductive/Obstetrics                            Anesthesia Physical Anesthesia Plan  ASA: III  Anesthesia Plan: General   Post-op Pain Management:    Induction: Intravenous  PONV Risk Score and Plan: 3 and Treatment may vary due to age or medical condition, Ondansetron and Dexamethasone  Airway Management Planned: Oral ETT  Additional Equipment: None  Intra-op Plan:   Post-operative Plan: Extubation in OR  Informed Consent: I have reviewed the patients History and Physical, chart, labs and discussed the procedure including the risks, benefits and alternatives for the proposed anesthesia with the patient or authorized representative who has indicated his/her understanding and acceptance.     Dental advisory given  Plan Discussed with: Anesthesiologist and CRNA  Anesthesia Plan Comments: (GA w ETT)        Anesthesia Quick Evaluation

## 2020-09-30 NOTE — Progress Notes (Addendum)
PROGRESS NOTE    Joanne FergusonJane C Oconnell  ZOX:[id number] DOB: October 09, 1945 DOA: 09/29/2020 PCP: Georgianne Fickamachandran, Ajith, MD   Chief Complaint  Patient presents with  . Leg Injury  . Fall  . Hip Pain  Brief Narrative: 75 old female with hypertension diabetes mellitus COPD, TIA, Crohn's disease who had a fall when picking up groceries after she slipped on the mat and fell on the floor, without loss of consciousness.  In the ED had left hip fracture seen on the x-rays orthopedic was consulted labs reviewed stable EKG with sinus tachycardia and LVH Covid negative.  Patient was admitted for operative intervention  Subjective: Seen/examined post op today Feels fine, just woke up  Assessment & Plan:  Closed left hip fracture initial encounter secondary to mechanical fall: s/p  IM fixation of left femur Defer.DVT prophylaxis ( on lovenox here),  pain management inpatient and outpatient Rx to orthopedics. PTOT soon and likely needs SNF, lives alone.  Type 2 diabetes mellitus, holding home insulin preop/while npo-continue sliding scale insulin.  Monitor CBG.  Home aspirin on hold hopefully resume postop. Recent Labs  Lab 09/30/20 0424 09/30/20 0641 09/30/20 0756 09/30/20 1017 09/30/20 1149  GLUCAP 204* 212* 187* 158* 177*   Hypertension blood pressure poorly controlled,resume home meds amlodipine, Lasix post op. Monitor bp and adjust meds.  Hyperlipidemia continue statin  COPD not in exacerbation continue inhalers as needed.  Add IS  Nutrition: Diet Order            Diet Carb Modified Fluid consistency: Thin; Room service appropriate? Yes  Diet effective now                 Nutrition Problem: Increased nutrient needs Etiology: post-op healing Signs/Symptoms: estimated needs Interventions: MVI,Glucerna shake  Body mass index is 25.4 kg/m.  DVT prophylaxis: enoxaparin (LOVENOX) injection 40 mg Start: 10/01/20 0800 SCDs Start: 09/30/20 1146 Code Status:   Code Status: Full Code  Family  Communication: plan of care discussed with patient at bedside.  Status is: Inpatient Remains inpatient appropriate because:Hip fracture and need for surgical intervention  Dispo: The patient is from: Home  LIVES ALONE              Anticipated d/c is to: SNF              Anticipated d/c date is: 2 days              Patient currently is not medically stable to d/c.   Difficult to place patient No  Consultants:see note  Procedures:see note  Culture/Microbiology    Component Value Date/Time   SDES  06/22/2018 1430    FLUID Performed at [facility] [place], [address] [place], [place] [address]    [facility]  06/[facility]2018 1430    RIGHT KNEE Performed at [facility] [place], [address] [place], [place] [address]    CULT  06/22/2018 1154    NO GROWTH 5 DAYS Performed at [facility], [address] [place], [place] [address]    REPTSTATUS 06/22/2018 FINAL 06/22/2018 1430    Other culture-see note  Medications: Scheduled Meds: . amLODipine  5 mg Oral Daily  . buPROPion  300 mg Oral Daily  . docusate sodium  100 mg Oral BID  . [START ON 10/01/2020] enoxaparin (LOVENOX) injection  40 mg Subcutaneous Q24H  . [START ON 10/01/2020] feeding supplement (GLUCERNA SHAKE)  237 mL Oral TID BM  . HYDROmorphone      .  insulin aspart  0-9 Units Subcutaneous Q4H  . levothyroxine  50 mcg Oral Q0600  . [START ON 10/01/2020] multivitamin with minerals  1 tablet Oral Daily  . rosuvastatin  40 mg Oral Daily  . senna  1 tablet Oral BID   Continuous Infusions: . sodium chloride 75 mL/hr at 09/30/20 1237  .  ceFAZolin (ANCEF) IV    . methocarbamol (ROBAXIN) IV      Antimicrobials: Anti-infectives (From admission, onward)   Start     Dose/Rate Route Frequency Ordered Stop   09/30/20 1430  ceFAZolin (ANCEF) IVPB 2g/100 mL premix        2 g 200 mL/hr over 30 Minutes Intravenous Every 6 hours 09/30/20 1146 10/01/20 0229   09/30/20 0830  ceFAZolin (ANCEF) IVPB 2g/100 mL  premix        2 g 200 mL/hr over 30 Minutes Intravenous On call to O.R. 09/30/20 0815 09/30/20 0848   09/30/20 0820  ceFAZolin (ANCEF) 2-4 GM/100ML-% IVPB       Note to Pharmacy: Tawanna Sat   : cabinet override      09/30/20 0820 09/30/20 0849     Objective: Vitals: Today's Vitals   09/30/20 1115 09/30/20 1120 09/30/20 1146 09/30/20 1200  BP:  (!) 174/86 (!) 174/74   Pulse: 95 97 (!) 103   Resp: 16 15 16    Temp:  (!) 97 F (36.1 C) 98.7 F (37.1 C)   TempSrc:   Oral   SpO2: 100% 100% 91%   Weight:      Height:      PainSc:    0-No pain    Intake/Output Summary (Last 24 hours) at 09/30/2020 1302 Last data filed at 09/30/2020 1237 Gross per 24 hour  Intake 1816.33 ml  Output 50 ml  Net 1766.33 ml   Filed Weights   09/29/20 1802  Weight: 67.1 kg   Weight change:   Intake/Output from previous day: 01/27 0701 - 01/28 0700 In: 1500 [IV Piggyback:1500] Out: -  Intake/Output this shift: Total I/O In: 316.3 [I.V.:116.3; IV Piggyback:200] Out: 50 [Blood:50] Filed Weights   09/29/20 1802  Weight: 67.1 kg    Examination: General exam: AAOx3.  Elderly, frail, weak appearing. HEENT:Oral mucosa moist, Ear/Nose WNL grossly,dentition normal. Respiratory system: bilaterally clear,no wheezing or crackles,no use of accessory muscle, non tender. Cardiovascular system: S1 & S2 +, regular, No JVD. Gastrointestinal system: Abdomen soft, NT,ND, BS+. Nervous System:Alert, awake, left hip fracture site tender Extremities: No edema, distal peripheral pulses palpable.  Left hip surgical site in Aquacel in place. Skin: No rashes,no icterus. MSK: Normal muscle bulk,tone, power   Data Reviewed: I have personally reviewed following labs and imaging studies CBC: Recent Labs  Lab 09/29/20 1850  WBC 9.2  HGB 12.4  HCT 41.4  MCV 87.2  PLT 336   Basic Metabolic Panel: Recent Labs  Lab 09/29/20 1850  NA 139  K 3.9  CL 100  CO2 26  GLUCOSE 204*  BUN 22  CREATININE 0.98   CALCIUM 9.7   GFR: Estimated Creatinine Clearance: 47.5 mL/min (by C-G formula based on SCr of 0.98 mg/dL). Liver Function Tests: No results for input(s): AST, ALT, ALKPHOS, BILITOT, PROT, ALBUMIN in the last 168 hours. No results for input(s): LIPASE, AMYLASE in the last 168 hours. No results for input(s): AMMONIA in the last 168 hours. Coagulation Profile: No results for input(s): INR, PROTIME in the last 168 hours. Cardiac Enzymes: No results for input(s): CKTOTAL, CKMB, CKMBINDEX, TROPONINI in the last  168 hours. BNP (last 3 results) No results for input(s): PROBNP in the last 8760 hours. HbA1C: No results for input(s): HGBA1C in the last 72 hours. CBG: Recent Labs  Lab 09/30/20 0424 09/30/20 0641 09/30/20 0756 09/30/20 1017 09/30/20 1149  GLUCAP 204* 212* 187* 158* 177*   Lipid Profile: No results for input(s): CHOL, HDL, LDLCALC, TRIG, CHOLHDL, LDLDIRECT in the last 72 hours. Thyroid Function Tests: No results for input(s): TSH, T4TOTAL, FREET4, T3FREE, THYROIDAB in the last 72 hours. Anemia Panel: No results for input(s): VITAMINB12, FOLATE, FERRITIN, TIBC, IRON, RETICCTPCT in the last 72 hours. Sepsis Labs: No results for input(s): PROCALCITON, LATICACIDVEN in the last 168 hours.  Recent Results (from the past 240 hour(s))  SARS CORONAVIRUS 2 (TAT 6-24 HRS) Nasopharyngeal Nasopharyngeal Swab     Status: None   Collection Time: 09/29/20  9:00 PM   Specimen: Nasopharyngeal Swab  Result Value Ref Range Status   SARS Coronavirus 2 NEGATIVE NEGATIVE Final    Comment: (NOTE) SARS-CoV-2 target nucleic acids are NOT DETECTED.  The SARS-CoV-2 RNA is generally detectable in upper and lower respiratory specimens during the acute phase of infection. Negative results do not preclude SARS-CoV-2 infection, do not rule out co-infections with other pathogens, and should not be used as the sole basis for treatment or other patient management decisions. Negative results must  be combined with clinical observations, patient history, and epidemiological information. The expected result is Negative.  Fact Sheet for Patients: HairSlick.no  Fact Sheet for Healthcare Providers: quierodirigir.com  This test is not yet approved or cleared by the [place] FDA and  has been authorized for detection and/or diagnosis of SARS-CoV-2 by FDA under an Emergency Use Authorization (EUA). This EUA will remain  in effect (meaning this test can be used) for the duration of the COVID-19 declaration under Se ction 564(b)(1) of the Act, 21 U.S.C. section 360bbb-3(b)(1), unless the authorization is terminated or revoked sooner.  Performed at [facility], [address] [place], [place] [address]   Surgical pcr screen     Status: None   Collection Time: 09/30/20  2:13 AM   Specimen: Nasal Mucosa; Nasal Swab  Result Value Ref Range Status   MRSA, PCR NEGATIVE NEGATIVE Final   Staphylococcus aureus NEGATIVE NEGATIVE Final    Comment: (NOTE) The Xpert SA Assay (FDA approved for NASAL specimens in patients 75 years of age and older), is one component of a comprehensive surveillance program. It is not intended to diagnose infection nor to guide or monitor treatment. Performed at [facility], [address] [place], [place] [address]      Radiology Studies: DG Chest 1 View  Result Date: 09/29/2020 CLINICAL DATA:  Patient status post fall. EXAM: CHEST  1 VIEW COMPARISON:  Chest radiograph 06/21/2015. FINDINGS: Monitoring leads overlie the patient. Stable cardiac and mediastinal contours. No large area pulmonary consolidation. No pleural effusion or pneumothorax. IMPRESSION: No acute cardiopulmonary process. Electronically Signed   By: Annia Belt M.D.   On: 09/29/2020 18:45   Pelvis Portable  Result Date: 09/30/2020 CLINICAL DATA:  Left femur ORIF. EXAM: PORTABLE PELVIS 1-2 VIEWS COMPARISON:  Left hip  x-rays from same day and yesterday. FINDINGS: Interval gamma nail fixation of the essentially nondisplaced left intertrochanteric femur fracture. Alignment is near anatomic. No evidence of hardware failure or loosening. Postsurgical changes in the surrounding soft tissues. IMPRESSION: 1. Interval left intertrochanteric femur fracture ORIF without acute postoperative complication. Electronically Signed   By: Teresita Madura  Derry M.D.   On: 09/30/2020 10:46   DG C-Arm 1-60 Min  Result Date: 09/30/2020 CLINICAL DATA:  Status post ORIF of proximal left femur fracture. EXAM: DG C-ARM 1-60 MIN; OPERATIVE LEFT HIP WITH PELVIS COMPARISON:  09/29/2020 FLUOROSCOPY TIME:  Fluoroscopy Time:  1 minutes 4 seconds Radiation Exposure Index (if provided by the fluoroscopic device): 10.43 mGy Number of Acquired Spot Images: 0 FINDINGS: Four images from portable C-arm radiography obtained in the operating room show open reduction and internal fixation of the intertrochanteric fracture involving the proximal left femur. IMPRESSION: 1. Status post ORIF of proximal left femur fracture. Electronically Signed   By: Signa Kell M.D.   On: 09/30/2020 10:08   DG HIP OPERATIVE UNILAT WITH PELVIS LEFT  Result Date: 09/30/2020 CLINICAL DATA:  Status post ORIF of proximal left femur fracture. EXAM: DG C-ARM 1-60 MIN; OPERATIVE LEFT HIP WITH PELVIS COMPARISON:  09/29/2020 FLUOROSCOPY TIME:  Fluoroscopy Time:  1 minutes 4 seconds Radiation Exposure Index (if provided by the fluoroscopic device): 10.43 mGy Number of Acquired Spot Images: 0 FINDINGS: Four images from portable C-arm radiography obtained in the operating room show open reduction and internal fixation of the intertrochanteric fracture involving the proximal left femur. IMPRESSION: 1. Status post ORIF of proximal left femur fracture. Electronically Signed   By: Signa Kell M.D.   On: 09/30/2020 10:08   DG HIP UNILAT W OR W/O PELVIS 2-3 VIEWS LEFT  Result Date:  09/29/2020 CLINICAL DATA:  Larey Seat, left leg pain EXAM: LEFT FEMUR 2 VIEWS; DG HIP (WITH OR WITHOUT PELVIS) 2-3V LEFT COMPARISON:  None. FINDINGS: Left hip: Frontal view of the pelvis as well as frontal and cross-table lateral views of the left hip are obtained. There is a minimally displaced intertrochanteric left hip fracture without significant angulation. The hips remain well aligned. Prominent degenerative changes lower lumbar spine. Left femur: Frontal and lateral views demonstrate intertrochanteric left hip fracture which is minimally displaced. No other acute bony abnormalities. 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 IMPRESSION: 1. Intertrochanteric left hip fracture. Fracture is minimally displaced without significant angulation. Electronically Signed   By: Sharlet Salina M.D.   On: 09/29/2020 18:47   DG Femur Min 2 Views Left  Result Date: 09/29/2020 CLINICAL DATA:  Larey Seat, left leg pain EXAM: LEFT FEMUR 2 VIEWS; DG HIP (WITH OR WITHOUT PELVIS) 2-3V LEFT COMPARISON:  None. FINDINGS: Left hip: Frontal view of the pelvis as well as frontal and cross-table lateral views of the left hip are obtained. There is a minimally displaced intertrochanteric left hip fracture without significant angulation. The hips remain well aligned. Prominent degenerative changes lower lumbar spine. Left femur: Frontal and lateral views demonstrate intertrochanteric left hip fracture which is minimally displaced. No other acute bony abnormalities. 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 IMPRESSION: 1. Intertrochanteric left hip fracture. Fracture is minimally displaced without significant angulation. Electronically Signed   By: Sharlet Salina M.D.   On: 09/29/2020 18:47     LOS: 1 day   Lanae Boast, MD [facility]  09/30/2020, 1:02 PM

## 2020-09-30 NOTE — ED Notes (Signed)
Attempted to call report to [facility], but staff states primary RN is not available and requested I call back in 15 minutes.

## 2020-09-30 NOTE — Anesthesia Postprocedure Evaluation (Signed)
Anesthesia Post Note  Patient: [name]  Procedure(s) Performed: INTRAMEDULLARY (IM) NAIL (Left Leg Upper)     Patient location during evaluation: PACU Anesthesia Type: General Level of consciousness: awake and alert Pain management: pain level controlled Vital Signs Assessment: post-procedure vital signs reviewed and stable Respiratory status: spontaneous breathing, nonlabored ventilation, respiratory function stable and patient connected to nasal cannula oxygen Cardiovascular status: blood pressure returned to baseline and stable Postop Assessment: no apparent nausea or vomiting Anesthetic complications: no   No complications documented.  Last Vitals:  Vitals:   09/30/20 1008 09/30/20 1020  BP: (!) 174/73 (!) 170/69  Pulse: 89 93  Resp: (!) 21 13  Temp:    SpO2: 100% 91%    Last Pain:  Vitals:   09/30/20 1030  TempSrc:   PainSc: 10-Worst pain ever                 Trevor Iha

## 2020-09-30 NOTE — ED Notes (Signed)
ED TO INPATIENT HANDOFF REPORT  Name/Age/Gender [name] 75 y.o. female  Code Status    Code Status Orders  (From admission, onward)         Start     Ordered   09/29/20 2047  Full code  Continuous        09/29/20 2049        Code Status History    Date Active Date Inactive Code Status Order ID Comments User Context   09/14/2015 0220 09/15/2015 1934 Full Code [id number]  Kinsinger, De Blanch, MD Inpatient   Advance Care Planning Activity    Advance Directive Documentation   Flowsheet Row Most Recent Value  Type of Advance Directive Healthcare Power of Attorney, Living will  Pre-existing out of facility DNR order (yellow form or pink MOST form) -  "MOST" Form in Place? -      Home/SNF/Other Home  Chief Complaint Closed left hip fracture (HCC) [S72.002A] Closed left hip fracture, initial encounter (HCC) [S72.002A]  Level of Care/Admitting Diagnosis ED Disposition    ED Disposition Condition Comment   Admit  Hospital Area: [facility] [[id number]]  Level of Care: Med-Surg [16]  May admit patient to [facility] or [facility] if equivalent level of care is available:: No  Covid Evaluation: Asymptomatic Screening Protocol (No Symptoms)  Diagnosis: Closed left hip fracture, initial encounter [facility]) [[id number]]  Admitting Physician: Eduard Clos [phone]  Attending Physician: Eduard Clos [phone]  Estimated length of stay: past midnight tomorrow  Certification:: I certify this patient will need inpatient services for at least 2 midnights       Medical History Past Medical History:  Diagnosis Date  . Anemia   . Crohn's colitis (HCC)   . Diabetes mellitus    age 57's  . GERD (gastroesophageal reflux disease)   . Hypercholesterolemia   . Hypertension   . Osteopenia   . Osteopenia   . SBO (small bowel obstruction) (HCC) 09/13/15  . TIA (transient ischemic attack)     Allergies Allergies  Allergen Reactions  . Simvastatin Other  (See Comments)    Muscle cramps    IV Location/Drains/Wounds Patient Lines/Drains/Airways Status    Active Line/Drains/Airways    Name Placement date Placement time Site Days   Peripheral IV 09/29/20 Right;Posterior Forearm 09/29/20  1844  Forearm  1          Labs/Imaging Results for orders placed or performed during the hospital encounter of 09/29/20 (from the past 48 hour(s))  CBC     Status: Abnormal   Collection Time: 09/29/20  6:50 PM  Result Value Ref Range   WBC 9.2 4.0 - 10.5 K/uL   RBC 4.75 3.87 - 5.11 MIL/uL   Hemoglobin 12.4 12.0 - 15.0 g/dL   HCT 06.2 69.4 - 85.4 %   MCV 87.2 80.0 - 100.0 fL   MCH 26.1 26.0 - 34.0 pg   MCHC 30.0 30.0 - 36.0 g/dL   RDW 62.7 (H) 03.5 - 00.9 %   Platelets 336 150 - 400 K/uL   nRBC 0.0 0.0 - 0.2 %    Comment: Performed at [facility], [address] [place], [place] [address]  Basic metabolic panel     Status: Abnormal   Collection Time: 09/29/20  6:50 PM  Result Value Ref Range   Sodium 139 135 - 145 mmol/L   Potassium 3.9 3.5 - 5.1 mmol/L   Chloride 100 98 - 111 mmol/L   CO2 26  22 - 32 mmol/L   Glucose, Bld 204 (H) 70 - 99 mg/dL    Comment: Glucose reference range applies only to samples taken after fasting for at least 8 hours.   BUN 22 8 - 23 mg/dL   Creatinine, Ser 1.61 0.44 - 1.00 mg/dL   Calcium 9.7 8.9 - 09.6 mg/dL   GFR, Estimated >04 >54 mL/min    Comment: (NOTE) Calculated using the CKD-EPI Creatinine Equation (2021)    Anion gap 13 5 - 15    Comment: Performed at [facility], [address] [place], [place] [address]  Type and screen     Status: None   Collection Time: 09/29/20  6:50 PM  Result Value Ref Range   ABO/RH(D) A POS    Antibody Screen NEG    Sample Expiration      10/02/2020,2359 Performed at [facility], [address], [place], [place] [address]   CBG monitoring, ED     Status: Abnormal   Collection Time: 09/29/20  9:49 PM  Result  Value Ref Range   Glucose-Capillary 155 (H) 70 - 99 mg/dL    Comment: Glucose reference range applies only to samples taken after fasting for at least 8 hours.  CBG monitoring, ED     Status: Abnormal   Collection Time: 09/30/20 12:22 AM  Result Value Ref Range   Glucose-Capillary 145 (H) 70 - 99 mg/dL    Comment: Glucose reference range applies only to samples taken after fasting for at least 8 hours.   DG Chest 1 View  Result Date: 09/29/2020 CLINICAL DATA:  Patient status post fall. EXAM: CHEST  1 VIEW COMPARISON:  Chest radiograph 06/21/2015. FINDINGS: Monitoring leads overlie the patient. Stable cardiac and mediastinal contours. No large area pulmonary consolidation. No pleural effusion or pneumothorax. IMPRESSION: No acute cardiopulmonary process. Electronically Signed   By: Annia Belt M.D.   On: 09/29/2020 18:45   DG HIP UNILAT W OR W/O PELVIS 2-3 VIEWS LEFT  Result Date: 09/29/2020 CLINICAL DATA:  Larey Seat, left leg pain EXAM: LEFT FEMUR 2 VIEWS; DG HIP (WITH OR WITHOUT PELVIS) 2-3V LEFT COMPARISON:  None. FINDINGS: Left hip: Frontal view of the pelvis as well as frontal and cross-table lateral views of the left hip are obtained. There is a minimally displaced intertrochanteric left hip fracture without significant angulation. The hips remain well aligned. Prominent degenerative changes lower lumbar spine. Left femur: Frontal and lateral views demonstrate intertrochanteric left hip fracture which is minimally displaced. No other acute bony abnormalities. 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 IMPRESSION: 1. Intertrochanteric left hip fracture. Fracture is minimally displaced without significant angulation. Electronically Signed   By: Sharlet Salina M.D.   On: 09/29/2020 18:47   DG Femur Min 2 Views Left  Result Date: 09/29/2020 CLINICAL DATA:  Larey Seat, left leg pain EXAM: LEFT FEMUR 2 VIEWS; DG HIP (WITH OR WITHOUT PELVIS) 2-3V  LEFT COMPARISON:  None. FINDINGS: Left hip: Frontal view of the pelvis as well as frontal and cross-table lateral views of the left hip are obtained. There is a minimally displaced intertrochanteric left hip fracture without significant angulation. The hips remain well aligned. Prominent degenerative changes lower lumbar spine. Left femur: Frontal and lateral views demonstrate intertrochanteric left hip fracture which is minimally displaced. No other acute bony abnormalities. 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 IMPRESSION: 1. Intertrochanteric left hip fracture. Fracture  is minimally displaced without significant angulation. Electronically Signed   By: Sharlet Salina M.D.   On: 09/29/2020 18:47    Pending Labs Unresulted Labs (From admission, onward)          Start     Ordered   09/29/20 1954  SARS CORONAVIRUS 2 (TAT 6-24 HRS) Nasopharyngeal Nasopharyngeal Swab  (Tier 3 - Symptomatic/asymptomatic with Precautions)  Once,   STAT       Question Answer Comment  Is this test for diagnosis or screening Screening   Symptomatic for COVID-19 as defined by CDC No   Hospitalized for COVID-19 No   Admitted to ICU for COVID-19 No   Previously tested for COVID-19 No   Resident in a congregate (group) care setting No   Employed in healthcare setting No   Pregnant No   Has patient completed COVID vaccination(s) (2 doses of Pfizer/Moderna 1 dose of [employer]) Unknown      09/29/20 1953          Vitals/Pain Today's Vitals   09/29/20 2100 09/29/20 2115 09/29/20 2130 09/29/20 2200  BP: (!) 182/79  (!) 172/76 (!) 182/93  Pulse: (!) 105  (!) 105 (!) 107  Resp: 14  13 (!) 23  Temp:      TempSrc:      SpO2: 95%  (!) 87% 96%  Weight:      Height:      PainSc:  10-Worst pain ever      Isolation Precautions No active isolations  Medications Medications  amLODipine (NORVASC) tablet 5 mg (has no administration in time  range)  rosuvastatin (CRESTOR) tablet 40 mg (has no administration in time range)  buPROPion (WELLBUTRIN XL) 24 hr tablet 300 mg (has no administration in time range)  levothyroxine (SYNTHROID) tablet 50 mcg (has no administration in time range)  umeclidinium-vilanterol (ANORO ELLIPTA) 62.5-25 MCG/INH 1 puff (has no administration in time range)  albuterol (VENTOLIN HFA) 108 (90 Base) MCG/ACT inhaler 2 puff (has no administration in time range)  morphine 2 MG/ML injection 0.5 mg (has no administration in time range)  methocarbamol (ROBAXIN) tablet 500 mg (has no administration in time range)    Or  methocarbamol (ROBAXIN) 500 mg in dextrose 5 % 50 mL IVPB (has no administration in time range)  insulin aspart (novoLOG) injection 0-9 Units (2 Units Subcutaneous Given 09/29/20 2158)  insulin glargine (LANTUS) injection 10 Units (has no administration in time range)  sodium chloride 0.9 % bolus 500 mL (0 mLs Intravenous Stopped 09/29/20 2004)  HYDROmorphone (DILAUDID) injection 0.5 mg (0.5 mg Intravenous Given 09/29/20 1849)  ondansetron (ZOFRAN) injection 4 mg (4 mg Intravenous Given 09/29/20 1849)  sodium chloride 0.9 % bolus 1,000 mL (0 mLs Intravenous Stopped 09/30/20 0003)  HYDROmorphone (DILAUDID) injection 1 mg (1 mg Intravenous Given 09/29/20 2119)    Mobility non-ambulatory

## 2020-09-30 NOTE — Op Note (Signed)
OPERATIVE REPORT  SURGEON: Samson Frederic, MD   ASSISTANT: Barrie Dunker, PA-C.  PREOPERATIVE DIAGNOSIS: Left intertrochanteric femur fracture.   POSTOPERATIVE DIAGNOSIS: Left intertrochanteric femur fracture.   PROCEDURE: Intramedullary fixation, Left femur.   IMPLANTS: Biomet Affixus Hip Fracture Nail, 11 by 180 mm, 130 degrees. 10.5 x 90 mm Hip Fracture Nail Lag Screw. 5 x 34 mm distal interlocking screw 1.  ANESTHESIA:  General  ESTIMATED BLOOD LOSS:-50 mL    ANTIBIOTICS: 2 g Ancef.  DRAINS: None.  COMPLICATIONS: None.   CONDITION: PACU - hemodynamically stable.   BRIEF CLINICAL NOTE: [name] is a 75 y.o. female who presented with an intertrochanteric femur fracture. The patient was admitted to the hospitalist service and underwent perioperative risk stratification and medical optimization. The risks, benefits, and alternatives to the procedure were explained, and the patient elected to proceed.  PROCEDURE IN DETAIL: Surgical site was marked by myself. The patient was taken to the operating room and anesthesia was induced on the bed. The patient was then transferred to the [facility] table and the nonoperative lower extremity was scissored underneath the operative side. The fracture was reduced with traction, internal rotation, and adduction. The hip was prepped and draped in the normal sterile surgical fashion. Timeout was called verifying side and site of surgery. Preop antibiotics were given with 60 minutes of beginning the procedure.  Fluoroscopy was used to define the patient's anatomy. A 4 cm incision was made just proximal to the tip of the greater trochanter. The awl was used to obtain the standard starting point for a trochanteric entry nail under fluoroscopic control. The guidepin was placed. The entry reamer was used to open the proximal femur.  On the back table, the nail was assembled onto the jig. The nail was placed into the femur without any difficulty.  Through a separate stab incision, the cannula was placed down to the bone in preparation for the cephalomedullary device. A guidepin was placed into the femoral head using AP and lateral fluoroscopy views. The pin was measured, and then reaming was performed to the appropriate depth. The lag screw was inserted to the appropriate depth. The fracture was compressed through the jig. The setscrew was tightened and then loosened one quarter turn. A separate stab incision was created, and the distal interlocking screw was placed using standard AO technique. The jig was removed. Final AP and lateral fluoroscopy views were obtained to confirm fracture reduction and hardware placement. Tip apex distance was appropriate. There was no chondral penetration.  The wounds were copiously irrigated with saline. The wound was closed in layers with #1 Vicryl for the fascia, 2-0 Monocryl for the deep dermal layer, and skin staples. Glue was applied to the skin. Once the glue was fully hardened, sterile dressing was applied. The patient was then awakened from anesthesia and taken to the PACU in stable condition. Sponge needle and instrument counts were correct at the end of the case 2. There were no known complications.  We will readmit the patient to the hospitalist. Weightbearing status will be weightbearing as tolerated with a walker. We will begin Lovenox for DVT prophylaxis. The patient will work with physical therapy and undergo disposition planning.  Please note that a surgical assistant was a medical necessity for this procedure to perform it in a safe and expeditious manner. Assistant was necessary to provide appropriate retraction of vital neurovascular structures, to prevent femoral fracture, and to allow for anatomic placement of the prosthesis.

## 2020-09-30 NOTE — Plan of Care (Signed)

## 2020-09-30 NOTE — Anesthesia Procedure Notes (Signed)
Procedure Name: Intubation Date/Time: 09/30/2020 8:38 AM Performed by: Janene Harvey, CRNA Pre-anesthesia Checklist: Patient identified, Emergency Drugs available, Suction available and Patient being monitored Patient Re-evaluated:Patient Re-evaluated prior to induction Oxygen Delivery Method: Circle system utilized Preoxygenation: Pre-oxygenation with 100% oxygen Induction Type: IV induction Ventilation: Mask ventilation without difficulty Laryngoscope Size: Mac and 4 Grade View: Grade I Tube type: Oral Tube size: 7.0 mm Number of attempts: 1 Airway Equipment and Method: Stylet and Oral airway Placement Confirmation: ETT inserted through vocal cords under direct vision,  positive ETCO2 and breath sounds checked- equal and bilateral Secured at: 22 cm Tube secured with: Tape Dental Injury: Teeth and Oropharynx as per pre-operative assessment

## 2020-09-30 NOTE — ED Notes (Signed)
Report given to [facility]. 

## 2020-09-30 NOTE — Progress Notes (Signed)
Arrived to [facility] via [facility] from [facility].Marland Kitchen C/O left hip pain, denies nausea. Will continue to monitor.

## 2020-09-30 NOTE — ED Notes (Signed)
[facility] called for transport to [facility] 

## 2020-09-30 NOTE — Discharge Instructions (Signed)
 Dr. Heriberto Stmartin Adult Hip & Knee Specialist [facility] [address] [place], [place] [address] [phone]   POSTOPERATIVE DIRECTIONS    Hip Rehabilitation, Guidelines Following Surgery   WEIGHT BEARING Weight bearing as tolerated with assist device (walker, cane, etc) as directed, use it as long as suggested by your surgeon or therapist, typically at least 4-6 weeks.   HOME CARE INSTRUCTIONS  Remove items at home which could result in a fall. This includes throw rugs or furniture in walking pathways.  Continue medications as instructed at time of discharge.  You may have some home medications which will be placed on hold until you complete the course of blood thinner medication.  4 days after discharge, you may start showering. No tub baths or soaking your incisions. Do not put on socks or shoes without following the instructions of your caregivers.   Sit on chairs with arms. Use the chair arms to help push yourself up when arising.  Arrange for the use of a toilet seat elevator so you are not sitting low.   Walk with walker as instructed.  You may resume a sexual relationship in one month or when given the OK by your caregiver.  Use walker as long as suggested by your caregivers.  Avoid periods of inactivity such as sitting longer than an hour when not asleep. This helps prevent blood clots.  You may return to work once you are cleared by your surgeon.  Do not drive a car for 6 weeks or until released by your surgeon.  Do not drive while taking narcotics.  Wear elastic stockings for two weeks following surgery during the day but you may remove then at night.  Make sure you keep all of your appointments after your operation with all of your doctors and caregivers. You should call the office at the above phone number and make an appointment for approximately two weeks after the date of your surgery. Please pick up a stool softener and laxative  for home use as long as you are requiring pain medications.  ICE to the affected hip every three hours for 30 minutes at a time and then as needed for pain and swelling. Continue to use ice on the hip for pain and swelling from surgery. You may notice swelling that will progress down to the foot and ankle.  This is normal after surgery.  Elevate the leg when you are not up walking on it.   It is important for you to complete the blood thinner medication as prescribed by your doctor.  Continue to use the breathing machine which will help keep your temperature down.  It is common for your temperature to cycle up and down following surgery, especially at night when you are not up moving around and exerting yourself.  The breathing machine keeps your lungs expanded and your temperature down.  RANGE OF MOTION AND STRENGTHENING EXERCISES  These exercises are designed to help you keep full movement of your hip joint. Follow your caregiver's or physical therapist's instructions. Perform all exercises about fifteen times, three times per day or as directed. Exercise both hips, even if you have had only one joint replacement. These exercises can be done on a training (exercise) mat, on the floor, on a table or on a bed. Use whatever works the best and is most comfortable for you. Use music or television while you are exercising so that the exercises are a pleasant break in your day. This   will make your life better with the exercises acting as a break in routine you can look forward to.  Lying on your back, slowly slide your foot toward your buttocks, raising your knee up off the floor. Then slowly slide your foot back down until your leg is straight again.  Lying on your back spread your legs as far apart as you can without causing discomfort.  Lying on your side, raise your upper leg and foot straight up from the floor as far as is comfortable. Slowly lower the leg and repeat.  Lying on your back, tighten up the  muscle in the front of your thigh (quadriceps muscles). You can do this by keeping your leg straight and trying to raise your heel off the floor. This helps strengthen the largest muscle supporting your knee.  Lying on your back, tighten up the muscles of your buttocks both with the legs straight and with the knee bent at a comfortable angle while keeping your heel on the floor.   SKILLED REHAB INSTRUCTIONS: If the patient is transferred to a skilled rehab facility following release from the hospital, a list of the current medications will be sent to the facility for the patient to continue.  When discharged from the skilled rehab facility, please have the facility set up the patient's Home Health Physical Therapy prior to being released. Also, the skilled facility will be responsible for providing the patient with their medications at time of release from the facility to include their pain medication and their blood thinner medication. If the patient is still at the rehab facility at time of the two week follow up appointment, the skilled rehab facility will also need to assist the patient in arranging follow up appointment in our office and any transportation needs.  MAKE SURE YOU:  Understand these instructions.  Will watch your condition.  Will get help right away if you are not doing well or get worse.  Pick up stool softner and laxative for home use following surgery while on pain medications. Daily dry dressing changes as needed. In 4 days, you may remove your dressings and begin taking showers - no tub baths or soaking the incisions. Continue to use ice for pain and swelling after surgery. Do not use any lotions or creams on the incision until instructed by your surgeon.   

## 2020-09-30 NOTE — Progress Notes (Signed)
Initial Nutrition Assessment  DOCUMENTATION CODES:   Not applicable  INTERVENTION:   -Once diet is advanced, add:   -Glucerna Shake po TID, each supplement provides 220 kcal and 10 grams of protein -MVI with minerals daily  NUTRITION DIAGNOSIS:   Increased nutrient needs related to post-op healing as evidenced by estimated needs.  GOAL:   Patient will meet greater than or equal to 90% of their needs  MONITOR:   PO intake,Supplement acceptance,Diet advancement,Labs,Weight trends,Skin,I & O's  REASON FOR ASSESSMENT:   Consult Assessment of nutrition requirement/status,Hip fracture protocol  ASSESSMENT:   [name] is a 75 y.o. female with history of hypertension, diabetes mellitus, COPD, TIA, Crohn's disease per the chart had a fall at home when patient was picking up groceries and coming back home.  Patient states she slipped on the mat and fell on the floor denies hitting head or losing consciousness.  Pt admitted with lt hip fracture s/p mechanical fall.   1/28- s/p PROCEDURE: Intramedullary fixation, Left femur.   Reviewed I/O's: +1.5 L x 24 hours  Pt unavailable at time of visit.   Pt currently NPO for procedure. No meal completion records to assess at this time.   Reviewed wt hx; noted history of distant mild wt loss.   Pt with increased nutritional needs for post-operative healing and would benefit from addition of oral nutrition supplements.   Medications reviewed and include lactated ringers infusion @ 10 ml/hr.   Lab Results  Component Value Date   HGBA1C (H) 10/21/2009    7.9 (NOTE) The ADA recommends the following therapeutic goal for glycemic control related to Hgb A1c measurement: Goal of therapy: <6.5 Hgb A1c  Reference: American Diabetes Association: Clinical Practice Recommendations 2010, Diabetes Care, 2010, 33: (Suppl  1).   PTA DM medications are 5-11 units insulin regular TID, 28 units insulin regular BID, and 28 units insulin regular at  bedtime.   Labs reviewed: CBGS: 145-212 (inpatient orders for glycemic control are 0-9 units insulin aspart every 4 hours).   Diet Order:   Diet Order            Diet NPO time specified  Diet effective midnight                 EDUCATION NEEDS:   No education needs have been identified at this time  Skin:  Skin Assessment: Skin Integrity Issues: Skin Integrity Issues:: Incisions Incisions: closed lt thigh  Last BM:  Unknown  Height:   Ht Readings from Last 1 Encounters:  09/29/20 5\' 4"  (1.626 m)    Weight:   Wt Readings from Last 1 Encounters:  09/29/20 67.1 kg    Ideal Body Weight:  54.5 kg  BMI:  Body mass index is 25.4 kg/m.  Estimated Nutritional Needs:   Kcal:  1800-2000  Protein:  90-105 grams  Fluid:  > 1.8 L    10/01/20, RD, LDN, CDCES Registered Dietitian II Certified Diabetes Care and Education Specialist Please refer to [facility] for RD and/or RD on-call/weekend/after hours pager

## 2020-09-30 NOTE — Consult Note (Signed)
ORTHOPAEDIC CONSULTATION  REQUESTING PHYSICIAN: Lanae Boast, MD  PCP:  Georgianne Fick, MD  Chief Complaint: Left hip injury  HPI: [name] is a 75 y.o. female with a past medical history of hypertension, diabetes, COPD, TIA, and Crohn's disease who had a mechanical fall at home. She tripped on the mat while bringing groceries into the house. She fell onto her left side. She had left hip pain and inability to weight-bear. She was brought to the emergency department at [facility], where x-rays revealed a left intertrochanteric femur fracture. She was then transferred to [facility] for definitive management. She was admitted by the hospitalist service for perioperative or stratification medical optimization. She denies other injuries.  Past Medical History:  Diagnosis Date  . Anemia   . Crohn's colitis (HCC)   . Diabetes mellitus    age 6's  . GERD (gastroesophageal reflux disease)   . Hypercholesterolemia   . Hypertension   . Osteopenia   . Osteopenia   . SBO (small bowel obstruction) (HCC) 09/13/15  . TIA (transient ischemic attack)    Past Surgical History:  Procedure Laterality Date  . COLON SURGERY  07/08/2007  . HERNIA REPAIR  11/21/2009  . ILEOSTOMY  02/26/2008   reversal  . TEE WITHOUT CARDIOVERSION N/A 11/10/2015   Procedure: TRANSESOPHAGEAL ECHOCARDIOGRAM (TEE);  Surgeon: Thurmon Fair, MD;  Location: [facility] ENDOSCOPY;  Service: Cardiovascular;  Laterality: N/A;   Social History   Socioeconomic History  . Marital status: Married    Spouse name: Not on file  . Number of children: Not on file  . Years of education: Not on file  . Highest education level: Not on file  Occupational History  . Not on file  Tobacco Use  . Smoking status: Current Every Day Smoker    Types: Cigarettes  . Smokeless tobacco: Never Used  . Tobacco comment: 09/26/15 5 cigs daily  Vaping Use  . Vaping Use: Never used  Substance and Sexual Activity  . Alcohol use: No     Alcohol/week: 0.0 standard drinks  . Drug use: No  . Sexual activity: Not on file  Other Topics Concern  . Not on file  Social History Narrative  . Not on file   Social Determinants of Health   Financial Resource Strain: Not on file  Food Insecurity: Not on file  Transportation Needs: Not on file  Physical Activity: Not on file  Stress: Not on file  Social Connections: Not on file   Family History  Problem Relation Age of Onset  . Stroke Mother   . Heart attack Father   . Hypertension Daughter   . Diabetes Brother    Allergies  Allergen Reactions  . Simvastatin Other (See Comments)    Muscle cramps   Prior to Admission medications   Medication Sig Start Date End Date Taking? Authorizing Provider  amLODipine (NORVASC) 5 MG tablet Take 5 mg by mouth daily.   Yes [provider]  aspirin 81 MG tablet Take 81 mg by mouth daily.   Yes [provider]  Biotin 5000 MCG TABS Take 5,000 mcg by mouth daily.    Yes [provider]  buPROPion (WELLBUTRIN XL) 300 MG 24 hr tablet Take 300 mg by mouth daily.    Yes [provider]  CALCIUM PO Take 1 tablet by mouth daily.   Yes [provider]  Cholecalciferol (VITAMIN D PO) Take 800 Units by mouth daily.    Yes [provider]  CINNAMON PO Take 500 mg by mouth daily.   Yes [provider]  fish oil-omega-3 fatty acids 1000 MG capsule Take 1 g by mouth daily.   Yes [provider]  insulin regular (NOVOLIN R,HUMULIN R) 100 units/mL injection Inject 5-11 Units into the skin 3 (three) times daily as needed for high blood sugar. Sliding Scale: If Blood Sugar is <150 Take 5 units, If Blood Sugar Between 150-200-Take 11 units   Yes [provider]  levothyroxine (SYNTHROID, LEVOTHROID) 50 MCG tablet Take 50 mcg by mouth daily. 04/13/18  Yes [provider]  NOVOLIN N 100 UNIT/ML injection Inject 18-28 Units into the skin in the morning, at noon, and at  bedtime. Take 28 units in BID & Take 18 units at bedtime 09/13/20  Yes [provider]  rosuvastatin (CRESTOR) 40 MG tablet Take 40 mg by mouth daily. 07/25/20  Yes [provider]  vitamin E 100 UNIT capsule Take 100 Units by mouth daily.   Yes [provider]  ANORO ELLIPTA 62.5-25 MCG/INH AEPB Inhale 1 puff into the lungs daily as needed (sob/wheezing). 04/16/20   [provider]  colchicine 0.6 MG tablet Take 1 tablet (0.6 mg total) by mouth every 4 (four) hours as needed. May cause diarrhea Patient not taking: No sig reported 03/17/19   Tanda Rockers, PA-C  docusate sodium (COLACE) 100 MG capsule Take 1 capsule (100 mg total) by mouth every 12 (twelve) hours. Patient not taking: No sig reported 06/23/18   Arthor Captain, PA-C  furosemide (LASIX) 20 MG tablet Take 20 mg by mouth daily as needed for fluid.    [provider]  HYDROcodone-acetaminophen (NORCO/VICODIN) 5-325 MG tablet Take 1 tablet by mouth every 4 (four) hours as needed. Patient not taking: Reported on 09/29/2020 06/22/18   Linwood Dibbles, MD  HYDROcodone-acetaminophen (NORCO/VICODIN) 5-325 MG tablet Take 2 tablets by mouth every 4 (four) hours as needed. Patient not taking: No sig reported 03/17/19   Tanda Rockers, PA-C  oxyCODONE-acetaminophen (PERCOCET) 5-325 MG tablet Take 1-2 tablets by mouth every 6 (six) hours as needed for severe pain. Patient not taking: No sig reported 06/23/18   Arthor Captain, PA-C  VENTOLIN HFA 108 (90 Base) MCG/ACT inhaler Inhale 2 puffs into the lungs every 6 (six) hours as needed for wheezing or shortness of breath. 05/18/20   [provider]   DG Chest 1 View  Result Date: 09/29/2020 CLINICAL DATA:  Patient status post fall. EXAM: CHEST  1 VIEW COMPARISON:  Chest radiograph 06/21/2015. FINDINGS: Monitoring leads overlie the patient. Stable cardiac and mediastinal contours. No large area pulmonary consolidation. No pleural effusion or  pneumothorax. IMPRESSION: No acute cardiopulmonary process. Electronically Signed   By: Annia Belt M.D.   On: 09/29/2020 18:45   DG HIP UNILAT W OR W/O PELVIS 2-3 VIEWS LEFT  Result Date: 09/29/2020 CLINICAL DATA:  Larey Seat, left leg pain EXAM: LEFT FEMUR 2 VIEWS; DG HIP (WITH OR WITHOUT PELVIS) 2-3V LEFT COMPARISON:  None. FINDINGS: Left hip: Frontal view of the pelvis as well as frontal and cross-table lateral views of the left hip are obtained. There is a minimally displaced intertrochanteric left hip fracture without significant angulation. The hips remain well aligned. Prominent degenerative changes lower lumbar spine. Left femur: Frontal and lateral views demonstrate intertrochanteric left hip fracture which is minimally displaced. No other acute bony abnormalities. 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 IMPRESSION: 1. Intertrochanteric left hip fracture. Fracture  is minimally displaced without significant angulation. Electronically Signed   By: Sharlet Salina M.D.   On: 09/29/2020 18:47   DG Femur Min 2 Views Left  Result Date: 09/29/2020 CLINICAL DATA:  Larey Seat, left leg pain EXAM: LEFT FEMUR 2 VIEWS; DG HIP (WITH OR WITHOUT PELVIS) 2-3V LEFT COMPARISON:  None. FINDINGS: Left hip: Frontal view of the pelvis as well as frontal and cross-table lateral views of the left hip are obtained. There is a minimally displaced intertrochanteric left hip fracture without significant angulation. The hips remain well aligned. Prominent degenerative changes lower lumbar spine. Left femur: Frontal and lateral views demonstrate intertrochanteric left hip fracture which is minimally displaced. No other acute bony abnormalities. 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 IMPRESSION: 1. Intertrochanteric left hip fracture. Fracture is minimally displaced without significant angulation. Electronically  Signed   By: Sharlet Salina M.D.   On: 09/29/2020 18:47    Positive ROS: All other systems have been reviewed and were otherwise negative with the exception of those mentioned in the HPI and as above.  Physical Exam: General: Alert, no acute distress Cardiovascular: No pedal edema Respiratory: No cyanosis, no use of accessory musculature GI: No organomegaly, abdomen is soft and non-tender Skin: No lesions in the area of chief complaint Neurologic: Sensation intact distally Psychiatric: Patient is competent for consent with normal mood and affect Lymphatic: No axillary or cervical lymphadenopathy  MUSCULOSKELETAL: Examination of the left lower extremity reveals no skin wounds or lesions. She is shortened and externally rotated. Pain with attempted logrolling of the hip. Positive motor function dorsiflexion, plantarflexion, great toe extension. Her foot is well-perfused. She reports intact sensation.  Assessment: Left intertrochanteric femur fracture Tobacco abuse  Plan: I discussed the findings with the patient. Fracture will require surgical stabilization for pain control and mobilization. We discussed the risk, benefits, and alternatives to intramedullary fixation of the left femur. Please see statement of risk. Recommend smoking cessation. Plan for surgery today. All questions solicited and answered.  The risks, benefits, and alternatives were discussed with the patient. There are risks associated with the surgery including, but not limited to, problems with anesthesia (death), infection, differences in leg length/angulation/rotation, fracture of bones, loosening or failure of implants, malunion, nonunion, hematoma (blood accumulation) which may require surgical drainage, blood clots, pulmonary embolism, nerve injury (foot drop), and blood vessel injury. The patient understands these risks and elects to proceed.   Jonette Pesa, MD [phone]    09/30/2020 8:20 AM

## 2020-09-30 NOTE — Transfer of Care (Signed)
Immediate Anesthesia Transfer of Care Note  Patient: [name]  Procedure(s) Performed: INTRAMEDULLARY (IM) NAIL (Left Leg Upper)  Patient Location: PACU  Anesthesia Type:General  Level of Consciousness: drowsy and patient cooperative  Airway & Oxygen Therapy: Patient Spontanous Breathing and Patient connected to face mask oxygen  Post-op Assessment: Report given to RN and Post -op Vital signs reviewed and stable  Post vital signs: Reviewed  Last Vitals:  Vitals Value Taken Time  BP 174/73 09/30/20 1007  Temp    Pulse 89 09/30/20 1007  Resp 21 09/30/20 1007  SpO2 100 % 09/30/20 1007  Vitals shown include unvalidated device data.  Last Pain:  Vitals:   09/30/20 0400  TempSrc: Oral  PainSc:       Patients Stated Pain Goal: 0 (09/29/20 2115)  Complications: No complications documented.

## 2020-09-30 NOTE — Progress Notes (Signed)
Orthopedic Tech Progress Note Patient Details:  [name] 12-Sep-1945 [id number] Helped RN get patient off bedpan and cleaned up for the day. Patient ID: [name], female   DOB: 11/27/1945, 75 y.o.   MRN: [id number]   [name] 09/30/2020, 3:12 PM

## 2020-10-01 ENCOUNTER — Other Ambulatory Visit: Payer: Self-pay

## 2020-10-01 LAB — GLUCOSE, CAPILLARY
Glucose-Capillary: 147 mg/dL — ABNORMAL HIGH (ref 70–99)
Glucose-Capillary: 160 mg/dL — ABNORMAL HIGH (ref 70–99)
Glucose-Capillary: 180 mg/dL — ABNORMAL HIGH (ref 70–99)
Glucose-Capillary: 186 mg/dL — ABNORMAL HIGH (ref 70–99)
Glucose-Capillary: 202 mg/dL — ABNORMAL HIGH (ref 70–99)
Glucose-Capillary: 253 mg/dL — ABNORMAL HIGH (ref 70–99)

## 2020-10-01 LAB — CBC
HCT: 30.4 % — ABNORMAL LOW (ref 36.0–46.0)
Hemoglobin: 9.7 g/dL — ABNORMAL LOW (ref 12.0–15.0)
MCH: 27.2 pg (ref 26.0–34.0)
MCHC: 31.9 g/dL (ref 30.0–36.0)
MCV: 85.4 fL (ref 80.0–100.0)
Platelets: 277 10*3/uL (ref 150–400)
RBC: 3.56 MIL/uL — ABNORMAL LOW (ref 3.87–5.11)
RDW: 21.4 % — ABNORMAL HIGH (ref 11.5–15.5)
WBC: 10.5 10*3/uL (ref 4.0–10.5)
nRBC: 0 % (ref 0.0–0.2)

## 2020-10-01 LAB — BASIC METABOLIC PANEL
Anion gap: 11 (ref 5–15)
BUN: 11 mg/dL (ref 8–23)
CO2: 25 mmol/L (ref 22–32)
Calcium: 9.1 mg/dL (ref 8.9–10.3)
Chloride: 102 mmol/L (ref 98–111)
Creatinine, Ser: 0.84 mg/dL (ref 0.44–1.00)
GFR, Estimated: >60 mL/min (ref >60)
Glucose, Bld: 148 mg/dL — ABNORMAL HIGH (ref 70–99)
Potassium: 3.7 mmol/L (ref 3.5–5.1)
Sodium: 138 mmol/L (ref 135–145)

## 2020-10-01 MED ORDER — OMEGA-3-ACID ETHYL ESTERS 1 G PO CAPS
1.0000 g | ORAL_CAPSULE | Freq: Every day | ORAL | Status: DC
  Administered 2020-10-01 – 2020-10-05 (×5): 1 g via ORAL
  Filled 2020-10-01 (×5): qty 1

## 2020-10-01 MED ORDER — ASPIRIN EC 81 MG PO TBEC
81.0000 mg | DELAYED_RELEASE_TABLET | Freq: Every day | ORAL | Status: DC
  Administered 2020-10-01 – 2020-10-05 (×5): 81 mg via ORAL
  Filled 2020-10-01 (×5): qty 1

## 2020-10-01 MED ORDER — VITAMIN D 25 MCG (1000 UNIT) PO TABS
1000.0000 [IU] | ORAL_TABLET | Freq: Every day | ORAL | Status: DC
  Administered 2020-10-01 – 2020-10-05 (×5): 1000 [IU] via ORAL
  Filled 2020-10-01 (×5): qty 1

## 2020-10-01 MED ORDER — BIOTIN 5000 MCG PO TABS: 5000.0000 ug | ORAL_TABLET | Freq: Every day | ORAL | Status: DC

## 2020-10-01 MED ORDER — UMECLIDINIUM-VILANTEROL 62.5-25 MCG/INH IN AEPB
1.0000 | INHALATION_SPRAY | Freq: Every day | RESPIRATORY_TRACT | Status: DC
  Administered 2020-10-03 – 2020-10-05 (×3): 1 via RESPIRATORY_TRACT
  Filled 2020-10-01 (×2): qty 14

## 2020-10-01 MED ORDER — VITAMIN E 45 MG (100 UNIT) PO CAPS
100.0000 [IU] | ORAL_CAPSULE | Freq: Every day | ORAL | Status: DC
  Administered 2020-10-01 – 2020-10-05 (×5): 100 [IU] via ORAL
  Filled 2020-10-01 (×5): qty 1

## 2020-10-01 MED ORDER — HYDRALAZINE HCL 25 MG PO TABS: 25.0000 mg | ORAL_TABLET | Freq: Three times a day (TID) | ORAL | Status: DC | PRN

## 2020-10-01 MED ORDER — CALCIUM CARBONATE 1250 (500 CA) MG PO TABS
1250.0000 mg | ORAL_TABLET | Freq: Every day | ORAL | Status: DC
  Administered 2020-10-01 – 2020-10-05 (×5): 1250 mg via ORAL
  Filled 2020-10-01 (×5): qty 1

## 2020-10-01 NOTE — Progress Notes (Signed)
PROGRESS NOTE    [name]  IHK:[id number] DOB: 12-Sep-1945 DOA: 09/29/2020 PCP: Georgianne Fick, MD   Chief Complaint  Patient presents with  . Leg Injury  . Fall  . Hip Pain  Brief Narrative: 70 old female with hypertension diabetes mellitus COPD, TIA, Crohn's disease who had a fall when picking up groceries after she slipped on the mat and fell on the floor, without loss of consciousness.  In the ED had left hip fracture seen on the x-rays orthopedic was consulted labs reviewed stable EKG with sinus tachycardia and LVH Covid negative.  Patient was admitted for operative intervention  Subjective: Worked with PT this morning,  she is on the bedside chair Overnight no fever, blood pressure on higher side, leukocytosis resolved.  Pain is moderate.    Assessment & Plan:  Closed left hip fracture initial encounter secondary to mechanical fall: s/p  IM fixation of left femur  1/28,  Cont  lovenox for DVT prophylaxis.  Continue PT OT.  Continue pain management/DVT prophylaxis outpatient and Rx to orthopedics. She likely needs SNF, she lives alone.  Type 2 diabetes mellitus, blood sugar well controlled, continue sliding scale insulin monitor CBG.   Recent Labs  Lab 09/30/20 1017 09/30/20 1149 09/30/20 1625 09/30/20 2019 10/01/20 0012  GLUCAP 158* 177* 229* 208* 160*   Hypertension poorly controlled likely due to postop/history of stent pain.  Continue amlodipine 5 mg.  Add as needed hydralazine., HER LASIX IS PRN FOR EDEMA AT HOME  Hyperlipidemia on statin  COPD not in exacerbation resume her Anoro Ellipta, continue albuterol.  Continue and encourage incentive spirometry   Nutrition: Diet Order            Diet Carb Modified Fluid consistency: Thin; Room service appropriate? Yes  Diet effective now                 Nutrition Problem: Increased nutrient needs Etiology: post-op healing Signs/Symptoms: estimated needs Interventions: MVI,Glucerna shake  Body mass  index is 25.4 kg/m.  DVT prophylaxis: enoxaparin (LOVENOX) injection 40 mg Start: 10/01/20 0800 SCDs Start: 09/30/20 1146 Code Status:   Code Status: Full Code  Family Communication: plan of care discussed with patient at bedside.  Status is: Inpatient Remains inpatient appropriate because:Hip fracture and need for surgical intervention  Dispo: The patient is from: Home  LIVES ALONE              Anticipated d/c is to: SNF              Anticipated d/c date is:1- 2 days              Patient currently is not medically stable to d/c.   Difficult to place patient No  Consultants:see note  Procedures:see note  Culture/Microbiology    Component Value Date/Time   SDES  06/22/2018 1430    FLUID Performed at [facility], [address] [place], [place] [address]    [facility]  06/22/2018 1430    RIGHT KNEE Performed at [facility], [address], [place], [place] [address]    CULT  06/22/2018 1154    NO GROWTH 5 DAYS Performed at [facility], [address], [place], [place] [address]    REPTSTATUS 06/22/2018 FINAL 06/22/2018 1430    Other culture-see note  Medications: Scheduled Meds: . amLODipine  5 mg Oral Daily  . buPROPion  300 mg Oral Daily  . docusate sodium  100 mg  Oral BID  . enoxaparin (LOVENOX) injection  40 mg Subcutaneous Q24H  . feeding supplement (GLUCERNA SHAKE)  237 mL Oral TID BM  . insulin aspart  0-9 Units Subcutaneous Q4H  . levothyroxine  50 mcg Oral Q0600  . multivitamin with minerals  1 tablet Oral Daily  . rosuvastatin  40 mg Oral Daily  . senna  1 tablet Oral BID   Continuous Infusions: . sodium chloride Stopped (09/30/20 1939)  . methocarbamol (ROBAXIN) IV      Antimicrobials: Anti-infectives (From admission, onward)   Start     Dose/Rate Route Frequency Ordered Stop   09/30/20 1430  ceFAZolin (ANCEF) IVPB 2g/100 mL premix        2 g 200 mL/hr over 30 Minutes Intravenous Every 6 hours 09/30/20 1146  09/30/20 2009   09/30/20 0830  ceFAZolin (ANCEF) IVPB 2g/100 mL premix        2 g 200 mL/hr over 30 Minutes Intravenous On call to O.R. 09/30/20 0815 09/30/20 0848   09/30/20 0820  ceFAZolin (ANCEF) 2-4 GM/100ML-% IVPB       Note to Pharmacy: Tawanna Sathang, Connie   : cabinet override      09/30/20 0820 09/30/20 0849     Objective: Vitals: Today's Vitals   09/30/20 2252 09/30/20 2351 10/01/20 0500 10/01/20 0600  BP:   (!) 168/74   Pulse:   97   Resp:   17   Temp:   98.6 F (37 C)   TempSrc:   Oral   SpO2:   100%   Weight:      Height:      PainSc: 6  2   8      Intake/Output Summary (Last 24 hours) at 10/01/2020 0751 Last data filed at 09/30/2020 2300 Gross per 24 hour  Intake 1309.1 ml  Output 50 ml  Net 1259.1 ml   Filed Weights   09/29/20 1802  Weight: 67.1 kg   Weight change:   Intake/Output from previous day: 01/28 0701 - 01/29 0700 In: 1309.1 [P.O.:360; I.V.:606.5; IV Piggyback:342.6] Out: 50 [Blood:50] Intake/Output this shift: No intake/output data recorded. Filed Weights   09/29/20 1802  Weight: 67.1 kg    Examination: General exam: AAOx3 , NAD, weak appearing. HEENT:Oral mucosa moist, Ear/Nose WNL grossly, dentition normal. Respiratory system: bilaterally mild wheezing ,no use of accessory muscle Cardiovascular system: S1 & S2 +, No JVD,. Gastrointestinal system: Abdomen soft, NT,ND, BS+ Nervous System:Alert, awake, moving extremities and grossly nonfocal Extremities: Hip surgical site with Aquacel dressing in place no edema, distal peripheral pulses palpable.  Skin: No rashes,no icterus. MSK: Normal muscle bulk,tone, power   Data Reviewed: I have personally reviewed following labs and imaging studies CBC: Recent Labs  Lab 09/29/20 1850 09/30/20 1225 10/01/20 0156  WBC 9.2 14.3* 10.5  HGB 12.4 10.8* 9.7*  HCT 41.4 35.8* 30.4*  MCV 87.2 86.9 85.4  PLT 336 310 277   Basic Metabolic Panel: Recent Labs  Lab 09/29/20 1850 09/30/20 1225  10/01/20 0156  NA 139  --  138  K 3.9  --  3.7  CL 100  --  102  CO2 26  --  25  GLUCOSE 204*  --  148*  BUN 22  --  11  CREATININE 0.98 0.87 0.84  CALCIUM 9.7  --  9.1   GFR: Estimated Creatinine Clearance: 55.4 mL/min (by C-G formula based on SCr of 0.84 mg/dL). Liver Function Tests: No results for input(s): AST, ALT, ALKPHOS, BILITOT, PROT, ALBUMIN in the last  168 hours. No results for input(s): LIPASE, AMYLASE in the last 168 hours. No results for input(s): AMMONIA in the last 168 hours. Coagulation Profile: No results for input(s): INR, PROTIME in the last 168 hours. Cardiac Enzymes: No results for input(s): CKTOTAL, CKMB, CKMBINDEX, TROPONINI in the last 168 hours. BNP (last 3 results) No results for input(s): PROBNP in the last 8760 hours. HbA1C: No results for input(s): HGBA1C in the last 72 hours. CBG: Recent Labs  Lab 09/30/20 1017 09/30/20 1149 09/30/20 1625 09/30/20 2019 10/01/20 0012  GLUCAP 158* 177* 229* 208* 160*   Lipid Profile: No results for input(s): CHOL, HDL, LDLCALC, TRIG, CHOLHDL, LDLDIRECT in the last 72 hours. Thyroid Function Tests: No results for input(s): TSH, T4TOTAL, FREET4, T3FREE, THYROIDAB in the last 72 hours. Anemia Panel: No results for input(s): VITAMINB12, FOLATE, FERRITIN, TIBC, IRON, RETICCTPCT in the last 72 hours. Sepsis Labs: No results for input(s): PROCALCITON, LATICACIDVEN in the last 168 hours.  Recent Results (from the past 240 hour(s))  SARS CORONAVIRUS 2 (TAT 6-24 HRS) Nasopharyngeal Nasopharyngeal Swab     Status: None   Collection Time: 09/29/20  9:00 PM   Specimen: Nasopharyngeal Swab  Result Value Ref Range Status   SARS Coronavirus 2 NEGATIVE NEGATIVE Final    Comment: (NOTE) SARS-CoV-2 target nucleic acids are NOT DETECTED.  The SARS-CoV-2 RNA is generally detectable in upper and lower respiratory specimens during the acute phase of infection. Negative results do not preclude SARS-CoV-2 infection, do not  rule out co-infections with other pathogens, and should not be used as the sole basis for treatment or other patient management decisions. Negative results must be combined with clinical observations, patient history, and epidemiological information. The expected result is Negative.  Fact Sheet for Patients: HairSlick.no  Fact Sheet for Healthcare Providers: quierodirigir.com  This test is not yet approved or cleared by the [place] FDA and  has been authorized for detection and/or diagnosis of SARS-CoV-2 by FDA under an Emergency Use Authorization (EUA). This EUA will remain  in effect (meaning this test can be used) for the duration of the COVID-19 declaration under Se ction 564(b)(1) of the Act, 21 U.S.C. section 360bbb-3(b)(1), unless the authorization is terminated or revoked sooner.  Performed at [facility], [address] [place], [place] [address]   Surgical pcr screen     Status: None   Collection Time: 09/30/20  2:13 AM   Specimen: Nasal Mucosa; Nasal Swab  Result Value Ref Range Status   MRSA, PCR NEGATIVE NEGATIVE Final   Staphylococcus aureus NEGATIVE NEGATIVE Final    Comment: (NOTE) The Xpert SA Assay (FDA approved for NASAL specimens in patients 42 years of age and older), is one component of a comprehensive surveillance program. It is not intended to diagnose infection nor to guide or monitor treatment. Performed at [facility], [address], [place], [place] [address]      Radiology Studies: DG Chest 1 View  Result Date: 09/29/2020 CLINICAL DATA:  Patient status post fall. EXAM: CHEST  1 VIEW COMPARISON:  Chest radiograph 06/21/2015. FINDINGS: Monitoring leads overlie the patient. Stable cardiac and mediastinal contours. No large area pulmonary consolidation. No pleural effusion or pneumothorax. IMPRESSION: No acute cardiopulmonary process. Electronically Signed   By: Annia Belt  M.D.   On: 09/29/2020 18:45   Pelvis Portable  Result Date: 09/30/2020 CLINICAL DATA:  Left femur ORIF. EXAM: PORTABLE PELVIS 1-2 VIEWS COMPARISON:  Left hip x-rays from same day and yesterday. FINDINGS:  Interval gamma nail fixation of the essentially nondisplaced left intertrochanteric femur fracture. Alignment is near anatomic. No evidence of hardware failure or loosening. Postsurgical changes in the surrounding soft tissues. IMPRESSION: 1. Interval left intertrochanteric femur fracture ORIF without acute postoperative complication. Electronically Signed   By: Obie Dredge M.D.   On: 09/30/2020 10:46   DG C-Arm 1-60 Min  Result Date: 09/30/2020 CLINICAL DATA:  Status post ORIF of proximal left femur fracture. EXAM: DG C-ARM 1-60 MIN; OPERATIVE LEFT HIP WITH PELVIS COMPARISON:  09/29/2020 FLUOROSCOPY TIME:  Fluoroscopy Time:  1 minutes 4 seconds Radiation Exposure Index (if provided by the fluoroscopic device): 10.43 mGy Number of Acquired Spot Images: 0 FINDINGS: Four images from portable C-arm radiography obtained in the operating room show open reduction and internal fixation of the intertrochanteric fracture involving the proximal left femur. IMPRESSION: 1. Status post ORIF of proximal left femur fracture. Electronically Signed   By: Signa Kell M.D.   On: 09/30/2020 10:08   DG HIP OPERATIVE UNILAT WITH PELVIS LEFT  Result Date: 09/30/2020 CLINICAL DATA:  Status post ORIF of proximal left femur fracture. EXAM: DG C-ARM 1-60 MIN; OPERATIVE LEFT HIP WITH PELVIS COMPARISON:  09/29/2020 FLUOROSCOPY TIME:  Fluoroscopy Time:  1 minutes 4 seconds Radiation Exposure Index (if provided by the fluoroscopic device): 10.43 mGy Number of Acquired Spot Images: 0 FINDINGS: Four images from portable C-arm radiography obtained in the operating room show open reduction and internal fixation of the intertrochanteric fracture involving the proximal left femur. IMPRESSION: 1. Status post ORIF of proximal left  femur fracture. Electronically Signed   By: Signa Kell M.D.   On: 09/30/2020 10:08   DG HIP UNILAT W OR W/O PELVIS 2-3 VIEWS LEFT  Result Date: 09/29/2020 CLINICAL DATA:  Larey Seat, left leg pain EXAM: LEFT FEMUR 2 VIEWS; DG HIP (WITH OR WITHOUT PELVIS) 2-3V LEFT COMPARISON:  None. FINDINGS: Left hip: Frontal view of the pelvis as well as frontal and cross-table lateral views of the left hip are obtained. There is a minimally displaced intertrochanteric left hip fracture without significant angulation. The hips remain well aligned. Prominent degenerative changes lower lumbar spine. Left femur: Frontal and lateral views demonstrate intertrochanteric left hip fracture which is minimally displaced. No other acute bony abnormalities. 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 IMPRESSION: 1. Intertrochanteric left hip fracture. Fracture is minimally displaced without significant angulation. Electronically Signed   By: Sharlet Salina M.D.   On: 09/29/2020 18:47   DG Femur Min 2 Views Left  Result Date: 09/29/2020 CLINICAL DATA:  Larey Seat, left leg pain EXAM: LEFT FEMUR 2 VIEWS; DG HIP (WITH OR WITHOUT PELVIS) 2-3V LEFT COMPARISON:  None. FINDINGS: Left hip: Frontal view of the pelvis as well as frontal and cross-table lateral views of the left hip are obtained. There is a minimally displaced intertrochanteric left hip fracture without significant angulation. The hips remain well aligned. Prominent degenerative changes lower lumbar spine. Left femur: Frontal and lateral views demonstrate intertrochanteric left hip fracture which is minimally displaced. No other acute bony abnormalities. 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 IMPRESSION: 1. Intertrochanteric left hip fracture. Fracture is minimally displaced without significant angulation. Electronically Signed   By: Sharlet Salina M.D.   On:  09/29/2020 18:47     LOS: 2 days   Lanae Boast, MD [facility]  10/01/2020, 7:51 AM

## 2020-10-01 NOTE — Plan of Care (Signed)

## 2020-10-01 NOTE — Plan of Care (Signed)

## 2020-10-01 NOTE — Evaluation (Signed)
Physical Therapy Evaluation Patient Details Name: [name] MRN: [id number] DOB: [birth year] Today's Date: 10/01/2020   History of Present Illness  75 y.o. female with a past medical history of hypertension, diabetes, COPD, TIA, and Crohn's disease who had a mechanical fall at home. Patient sustained L intertrochanteric femur fx. Patient s/p IM nail L femur on 1/28.  Clinical Impression  PTA, patient lives alone and reports independence. Session limited by pain. Patient presents with decreased activity tolerance, generalized weakness, impaired balance, and impaired functional mobility. Patient minA for bed mobility and sit to stand transfer with RW. Patient ambulated 4' with RW and min guard, cues for sequencing. Patient will benefit from skilled PT services during acute stay to address listed deficits. Recommend SNF at this time to maximize functional independence prior to d/c home due to patient living alone. If patient makes good progress with mobility, will update recommendation as appropriate.     Follow Up Recommendations SNF;Supervision/Assistance - 24 hour    Equipment Recommendations  [name];3in1 (PT)    Recommendations for Other Services       Precautions / Restrictions Precautions Precautions: Fall Restrictions Weight Bearing Restrictions: Yes LLE Weight Bearing: Weight bearing as tolerated      Mobility  Bed Mobility Overal bed mobility: Needs Assistance Bed Mobility: Supine to Sit     Supine to sit: Min assist     General bed mobility comments: minA for LLE and trunk advancement to EOB    Transfers Overall transfer level: Needs assistance Equipment used: Joanne Min Tunnell (2 wheeled) Transfers: Sit to/from Stand Sit to Stand: Min assist         General transfer comment: minA for boost up to standing, cues for hand placement  Ambulation/Gait Ambulation/Gait assistance: Min guard Gait Distance (Feet): 4 Feet Assistive device:  [name] (2 wheeled) Gait Pattern/deviations: Step-to pattern;Decreased stride length;Decreased stance time - left;Antalgic;Wide base of support Gait velocity: decreased   General Gait Details: distance limited by pain. Cues for sequencing and use of UEs to offweight LLE  Stairs            Wheelchair Mobility    Modified Rankin (Stroke Patients Only)       Balance Overall balance assessment: Needs assistance Sitting-balance support: Bilateral upper extremity supported;Feet supported Sitting balance-Leahy Scale: Fair     Standing balance support: Bilateral upper extremity supported;During functional activity Standing balance-Leahy Scale: Poor Standing balance comment: reliant on UE support                             Pertinent Vitals/Pain Pain Assessment: 0-10 Pain Score: 6  Pain Location: L hip Pain Descriptors / Indicators: Guarding;Grimacing Pain Intervention(s): Monitored during session;Repositioned    Home Living Family/patient expects to be discharged to:: Private residence Living Arrangements: Alone Available Help at Discharge: Family;Available PRN/intermittently;Friend(s) Type of Home: House Home Access: Stairs to enter   [employer] of Steps: 1 Home Layout: One level Home Equipment: None      Prior Function Level of Independence: Independent         Comments: retired but working part time     Higher education careers adviser        Extremity/Trunk Assessment   Upper Extremity Assessment Upper Extremity Assessment: Defer to OT evaluation    Lower Extremity Assessment Lower Extremity Assessment: Generalized weakness       Communication   Communication: No difficulties  Cognition Arousal/Alertness: Awake/alert Behavior During Therapy:  WFL for tasks assessed/performed Overall Cognitive Status: Within Functional Limits for tasks assessed                                        General Comments       Exercises     Assessment/Plan    PT Assessment Patient needs continued PT services  PT Problem List Decreased strength;Decreased range of motion;Decreased activity tolerance;Decreased mobility;Decreased balance       PT Treatment Interventions DME instruction;Stair training;Gait training;Functional mobility training;Therapeutic exercise;Balance training;Therapeutic activities;Patient/family education    PT Goals (Current goals can be found in the Care Plan section)  Acute Rehab PT Goals Patient Stated Goal: to go home PT Goal Formulation: With patient Time For Goal Achievement: 10/15/20 Potential to Achieve Goals: Good    Frequency Min 3X/week   Barriers to discharge        Co-evaluation               AM-PAC PT "6 Clicks" Mobility  Outcome Measure Help needed turning from your back to your side while in a flat bed without using bedrails?: A Little Help needed moving from lying on your back to sitting on the side of a flat bed without using bedrails?: A Little Help needed moving to and from a bed to a chair (including a wheelchair)?: A Little Help needed standing up from a chair using your arms (e.g., wheelchair or bedside chair)?: A Little Help needed to walk in hospital room?: A Little Help needed climbing 3-5 steps with a railing? : A Lot 6 Click Score: 17    End of Session Equipment Utilized During Treatment: Gait belt Activity Tolerance: Patient limited by pain Patient left: in chair;with call bell/phone within reach;with chair alarm set Nurse Communication: Mobility status PT Visit Diagnosis: Unsteadiness on feet (R26.81);Muscle weakness (generalized) (M62.81);History of falling (Z91.81);Other abnormalities of gait and mobility (R26.89)    Time: 1740-8144 PT Time Calculation (min) (ACUTE ONLY): 34 min   Charges:   PT Evaluation $PT Eval Low Complexity: 1 Low PT Treatments $Therapeutic Activity: 8-22 mins        Fleur Audino A. Dan Humphreys PT, DPT Acute  Rehabilitation Services Pager [phone] Office [phone]   Elissa Lovett 10/01/2020, 10:40 AM

## 2020-10-01 NOTE — Progress Notes (Signed)
   Subjective: 1 Day Post-Op Procedure(s) (LRB): INTRAMEDULLARY (IM) NAIL (Left)  Pt c/o mild pain to left thigh but no pain in her hip Denies any new symptoms or issues Pain is mild to moderate Patient reports pain as moderate.  Objective:   VITALS:   Vitals:   09/30/20 1900 10/01/20 0500  BP: (!) 148/70 (!) 168/74  Pulse: 100 97  Resp: 18 17  Temp: 98.8 F (37.1 C) 98.6 F (37 C)  SpO2: 98% 100%    Left lateral thigh/hip incisions healing well Dressing in place No drainage or signs of erythema nv intact distally   LABS Recent Labs    09/29/20 1850 09/30/20 1225 10/01/20 0156  HGB 12.4 10.8* 9.7*  HCT 41.4 35.8* 30.4*  WBC 9.2 14.3* 10.5  PLT 336 310 277    Recent Labs    09/29/20 1850 09/30/20 1225 10/01/20 0156  NA 139  --  138  K 3.9  --  3.7  BUN 22  --  11  CREATININE 0.98 0.87 0.84  GLUCOSE 204*  --  148*     Assessment/Plan: 1 Day Post-Op Procedure(s) (LRB): INTRAMEDULLARY (IM) NAIL (Left) Overall doing well PT/OT Pain management Pulmonary toilet D/c planning   Brad Antonieta Iba, MPAS [facility] now [facility]  [facility] [address], [place], [place] [address] Phone: [phone] [email or web address] Facebook  [employer]

## 2020-10-01 NOTE — Plan of Care (Signed)
  Problem: Education: Goal: Knowledge of General Education information will improve Description: Including pain rating scale, medication(s)/side effects and non-pharmacologic comfort measures 10/01/2020 0045 by Dahlia Bailiff, RN Outcome: Progressing 10/01/2020 0044 by Dahlia Bailiff, RN Outcome: Progressing   Problem: Health Behavior/Discharge Planning: Goal: Ability to manage health-related needs will improve 10/01/2020 0045 by Dahlia Bailiff, RN Outcome: Progressing 10/01/2020 0044 by Dahlia Bailiff, RN Outcome: Progressing   Problem: Clinical Measurements: Goal: Ability to maintain clinical measurements within normal limits will improve 10/01/2020 0045 by Dahlia Bailiff, RN Outcome: Progressing 10/01/2020 0044 by Dahlia Bailiff, RN Outcome: Progressing

## 2020-10-01 NOTE — Evaluation (Signed)
Occupational Therapy Evaluation Patient Details Name: [name] MRN: [id number] DOB: 08-Mar-1946 Today's Date: 10/01/2020    History of Present Illness 75 y.o. female with a past medical history of hypertension, diabetes, COPD, TIA, and Crohn's disease who had a mechanical fall at home. Patient sustained L intertrochanteric femur fx. Patient s/p IM nail L femur on 1/28.   Clinical Impression   Pt admitted s/p fall. Pt lives alone at this time and was working part time. Pt at this time required min assist for supine to sitting with bed mobility and min assist off standard surfaces. Pt completed tolieting with BSC with min assist to min guard due to decrease balance with BUE support. Pt educated on breathing techniques in session as holding breath with transfers. At this time recommending SNF but increase in progression may be able to change recommendations.  Pt currently with functional limitations due to the deficits listed below (see OT Problem List).  Pt will benefit from skilled OT to increase their safety and independence with ADL and functional mobility for ADL to facilitate discharge to venue listed below.       Follow Up Recommendations  SNF;Supervision/Assistance - 24 hour    Equipment Recommendations       Recommendations for Other Services       Precautions / Restrictions Precautions Precautions: Fall Restrictions Weight Bearing Restrictions: Yes LLE Weight Bearing: Weight bearing as tolerated      Mobility Bed Mobility Overal bed mobility: Needs Assistance Bed Mobility: Supine to Sit     Supine to sit: Min assist     General bed mobility comments: minA for LLE and trunk advancement to EOB    Transfers Overall transfer level: Needs assistance Equipment used: Rolling walker (2 wheeled) Transfers: Sit to/from Stand Sit to Stand: Min assist         General transfer comment: minA for boost up to standing, cues for hand placement    Balance Overall  balance assessment: Needs assistance Sitting-balance support: Bilateral upper extremity supported;Feet supported Sitting balance-Leahy Scale: Fair     Standing balance support: Bilateral upper extremity supported;During functional activity Standing balance-Leahy Scale: Poor Standing balance comment: reliant on UE support                           ADL either performed or assessed with clinical judgement   ADL Overall ADL's : Needs assistance/impaired Eating/Feeding: Independent;Sitting   Grooming: Wash/dry hands;Sitting;Set up   Upper Body Bathing: Set up;Sitting;Cueing for safety;Cueing for sequencing   Lower Body Bathing: Maximal assistance;Cueing for safety;Cueing for compensatory techniques;Sit to/from stand   Upper Body Dressing : Set up;Sitting;Cueing for safety;Cueing for sequencing   Lower Body Dressing: Maximal assistance;Cueing for sequencing;Cueing for compensatory techniques;Sit to/from stand   Toilet Transfer: BSC;Cueing for safety;Cueing for sequencing;Min guard   Toileting- Architect and Hygiene: Min guard;Cueing for safety;Cueing for sequencing;Sit to/from stand   Tub/ Engineer, structural: Maximal assistance;Cueing for safety;Cueing for sequencing   Functional mobility during ADLs: Min guard;Rolling walker;Cueing for sequencing;Cueing for safety General ADL Comments: due to pain limited ADLS at this time     Vision Patient Visual Report: No change from baseline Vision Assessment?: No apparent visual deficits     Perception     Praxis Praxis Praxis tested?: Not tested    Pertinent Vitals/Pain Pain Assessment: 0-10 Pain Score: 6  Pain Location: L hip Pain Descriptors / Indicators: Guarding;Grimacing Pain Intervention(s): Limited activity within patient's tolerance;Monitored during session;Repositioned  Hand Dominance     Extremity/Trunk Assessment Upper Extremity Assessment Upper Extremity Assessment: Overall WFL for tasks  assessed   Lower Extremity Assessment Lower Extremity Assessment: Generalized weakness       Communication Communication Communication: No difficulties   Cognition Arousal/Alertness: Awake/alert Behavior During Therapy: WFL for tasks assessed/performed Overall Cognitive Status: Within Functional Limits for tasks assessed                                     General Comments       Exercises     Shoulder Instructions      Home Living Family/patient expects to be discharged to:: Private residence Living Arrangements: Alone Available Help at Discharge: Family;Available PRN/intermittently;Friend(s) Type of Home: House Home Access: Stairs to enter [employer] of Steps: 1   Home Layout: One level     Bathroom Shower/Tub: Chief Strategy Officer: Standard     Home Equipment: None          Prior Functioning/Environment Level of Independence: Independent        Comments: retired but working part time        OT Problem List: Decreased strength;Decreased activity tolerance;Impaired balance (sitting and/or standing);Decreased safety awareness;Decreased knowledge of use of DME or AE;Pain      OT Treatment/Interventions: Self-care/ADL training;DME and/or AE instruction;Therapeutic activities;Patient/family education;Balance training    OT Goals(Current goals can be found in the care plan section) Acute Rehab OT Goals Patient Stated Goal: to go home OT Goal Formulation: With patient Time For Goal Achievement: 10/28/20 Potential to Achieve Goals: Fair ADL Goals Pt Will Perform Upper Body Dressing: with modified independence;sitting;standing Pt Will Perform Lower Body Dressing: with min guard assist;sit to/from stand Pt Will Transfer to Toilet: with min guard assist;ambulating;regular height toilet;grab bars Pt Will Perform Tub/Shower Transfer: with min assist;shower seat;ambulating  OT Frequency: Min 2X/week   Barriers to D/C:  Decreased caregiver support          Co-evaluation              AM-PAC OT "6 Clicks" Daily Activity     Outcome Measure Help from another person eating meals?: None Help from another person taking care of personal grooming?: A Little Help from another person toileting, which includes using toliet, bedpan, or urinal?: A Little Help from another person bathing (including washing, rinsing, drying)?: A Lot Help from another person to put on and taking off regular upper body clothing?: A Little Help from another person to put on and taking off regular lower body clothing?: A Little 6 Click Score: 18   End of Session Equipment Utilized During Treatment: Gait belt;Rolling walker  Activity Tolerance: Patient limited by pain Patient left: in chair;with chair alarm set;with call bell/phone within reach  OT Visit Diagnosis: Unsteadiness on feet (R26.81);History of falling (Z91.81);Pain Pain - Right/Left: Left Pain - part of body: Hip                Time: 7408-1448 OT Time Calculation (min): 39 min Charges:  OT General Charges $OT Visit: 1 Visit OT Evaluation $OT Eval Low Complexity: 1 Low OT Treatments $Self Care/Home Management : 8-22 mins  Alphia Moh OTR/L  Acute Rehab Services  [phone] office number [phone] pager number   Alphia Moh 10/01/2020, 1:33 PM

## 2020-10-01 NOTE — Plan of Care (Signed)

## 2020-10-01 NOTE — TOC Initial Note (Addendum)
Transition of Care [facility]) - Initial/Assessment Note    Patient Details  Name: [name] MRN: [id number] Date of Birth: [birth year]  Transition of Care [facility]) CM/SW Contact:    Epifanio Lesches, RN Phone Number: 10/01/2020, 10:10 AM  Clinical Narrative:                 Admitted s/p a fall, suffered L hip fx . Pt from home alone. Supportive daughter. States PTA independent with ADL's , no DME usage. 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  NCM spoke with pt in regard to d/c planning. Pt states she would like to return to home with home health services, hoping she will  progress and not need SNF level of care. She knows she lives alone and will need 24/7 supervision/assist discharging to home, however, is hoping daughter can assist with care short term. States she is going to speak with daughter concerning matter.   Pt states she is also going to speak with the admission liaison @ [facility] just in case SNF placement is needed @ d/c.    Pt gave NCM the ok to do workup for  SNF and  HH (as a backup).  Pt states without preference for home health services. Referral made with [facility] and accepted if [facility] services are needed @ d/c.   TOC team will continue to monitor and follow for needs.....      Expected Discharge Plan:  (Resides alone.) Barriers to Discharge: Continued Medical Work up   Patient Goals and CMS Choice     Choice offered to / list presented to : Patient  Expected Discharge Plan and Services Expected Discharge Plan:  (Resides alone.)   Discharge Planning Services: CM Consult                                          Prior Living Arrangements/Services   Lives with:: Self Patient language and need for interpreter reviewed:: Yes Do you feel safe going back to the place where you live?: Yes      Need for Family Participation in Patient Care: Yes (Comment) Care  giver support system in place?: Yes (comment)   Criminal Activity/Legal Involvement Pertinent to Current Situation/Hospitalization: No - Comment as needed  Activities of Daily Living Home Assistive Devices/Equipment: Eyeglasses (reading glasses) ADL Screening (condition at time of admission) Patient's cognitive ability adequate to safely complete daily activities?: Yes Is the patient deaf or have difficulty hearing?: No Does the patient have difficulty seeing, even when wearing glasses/contacts?: No Does the patient have difficulty concentrating, remembering, or making decisions?: No Patient able to express need for assistance with ADLs?: Yes Does the patient have difficulty dressing or bathing?: No Independently performs ADLs?: No Communication: Independent Dressing (OT): Independent Grooming: Independent Feeding: Independent Bathing: Independent Toileting: Needs assistance Is this a change from baseline?: Change from baseline, expected to last >3days In/Out Bed: Needs assistance Is this a change from baseline?: Change from baseline, expected to last >3 days Walks in Home: Needs assistance Is this a change from baseline?: Pre-admission baseline Does the patient have difficulty walking or climbing stairs?: Yes Weakness of Legs: Left Weakness of Arms/Hands: None  Permission Sought/Granted   Permission granted to share information with : Yes, Verbal Permission Granted  Emotional Assessment Appearance:: Appears stated age Attitude/Demeanor/Rapport: Gracious Affect (typically observed): Accepting Orientation: : Oriented to Self,Oriented to Place,Oriented to  Time,Oriented to Situation Alcohol / Substance Use: Not Applicable Psych Involvement: No (comment)  Admission diagnosis:  Closed left hip fracture (HCC) [S72.002A] Fall from slip, trip, or stumble, initial encounter [W01.0XXA] Closed left hip fracture, initial encounter (HCC) [S72.002A] Intertrochanteric  fracture of left hip, closed, initial encounter [facility]) [S72.142A] Patient Active Problem List   Diagnosis Date Noted  . Closed left hip fracture (HCC) 09/29/2020  . Essential hypertension 09/29/2020  . Closed left hip fracture, initial encounter (HCC) 09/29/2020  . Acute pain of right knee 03/20/2019  . Small bowel obstruction (HCC) 09/14/2015  . Bowel obstruction (HCC) 09/13/2015  . Hyperlipidemia 08/22/2015  . 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 Right-sided extracranial carotid artery stenosis 08/11/2015  . Type 2 diabetes mellitus (HCC) 08/11/2015  . Accelerated hypertension 08/11/2015  . Tobacco abuse 08/11/2015   PCP:  Georgianne Fick, MD Pharmacy:   [facility] [address] [place] - [address] [place] [address] Phone: [phone] Fax: [phone]  [facility] [address] [place], [place] - [address] [place] [address] Phone: [phone] Fax: [phone]     Social Determinants of Health (SDOH) Interventions    Readmission Risk Interventions No flowsheet data found.

## 2020-10-01 NOTE — Progress Notes (Signed)

## 2020-10-02 LAB — CBC
HCT: 29.1 % — ABNORMAL LOW (ref 36.0–46.0)
Hemoglobin: 8.7 g/dL — ABNORMAL LOW (ref 12.0–15.0)
MCH: 26.3 pg (ref 26.0–34.0)
MCHC: 29.9 g/dL — ABNORMAL LOW (ref 30.0–36.0)
MCV: 87.9 fL (ref 80.0–100.0)
Platelets: 242 10*3/uL (ref 150–400)
RBC: 3.31 MIL/uL — ABNORMAL LOW (ref 3.87–5.11)
RDW: 21.5 % — ABNORMAL HIGH (ref 11.5–15.5)
WBC: 7.7 10*3/uL (ref 4.0–10.5)
nRBC: 0 % (ref 0.0–0.2)

## 2020-10-02 LAB — BASIC METABOLIC PANEL
Anion gap: 12 (ref 5–15)
BUN: 10 mg/dL (ref 8–23)
CO2: 29 mmol/L (ref 22–32)
Calcium: 9.4 mg/dL (ref 8.9–10.3)
Chloride: 98 mmol/L (ref 98–111)
Creatinine, Ser: 0.88 mg/dL (ref 0.44–1.00)
GFR, Estimated: >60 mL/min (ref >60)
Glucose, Bld: 157 mg/dL — ABNORMAL HIGH (ref 70–99)
Potassium: 3.8 mmol/L (ref 3.5–5.1)
Sodium: 139 mmol/L (ref 135–145)

## 2020-10-02 LAB — GLUCOSE, CAPILLARY
Glucose-Capillary: 166 mg/dL — ABNORMAL HIGH (ref 70–99)
Glucose-Capillary: 165 mg/dL — ABNORMAL HIGH (ref 70–99)
Glucose-Capillary: 204 mg/dL — ABNORMAL HIGH (ref 70–99)
Glucose-Capillary: 183 mg/dL — ABNORMAL HIGH (ref 70–99)
Glucose-Capillary: 176 mg/dL — ABNORMAL HIGH (ref 70–99)

## 2020-10-02 NOTE — Progress Notes (Signed)
Patient ID: [name], female   DOB: September 18, 1945, 75 y.o.   MRN: [id number] Subjective: 2 Days Post-Op Procedure(s) (LRB): INTRAMEDULLARY (IM) NAIL (Left)    Patient reports pain as mild to moderate depending on activity but significant events noted  Objective:   VITALS:   Vitals:   10/01/20 1945 10/02/20 0438  BP: (!) 146/59 (!) 174/96  Pulse: 100 100  Resp: 16 16  Temp: 98.7 F (37.1 C) 98 F (36.7 C)  SpO2: 94% 95%    Neurovascular intact Incision: dressing C/D/I  LABS Recent Labs    09/30/20 1225 10/01/20 0156 10/02/20 0227  HGB 10.8* 9.7* 8.7*  HCT 35.8* 30.4* 29.1*  WBC 14.3* 10.5 7.7  PLT 310 277 242    Recent Labs    09/29/20 1850 09/30/20 1225 10/01/20 0156 10/02/20 0227  NA 139  --  138 139  K 3.9  --  3.7 3.8  BUN 22  --  11 10  CREATININE 0.98 0.87 0.84 0.88  GLUCOSE 204*  --  148* 157*    No results for input(s): LABPT, INR in the last 72 hours.   Assessment/Plan: 2 Days Post-Op Procedure(s) (LRB): INTRAMEDULLARY (IM) NAIL (Left)   Advance diet Up with therapy  Trying to figure out disposition and whether to go home or not RTC in 2 weeks with [employer]

## 2020-10-02 NOTE — Progress Notes (Signed)
PROGRESS NOTE    [name]  FWY:[id number] DOB: 1946-01-18 DOA: 09/29/2020 PCP: Joanne Fick, MD   Chief Complaint  Patient presents with  . Leg Injury  . Fall  . Hip Pain  Brief Narrative: 75 old female with hypertension diabetes mellitus COPD, TIA, Crohn's disease who had a fall when picking up groceries after she slipped on the mat and fell on the floor, without loss of consciousness.  In the ED had left hip fracture seen on the x-rays orthopedic was consulted labs reviewed stable EKG with sinus tachycardia and LVH Covid negative.  Patient was admitted for operative intervention Patient is status post IM fixation of left femur 1/28.  Working with PT OT-skilled nursing facility recommended.  Subjective: Worked with PT this morning,  she is on the bedside chair Overnight no fever, blood pressure on higher side, leukocytosis resolved.  Pain is moderate.    Assessment & Plan:  Closed left hip fracture initial encounter secondary to mechanical fall: s/p  IM fixation of left femur  1/28.  On Lovenox for DVT prophylaxis.  Continue PT OT.  Defer to orthopedic for outpatient DVT prophylaxis and pain management prescription. She likely needs SNF, she lives alone.  Acute blood loss anemia in the setting of anemia of chronic disease.  Monitor H&H.  transfuse if hb <7 gm on admission 12.5 g dropped to 8.7, cbc in am Recent Labs  Lab 09/29/20 1850 09/30/20 1225 10/01/20 0156 10/02/20 0227  HGB 12.4 10.8* 9.7* 8.7*  HCT 41.4 35.8* 30.4* 29.1*   Type 2 diabetes mellitus: Blood sugar well controlled on sliding scale insulin.   Recent Labs  Lab 10/01/20 1147 10/01/20 1601 10/01/20 2026 10/01/20 2357 10/02/20 0435  GLUCAP 253* 186* 180* 147* 166*   Hypertension poorly controlled likely due to postop and pain.  Continue amlodipine 5 mg, as needed hydralazine.   Hyperlipidemia continue statin  COPD not in exacerbation respiratory status is stable, continue home Anoro  Ellipta, prn albuterol.  Continue and encourage incentive spirometry-I educated her on use of incentive spirometry  Nutrition: Diet Order            Diet Carb Modified Fluid consistency: Thin; Room service appropriate? Yes  Diet effective now                 Nutrition Problem: Increased nutrient needs Etiology: post-op healing Signs/Symptoms: estimated needs Interventions: MVI,Glucerna shake  Body mass index is 25.4 kg/m.  DVT prophylaxis: enoxaparin (LOVENOX) injection 40 mg Start: 10/01/20 0800 SCDs Start: 09/30/20 1146 Code Status:   Code Status: Full Code  Family Communication: plan of care discussed with patient at bedside.  Status is: Inpatient Remains inpatient appropriate because:Hip fracture and need for surgical intervention  Dispo: The patient is from: Home  LIVES ALONE              Anticipated d/c is to: SNF, TOC consulted              Anticipated d/c date is: once bed available              Patient currently is medically stable for discharge   Difficult to place patient No  Consultants:see note  Procedures:see note  Culture/Microbiology    Component Value Date/Time   SDES  06/22/2018 1430    FLUID Performed at [facility], [address], [place], [place] [address]    [facility]  06/22/2018 1430    RIGHT KNEE Performed at [facility]  [address], [place], [place] [address]    CULT  06/22/2018 1154    NO GROWTH 5 DAYS Performed at [facility], [address] [place], [place] [address]    REPTSTATUS 06/22/2018 FINAL 06/22/2018 1430    Other culture-see note  Medications: Scheduled Meds: . amLODipine  5 mg Oral Daily  . aspirin EC  81 mg Oral Daily  . buPROPion  300 mg Oral Daily  . calcium carbonate  1,250 mg Oral QAC supper  . cholecalciferol  1,000 Units Oral Daily  . docusate sodium  100 mg Oral BID  . enoxaparin (LOVENOX) injection  40 mg Subcutaneous Q24H  . feeding supplement (GLUCERNA SHAKE)  237 mL  Oral TID BM  . insulin aspart  0-9 Units Subcutaneous Q4H  . levothyroxine  50 mcg Oral Q0600  . multivitamin with minerals  1 tablet Oral Daily  . omega-3 acid ethyl esters  1 g Oral Daily  . rosuvastatin  40 mg Oral Daily  . senna  1 tablet Oral BID  . umeclidinium-vilanterol  1 puff Inhalation Daily  . vitamin E  100 Units Oral Daily   Continuous Infusions: . sodium chloride Stopped (09/30/20 1939)  . methocarbamol (ROBAXIN) IV      Antimicrobials: Anti-infectives (From admission, onward)   Start     Dose/Rate Route Frequency Ordered Stop   09/30/20 1430  ceFAZolin (ANCEF) IVPB 2g/100 mL premix        2 g 200 mL/hr over 30 Minutes Intravenous Every 6 hours 09/30/20 1146 09/30/20 2009   09/30/20 0830  ceFAZolin (ANCEF) IVPB 2g/100 mL premix        2 g 200 mL/hr over 30 Minutes Intravenous On call to O.R. 09/30/20 0815 09/30/20 0848   09/30/20 0820  ceFAZolin (ANCEF) 2-4 GM/100ML-% IVPB       Note to Pharmacy: Tawanna Sat   : cabinet override      09/30/20 0820 09/30/20 0849     Objective: Vitals: Today's Vitals   10/02/20 0438 10/02/20 0506 10/02/20 0607 10/02/20 0740  BP: (!) 174/96   (!) 146/71  Pulse: 100   99  Resp: 16   16  Temp: 98 F (36.7 C)   98.6 F (37 C)  TempSrc: Oral   Oral  SpO2: 95%   94%  Weight:      Height:      PainSc:  8  3      Intake/Output Summary (Last 24 hours) at 10/02/2020 0802 Last data filed at 10/01/2020 1400 Gross per 24 hour  Intake 480 ml  Output 1 ml  Net 479 ml   Filed Weights   09/29/20 1802  Weight: 67.1 kg   Weight change:   Intake/Output from previous day: 01/29 0701 - 01/30 0700 In: 480 [P.O.:480] Out: 1 [Urine:1] Intake/Output this shift: No intake/output data recorded. Filed Weights   09/29/20 1802  Weight: 67.1 kg    Examination: General exam: AAOx3 , NAD, weak appearing. HEENT:Oral mucosa moist, Ear/Nose WNL grossly, dentition normal. Respiratory system: bilaterally clear,no wheezing or crackles,no  use of accessory muscle Cardiovascular system: S1 & S2 +, No JVD,. Gastrointestinal system: Abdomen soft, NT,ND, BS+ Nervous System:Alert, awake, moving extremities and grossly nonfocal Extremities: No edema, left hip surgical side with dressing in place clean dry, distal peripheral pulses palpable.  Skin: No rashes,no icterus. MSK: Normal muscle bulk,tone, power   Data Reviewed: I have personally reviewed following labs and imaging studies CBC: Recent Labs  Lab 09/29/20 1850 09/30/20 1225 10/01/20 0156 10/02/20 0227  WBC 9.2 14.3* 10.5 7.7  HGB 12.4 10.8* 9.7* 8.7*  HCT 41.4 35.8* 30.4* 29.1*  MCV 87.2 86.9 85.4 87.9  PLT 336 310 277 242   Basic Metabolic Panel: Recent Labs  Lab 09/29/20 1850 09/30/20 1225 10/01/20 0156 10/02/20 0227  NA 139  --  138 139  K 3.9  --  3.7 3.8  CL 100  --  102 98  CO2 26  --  25 29  GLUCOSE 204*  --  148* 157*  BUN 22  --  11 10  CREATININE 0.98 0.87 0.84 0.88  CALCIUM 9.7  --  9.1 9.4   GFR: Estimated Creatinine Clearance: 52.9 mL/min (by C-G formula based on SCr of 0.88 mg/dL). Liver Function Tests: No results for input(s): AST, ALT, ALKPHOS, BILITOT, PROT, ALBUMIN in the last 168 hours. No results for input(s): LIPASE, AMYLASE in the last 168 hours. No results for input(s): AMMONIA in the last 168 hours. Coagulation Profile: No results for input(s): INR, PROTIME in the last 168 hours. Cardiac Enzymes: No results for input(s): CKTOTAL, CKMB, CKMBINDEX, TROPONINI in the last 168 hours. BNP (last 3 results) No results for input(s): PROBNP in the last 8760 hours. HbA1C: No results for input(s): HGBA1C in the last 72 hours. CBG: Recent Labs  Lab 10/01/20 1147 10/01/20 1601 10/01/20 2026 10/01/20 2357 10/02/20 0435  GLUCAP 253* 186* 180* 147* 166*   Lipid Profile: No results for input(s): CHOL, HDL, LDLCALC, TRIG, CHOLHDL, LDLDIRECT in the last 72 hours. Thyroid Function Tests: No results for input(s): TSH, T4TOTAL,  FREET4, T3FREE, THYROIDAB in the last 72 hours. Anemia Panel: No results for input(s): VITAMINB12, FOLATE, FERRITIN, TIBC, IRON, RETICCTPCT in the last 72 hours. Sepsis Labs: No results for input(s): PROCALCITON, LATICACIDVEN in the last 168 hours.  Recent Results (from the past 240 hour(s))  SARS CORONAVIRUS 2 (TAT 6-24 HRS) Nasopharyngeal Nasopharyngeal Swab     Status: None   Collection Time: 09/29/20  9:00 PM   Specimen: Nasopharyngeal Swab  Result Value Ref Range Status   SARS Coronavirus 2 NEGATIVE NEGATIVE Final    Comment: (NOTE) SARS-CoV-2 target nucleic acids are NOT DETECTED.  The SARS-CoV-2 RNA is generally detectable in upper and lower respiratory specimens during the acute phase of infection. Negative results do not preclude SARS-CoV-2 infection, do not rule out co-infections with other pathogens, and should not be used as the sole basis for treatment or other patient management decisions. Negative results must be combined with clinical observations, patient history, and epidemiological information. The expected result is Negative.  Fact Sheet for Patients: HairSlick.no  Fact Sheet for Healthcare Providers: quierodirigir.com  This test is not yet approved or cleared by the [place] FDA and  has been authorized for detection and/or diagnosis of SARS-CoV-2 by FDA under an Emergency Use Authorization (EUA). This EUA will remain  in effect (meaning this test can be used) for the duration of the COVID-19 declaration under Se ction 564(b)(1) of the Act, 21 U.S.C. section 360bbb-3(b)(1), unless the authorization is terminated or revoked sooner.  Performed at [facility], [address] [place], [place] [address]   Surgical pcr screen     Status: None   Collection Time: 09/30/20  2:13 AM   Specimen: Nasal Mucosa; Nasal Swab  Result Value Ref Range Status   MRSA, PCR NEGATIVE NEGATIVE Final    Staphylococcus aureus NEGATIVE NEGATIVE Final    Comment: (NOTE) The Xpert  SA Assay (FDA approved for NASAL specimens in patients 322 years of age and older), is one component of a comprehensive surveillance program. It is not intended to diagnose infection nor to guide or monitor treatment. Performed at [facility], [address] [place], [place] [address]      Radiology Studies: Pelvis Portable  Result Date: 09/30/2020 CLINICAL DATA:  Left femur ORIF. EXAM: PORTABLE PELVIS 1-2 VIEWS COMPARISON:  Left hip x-rays from same day and yesterday. FINDINGS: Interval gamma nail fixation of the essentially nondisplaced left intertrochanteric femur fracture. Alignment is near anatomic. No evidence of hardware failure or loosening. Postsurgical changes in the surrounding soft tissues. IMPRESSION: 1. Interval left intertrochanteric femur fracture ORIF without acute postoperative complication. Electronically Signed   By: Obie DredgeWilliam T Derry M.D.   On: 09/30/2020 10:46   DG C-Arm 1-60 Min  Result Date: 09/30/2020 CLINICAL DATA:  Status post ORIF of proximal left femur fracture. EXAM: DG C-ARM 1-60 MIN; OPERATIVE LEFT HIP WITH PELVIS COMPARISON:  09/29/2020 FLUOROSCOPY TIME:  Fluoroscopy Time:  1 minutes 4 seconds Radiation Exposure Index (if provided by the fluoroscopic device): 10.43 mGy Number of Acquired Spot Images: 0 FINDINGS: Four images from portable C-arm radiography obtained in the operating room show open reduction and internal fixation of the intertrochanteric fracture involving the proximal left femur. IMPRESSION: 1. Status post ORIF of proximal left femur fracture. Electronically Signed   By: Signa Kellaylor  Stroud M.D.   On: 09/30/2020 10:08   DG HIP OPERATIVE UNILAT WITH PELVIS LEFT  Result Date: 09/30/2020 CLINICAL DATA:  Status post ORIF of proximal left femur fracture. EXAM: DG C-ARM 1-60 MIN; OPERATIVE LEFT HIP WITH PELVIS COMPARISON:  09/29/2020 FLUOROSCOPY TIME:  Fluoroscopy Time:  1  minutes 4 seconds Radiation Exposure Index (if provided by the fluoroscopic device): 10.43 mGy Number of Acquired Spot Images: 0 FINDINGS: Four images from portable C-arm radiography obtained in the operating room show open reduction and internal fixation of the intertrochanteric fracture involving the proximal left femur. IMPRESSION: 1. Status post ORIF of proximal left femur fracture. Electronically Signed   By: Signa Kellaylor  Stroud M.D.   On: 09/30/2020 10:08     LOS: 3 days   Lanae Boastamesh Randee Huston, MD [facility]  10/02/2020, 8:02 AM

## 2020-10-02 NOTE — Plan of Care (Signed)

## 2020-10-03 ENCOUNTER — Encounter (HOSPITAL_COMMUNITY): Payer: Self-pay | Admitting: Orthopedic Surgery

## 2020-10-03 LAB — BASIC METABOLIC PANEL
Anion gap: 12 (ref 5–15)
BUN: 14 mg/dL (ref 8–23)
CO2: 30 mmol/L (ref 22–32)
Calcium: 9.4 mg/dL (ref 8.9–10.3)
Chloride: 97 mmol/L — ABNORMAL LOW (ref 98–111)
Creatinine, Ser: 0.86 mg/dL (ref 0.44–1.00)
GFR, Estimated: >60 mL/min (ref >60)
Glucose, Bld: 166 mg/dL — ABNORMAL HIGH (ref 70–99)
Potassium: 3.3 mmol/L — ABNORMAL LOW (ref 3.5–5.1)
Sodium: 139 mmol/L (ref 135–145)

## 2020-10-03 LAB — GLUCOSE, CAPILLARY
Glucose-Capillary: 129 mg/dL — ABNORMAL HIGH (ref 70–99)
Glucose-Capillary: 136 mg/dL — ABNORMAL HIGH (ref 70–99)
Glucose-Capillary: 163 mg/dL — ABNORMAL HIGH (ref 70–99)
Glucose-Capillary: 174 mg/dL — ABNORMAL HIGH (ref 70–99)
Glucose-Capillary: 183 mg/dL — ABNORMAL HIGH (ref 70–99)
Glucose-Capillary: 189 mg/dL — ABNORMAL HIGH (ref 70–99)
Glucose-Capillary: 267 mg/dL — ABNORMAL HIGH (ref 70–99)

## 2020-10-03 LAB — CBC
HCT: 29.3 % — ABNORMAL LOW (ref 36.0–46.0)
Hemoglobin: 9.3 g/dL — ABNORMAL LOW (ref 12.0–15.0)
MCH: 27 pg (ref 26.0–34.0)
MCHC: 31.7 g/dL (ref 30.0–36.0)
MCV: 85.2 fL (ref 80.0–100.0)
Platelets: 278 10*3/uL (ref 150–400)
RBC: 3.44 MIL/uL — ABNORMAL LOW (ref 3.87–5.11)
RDW: 21.1 % — ABNORMAL HIGH (ref 11.5–15.5)
WBC: 7.4 10*3/uL (ref 4.0–10.5)
nRBC: 0 % (ref 0.0–0.2)

## 2020-10-03 LAB — SARS CORONAVIRUS 2 (TAT 6-24 HRS): SARS Coronavirus 2: NEGATIVE

## 2020-10-03 MED ORDER — POTASSIUM CHLORIDE CRYS ER 20 MEQ PO TBCR
40.0000 meq | EXTENDED_RELEASE_TABLET | Freq: Once | ORAL | Status: AC
  Administered 2020-10-03: 40 meq via ORAL
  Filled 2020-10-03: qty 2

## 2020-10-03 MED ORDER — ASPIRIN 81 MG PO TBEC: 81.0000 mg | DELAYED_RELEASE_TABLET | Freq: Two times a day (BID) | ORAL | 0 refills | Status: AC

## 2020-10-03 MED ORDER — SENNA 8.6 MG PO TABS: 1.0000 | ORAL_TABLET | Freq: Two times a day (BID) | ORAL | 0 refills | Status: AC

## 2020-10-03 MED ORDER — INSULIN ASPART 100 UNIT/ML ~~LOC~~ SOLN
0.0000 [IU] | Freq: Three times a day (TID) | SUBCUTANEOUS | Status: DC
  Administered 2020-10-03: 2 [IU] via SUBCUTANEOUS
  Administered 2020-10-03: 5 [IU] via SUBCUTANEOUS
  Administered 2020-10-04: 2 [IU] via SUBCUTANEOUS
  Administered 2020-10-04: 3 [IU] via SUBCUTANEOUS
  Administered 2020-10-04: 5 [IU] via SUBCUTANEOUS
  Administered 2020-10-05: 2 [IU] via SUBCUTANEOUS
  Administered 2020-10-05 (×2): 3 [IU] via SUBCUTANEOUS

## 2020-10-03 MED ORDER — HYDROCODONE-ACETAMINOPHEN 5-325 MG PO TABS: 1.0000 | ORAL_TABLET | ORAL | 0 refills | Status: AC | PRN

## 2020-10-03 NOTE — Progress Notes (Signed)
    Subjective:  Patient reports pain as mild to moderate.  Denies N/V/CP/SOB.   Objective:   VITALS:   Vitals:   10/02/20 1458 10/02/20 1945 10/03/20 0346 10/03/20 0756  BP: (!) 158/68 134/71 (!) 148/77 (!) 153/71  Pulse: (!) 103 (!) 108 97 95  Resp: 17 17 16 17   Temp: 99.1 F (37.3 C) 98.9 F (37.2 C) 98.4 F (36.9 C) 98.5 F (36.9 C)  TempSrc: Oral Oral Oral Oral  SpO2: 95% 94% 97% 95%  Weight:      Height:        NAD ABD soft Neurovascular intact Sensation intact distally Intact pulses distally Dorsiflexion/Plantar flexion intact Incision: dressing C/D/I   Lab Results  Component Value Date   WBC 7.4 10/03/2020   HGB 9.3 (L) 10/03/2020   HCT 29.3 (L) 10/03/2020   MCV 85.2 10/03/2020   PLT 278 10/03/2020   BMET    Component Value Date/Time   NA 139 10/03/2020 0424   K 3.3 (L) 10/03/2020 0424   CL 97 (L) 10/03/2020 0424   CO2 30 10/03/2020 0424   GLUCOSE 166 (H) 10/03/2020 0424   BUN 14 10/03/2020 0424   CREATININE 0.86 10/03/2020 0424   CREATININE 0.90 11/03/2015 1045   CALCIUM 9.4 10/03/2020 0424   GFRNONAA >60 10/03/2020 0424   GFRAA 60 (L) 06/23/2018 1551     Assessment/Plan: 3 Days Post-Op   Principal Problem:   Closed left hip fracture (HCC) Active Problems:   Type 2 diabetes mellitus (HCC)   Essential hypertension   Closed left hip fracture, initial encounter (HCC)   WBAT with walker DVT ppx: Aspirin and Lovenox, SCDs, TEDS   ASA 81mg  BID at D/C ABLA: HgB 9.3, treat per hospitalist recommendations PO pain control PT/OT Dispo: D/C planning    06/25/2018 10/03/2020, 8:14 AM   [facility] is now 10/05/2020 [address], [place], [employer] [place] Phone: [phone] [email or web address] Facebook  [phone]

## 2020-10-03 NOTE — Progress Notes (Signed)
Occupational Therapy Treatment Patient Details Name: [name] MRN: [id number] DOB: [birth year] Today's Date: 10/03/2020    History of present illness 75 y.o. female with a past medical history of hypertension, diabetes, COPD, TIA, and Crohn's disease who had a mechanical fall at home. Patient sustained L intertrochanteric femur fx. Patient s/p IM nail L femur on 1/28.   OT comments  Pt making progress with functional goals. Pt unable to get a bed at preferred SNF, so now plans to d/c home with [facility] therapies. OT will continue to follow acutely to maximize level of function and safety  Follow Up Recommendations  Home health OT;Supervision/Assistance - 24 hour    Equipment Recommendations  3 in 1 bedside commode;Other (comment) (RW, reacher)    Recommendations for Other Services      Precautions / Restrictions Precautions Precautions: Fall Restrictions Weight Bearing Restrictions: Yes LLE Weight Bearing: Weight bearing as tolerated       Mobility Bed Mobility Overal bed mobility: Needs Assistance Bed Mobility: Supine to Sit     Supine to sit: Min assist     General bed mobility comments: min A with L LE to EOB/off EOB  Transfers Overall transfer level: Needs assistance Equipment used: Rolling walker (2 wheeled) Transfers: Sit to/from Stand Sit to Stand: Min assist;Min guard         General transfer comment: VC for hand placement and positioning of LE to stand, no physical assist given.    Balance Overall balance assessment: Needs assistance Sitting-balance support: Feet supported Sitting balance-Leahy Scale: Fair Sitting balance - Comments: able to sustain without UE support, prefers UE support to offweight pain   Standing balance support: Bilateral upper extremity supported;During functional activity Standing balance-Leahy Scale: Poor Standing balance comment: reliant on UE support                           ADL either performed or assessed  with clinical judgement   ADL Overall ADL's : Needs assistance/impaired                 Upper Body Dressing : Set up;Sitting;Cueing for safety;Cueing for sequencing;Supervision/safety   Lower Body Dressing: Maximal assistance;Cueing for sequencing;Cueing for compensatory techniques;Sit to/from stand   Toilet Transfer: Minimal assistance;Min guard;Stand-pivot;BSC;RW;Cueing for safety   Toileting- Clothing Manipulation and Hygiene: Min guard;Cueing for safety;Cueing for sequencing;Sit to/from stand       Functional mobility during ADLs: Min guard;Cueing for safety;Cueing for sequencing;Rolling walker       Vision Patient Visual Report: No change from baseline     Perception     Praxis      Cognition Arousal/Alertness: Awake/alert Behavior During Therapy: WFL for tasks assessed/performed Overall Cognitive Status: Within Functional Limits for tasks assessed                                 General Comments: agreeable to all corrections and education, able to describe measures taken at home to improve safety        Exercises Exercises: General Lower Extremity General Exercises - Lower Extremity Ankle Circles/Pumps: AROM;Both;10 reps Long Arc Quad: AROM;Left;5 reps;Seated Toe Raises: AROM;Both;10 reps;Seated Heel Raises: AROM;Both;10 reps;Seated   Shoulder Instructions       General Comments VSS    Pertinent Vitals/ Pain       Pain Assessment: 0-10 Pain Score: 6  Pain Location: L hip Pain Descriptors / Indicators:  Guarding;Grimacing Pain Intervention(s): Limited activity within patient's tolerance;Premedicated before session;Monitored during session;Repositioned  Home Living                                          Prior Functioning/Environment              Frequency  Min 2X/week        Progress Toward Goals  OT Goals(current goals can now be found in the care plan section)  Progress towards OT goals:  Progressing toward goals  Acute Rehab OT Goals Patient Stated Goal: to go home  Plan Discharge plan needs to be updated;Frequency remains appropriate    Co-evaluation                 AM-PAC OT "6 Clicks" Daily Activity     Outcome Measure   Help from another person eating meals?: None Help from another person taking care of personal grooming?: A Little Help from another person toileting, which includes using toliet, bedpan, or urinal?: A Little Help from another person bathing (including washing, rinsing, drying)?: A Lot Help from another person to put on and taking off regular upper body clothing?: A Little Help from another person to put on and taking off regular lower body clothing?: A Little 6 Click Score: 18    End of Session Equipment Utilized During Treatment: Gait belt;Rolling walker;Other (comment) (BSC)  OT Visit Diagnosis: Unsteadiness on feet (R26.81);History of falling (Z91.81);Pain Pain - Right/Left: Left Pain - part of body: Hip   Activity Tolerance Patient limited by pain   Patient Left in chair;with chair alarm set;with call bell/phone within reach   Nurse Communication          Time: 0630-1601 OT Time Calculation (min): 25 min  Charges: OT General Charges $OT Visit: 1 Visit OT Treatments $Self Care/Home Management : 8-22 mins $Therapeutic Activity: 8-22 mins     Galen Manila 10/03/2020, 2:17 PM

## 2020-10-03 NOTE — Plan of Care (Signed)
  Problem: Pain Managment: Goal: General experience of comfort will improve Outcome: Progressing   Problem: Safety: Goal: Ability to remain free from injury will improve Outcome: Progressing   Problem: Skin Integrity: Goal: Risk for impaired skin integrity will decrease Outcome: Progressing   

## 2020-10-03 NOTE — Progress Notes (Signed)
Physical Therapy Treatment Patient Details Name: [name] MRN: [id number] DOB: 05-13-1946 Today's Date: 10/03/2020    History of Present Illness 75 y.o. female with a past medical history of hypertension, diabetes, COPD, TIA, and Crohn's disease who had a mechanical fall at home. Patient sustained L intertrochanteric femur fx. Patient s/p IM nail L femur on 1/28.    PT Comments    Pt received in recliner, able to demo good progress with functional transfers and ambulation tolerance during today's session. The pt was able to complete transfers with VC for hand placement and positioning but no physical assist, as well as progress to short bout of ambulation in the room with RW. Pt voiced new goal of d/c home with [facility] therapies, and will require continued acute PT to maximize functional mobility, safety with stair navigation, and activity tolerance prior to d/c home.     Follow Up Recommendations  Home health PT;Supervision/Assistance - 24 hour     Equipment Recommendations  Rolling walker with 5" wheels;3in1 (PT)    Recommendations for Other Services       Precautions / Restrictions Precautions Precautions: Fall Restrictions Weight Bearing Restrictions: Yes LLE Weight Bearing: Weight bearing as tolerated    Mobility  Bed Mobility Overal bed mobility: Needs Assistance             General bed mobility comments: pt OOB in recliner at start and end of session  Transfers Overall transfer level: Needs assistance Equipment used: Rolling walker (2 wheeled) Transfers: Sit to/from Stand Sit to Stand: Min guard         General transfer comment: VC for hand placement and positioning of LE to stand, no physical assist given.  Ambulation/Gait Ambulation/Gait assistance: Min guard Gait Distance (Feet): 10 Feet (+ 6 ft) Assistive device: Rolling walker (2 wheeled) Gait Pattern/deviations: Step-to pattern;Decreased stride length;Decreased stance time - left;Antalgic;Wide  base of support Gait velocity: decreased   General Gait Details: distance limited by pain, VC for positioning in RW to improve use of UE, pt verbalized agreement but benefits from repeated cues due to internal distraction from pain   Stairs - pt unable to tolerate today, will need to manage 2 x 1 step to enter her home.                 Balance Overall balance assessment: Needs assistance Sitting-balance support: Feet supported Sitting balance-Leahy Scale: Fair Sitting balance - Comments: able to sustain without UE support, prefers UE support to offweight pain   Standing balance support: Bilateral upper extremity supported;During functional activity Standing balance-Leahy Scale: Poor Standing balance comment: reliant on UE support                            Cognition Arousal/Alertness: Awake/alert Behavior During Therapy: WFL for tasks assessed/performed Overall Cognitive Status: Within Functional Limits for tasks assessed                                 General Comments: agreeable to all corrections and education, able to describe measures taken at home to improve safety      Exercises General Exercises - Lower Extremity Ankle Circles/Pumps: AROM;Both;10 reps Long Arc Quad: AROM;Left;5 reps;Seated Toe Raises: AROM;Both;10 reps;Seated Heel Raises: AROM;Both;10 reps;Seated    General Comments General comments (skin integrity, edema, etc.): VSS      Pertinent Vitals/Pain Pain Assessment: 0-10 Pain Score: 6  Pain Location: L hip Pain Descriptors / Indicators: Guarding;Grimacing Pain Intervention(s): Limited activity within patient's tolerance;Premedicated before session;Monitored during session;Repositioned;Ice applied           PT Goals (current goals can now be found in the care plan section) Acute Rehab PT Goals Patient Stated Goal: to go home PT Goal Formulation: With patient Time For Goal Achievement: 10/15/20 Potential to  Achieve Goals: Good Progress towards PT goals: Progressing toward goals    Frequency    Min 5X/week      PT Plan Discharge plan needs to be updated       AM-PAC PT "6 Clicks" Mobility   Outcome Measure  Help needed turning from your back to your side while in a flat bed without using bedrails?: A Little Help needed moving from lying on your back to sitting on the side of a flat bed without using bedrails?: A Little Help needed moving to and from a bed to a chair (including a wheelchair)?: A Little Help needed standing up from a chair using your arms (e.g., wheelchair or bedside chair)?: A Little Help needed to walk in hospital room?: A Little Help needed climbing 3-5 steps with a railing? : A Lot 6 Click Score: 17    End of Session Equipment Utilized During Treatment: Gait belt Activity Tolerance: Patient limited by pain Patient left: in chair;with call bell/phone within reach;with chair alarm set Nurse Communication: Mobility status PT Visit Diagnosis: Unsteadiness on feet (R26.81);Muscle weakness (generalized) (M62.81);History of falling (Z91.81);Other abnormalities of gait and mobility (R26.89)     Time: 0630-1601 PT Time Calculation (min) (ACUTE ONLY): 24 min  Charges:  $Gait Training: 8-22 mins $Therapeutic Activity: 8-22 mins                     Rolm Baptise, PT, DPT   Acute Rehabilitation Department Pager #: [phone]   [name] 10/03/2020, 1:41 PM

## 2020-10-03 NOTE — Plan of Care (Signed)

## 2020-10-03 NOTE — Discharge Summary (Signed)
Physician Discharge Summary  EVALEEN SANT PPJ:[id number] DOB: 1946-08-29 DOA: 09/29/2020  PCP: Georgianne Fick, MD  Admit date: 09/29/2020 Discharge date: 10/05/2020  Admitted From: HOME Disposition: HH  Recommendations for Outpatient Follow-up:  1. Follow up with PCP in 1-2 weeks 2. Follow-up with orthopedic as OP. 3. Please follow up on the following pending results:  Home Health:yes  Equipment/Devices: Rolling walker   Discharge Condition: Stable Code Status:   Code Status: Full Code Diet recommendation:  Diet Order            Diet Carb Modified           Diet Carb Modified Fluid consistency: Thin; Room service appropriate? Yes  Diet effective now                 Brief/Interim Summary: 75 old female with hypertension diabetes mellitus COPD, TIA, Crohn's disease who had a fall when picking up groceries after she slipped on the mat and fell on the floor, without loss of consciousness.  In the ED had left hip fracture seen on the x-rays orthopedic was consulted labs reviewed stable EKG with sinus tachycardia and LVH Covid negative.  Patient was admitted for operative intervention Patient is status post IM fixation of left femur 1/28.  Working with PT OT-skilled nursing facility recommended. Patient reports improvement in terms of mobility and she did not go to skilled nursing facility and wants to go home, has family nearby to take care of her.  We will be setting up home health and DME Is medically stable for discharge home. She worked with physical therapy and they have recommended home health PT.  Patient feels okay after my discussion.  Also spoke with her daughter and informed PT recs and home health being set up and she is medically stable for discharge. Patient excited to go home. Daughter in agreement, for discharge home and she requested case manager to set up on the home and DME needs.I have discussed with Dr. Linna Caprice who is okay with discharge as per PT  recommendations.  Discharge Diagnoses:   Closed left hip fracture initial encounter secondary to mechanical fall: s/p  IM fixation of left femur  1/28.  Continue pain control and aspirin 80 mg twice daily as per orthopedics.  Continue home health PT.  Follow-up with orthopedics as scheduled.  Acute blood loss anemia in the setting of anemia of chronic disease.  Hemoglobin is stable.  Advise CBC check in 1 week.  Type 2 diabetes mellitus sugar borderline controlled, she is on insulin at home and she will resume upon discharge.  Hypokalemia repleted  Hypertension poorly controlled likely due to postop and pain.   Stable, continue home amlodipine.  Hyperlipidemia on statin  COPD not in exacerbation  she is not wheezing she is on room air.  She will continue her homeAnoro Ellipta, prn albuterol.  Encourage IS again  Consults:  Orhto  Subjective: Alert awake oriented, not in acute distress.  Resting comfortably. She does not have pain at rest has pain mostly with PT session.  She will complete physical therapy seen this morning. Patient requested to stay in the hospital 1 more day yesterday.  Discharge Exam: Vitals:   10/05/20 0806 10/05/20 0816  BP: 139/71   Pulse: 99 94  Resp: 17 16  Temp: 97.8 F (36.6 C)   SpO2: 96% 93%   General exam: AAOX3 , NAD, weak appearing. HEENT:Oral mucosa moist, Ear/Nose WNL grossly, dentition normal. Respiratory system: bilaterally clear,no wheezing or  crackles,no use of accessory muscle Cardiovascular system: S1 & S2 +, No JVD,. Gastrointestinal system: Abdomen soft, NT,ND, BS+ Nervous System:Alert, awake, left hip surgical site with dressing in place.  Moving extremities and grossly nonfocal Extremities: No edema, distal peripheral pulses palpable.  Skin: No rashes,no icterus. MSK: Normal muscle bulk,tone, power  Discharge Instructions  Discharge Instructions    Diet Carb Modified   Complete by: As directed    Discharge instructions    Complete by: As directed    Follow-up with orthopedic office in 2 weeks postop.  Check CBC in 1 week at the facility. Check blood sugar 4 times a day and see if you need to go back on her home insulin regimen.  Please call call MD or return to ER for similar or worsening recurring problem that brought you to hospital or if any fever,nausea/vomiting,abdominal pain, uncontrolled pain, chest pain,  shortness of breath or any other alarming symptoms.  Please follow-up your doctor as instructed in a week time and call the office for appointment.  Please avoid alcohol, smoking, or any other illicit substance and maintain healthy habits including taking your regular medications as prescribed.  You were cared for by a hospitalist during your hospital stay. If you have any questions about your discharge medications or the care you received while you were in the hospital after you are discharged, you can call the unit and ask to speak with the hospitalist on call if the hospitalist that took care of you is not available.  Once you are discharged, your primary care physician will handle any further medical issues. Please note that NO REFILLS for any discharge medications will be authorized once you are discharged, as it is imperative that you return to your primary care physician (or establish a relationship with a primary care physician if you do not have one) for your aftercare needs so that they can reassess your need for medications and monitor your lab values   Discharge wound care:   Complete by: As directed    Reinforce dressing  Until discontinued   Increase activity slowly   Complete by: As directed      Allergies as of 10/05/2020      Reactions   Simvastatin Other (See Comments)   Muscle cramps      Medication List    STOP taking these medications   aspirin 81 MG tablet Replaced by: aspirin 81 MG EC tablet   oxyCODONE-acetaminophen 5-325 MG tablet Commonly known as: Percocet      TAKE these medications   amLODipine 5 MG tablet Commonly known as: NORVASC Take 5 mg by mouth daily.   Anoro Ellipta 62.5-25 MCG/INH Aepb Generic drug: umeclidinium-vilanterol Inhale 1 puff into the lungs daily as needed (sob/wheezing).   aspirin 81 MG EC tablet Take 1 tablet (81 mg total) by mouth 2 (two) times daily with a meal. Swallow whole. Replaces: aspirin 81 MG tablet   Biotin 5000 MCG Tabs Take 5,000 mcg by mouth daily.   buPROPion 300 MG 24 hr tablet Commonly known as: WELLBUTRIN XL Take 300 mg by mouth daily.   CALCIUM PO Take 1 tablet by mouth daily.   CINNAMON PO Take 500 mg by mouth daily.   fish oil-omega-3 fatty acids 1000 MG capsule Take 1 g by mouth daily.   furosemide 20 MG tablet Commonly known as: LASIX Take 20 mg by mouth daily as needed for fluid.   HYDROcodone-acetaminophen 5-325 MG tablet Commonly known as: NORCO/VICODIN Take 1-2 tablets  by mouth every 4 (four) hours as needed for up to 7 days for moderate pain (pain score 4-6). What changed:   how much to take  reasons to take this  Another medication with the same name was removed. Continue taking this medication, and follow the directions you see here.   insulin regular 100 units/mL injection Commonly known as: NOVOLIN R Inject 5-11 Units into the skin 3 (three) times daily as needed for high blood sugar. Sliding Scale: If Blood Sugar is <150 Take 5 units, If Blood Sugar Between 150-200-Take 11 units   levothyroxine 50 MCG tablet Commonly known as: SYNTHROID Take 50 mcg by mouth daily.   NovoLIN N 100 UNIT/ML injection Generic drug: insulin NPH Human Inject 18-28 Units into the skin in the morning, at noon, and at bedtime. Take 28 units in BID & Take 18 units at bedtime   rosuvastatin 40 MG tablet Commonly known as: CRESTOR Take 40 mg by mouth daily.   senna 8.6 MG Tabs tablet Commonly known as: SENOKOT Take 1 tablet (8.6 mg total) by mouth 2 (two) times daily.   Ventolin  HFA 108 (90 Base) MCG/ACT inhaler Generic drug: albuterol Inhale 2 puffs into the lungs every 6 (six) hours as needed for wheezing or shortness of breath.   VITAMIN D PO Take 800 Units by mouth daily.   vitamin E 45 MG (100 UNITS) capsule Take 100 Units by mouth daily.            Discharge Care Instructions  (From admission, onward)         Start     Ordered   10/05/20 0000  Discharge wound care:       Comments: Reinforce dressing  Until discontinued   10/05/20 1122          Follow-up Information    Swinteck, Arlys John, MD. Schedule an appointment as soon as possible for a visit in 2 weeks.   Specialty: Orthopedic Surgery Why: For suture removal, For wound re-check Contact information: [address] [place] [address] [phone]        Georgianne Fick, MD Follow up in 1 week(s).   Specialty: Internal Medicine Contact information: [address] [place] [address] [place] [address] [phone]        [facility], [facility] Follow up.   Specialty: Home Health Services Why: Home health services will be provided by [facility] Contact information: [address] [place] [address] [phone]              Allergies  Allergen Reactions  . Simvastatin Other (See Comments)    Muscle cramps    The results of significant diagnostics from this hospitalization (including imaging, microbiology, ancillary and laboratory) are listed below for reference.    Microbiology: Recent Results (from the past 240 hour(s))  SARS CORONAVIRUS 2 (TAT 6-24 HRS) Nasopharyngeal Nasopharyngeal Swab     Status: None   Collection Time: 09/29/20  9:00 PM   Specimen: Nasopharyngeal Swab  Result Value Ref Range Status   SARS Coronavirus 2 NEGATIVE NEGATIVE Final    Comment: (NOTE) SARS-CoV-2 target nucleic acids are NOT DETECTED.  The SARS-CoV-2 RNA is generally detectable in upper and lower respiratory specimens during the  acute phase of infection. Negative results do not preclude SARS-CoV-2 infection, do not rule out co-infections with other pathogens, and should not be used as the sole basis for treatment or other patient management decisions. Negative results must be combined with  clinical observations, patient history, and epidemiological information. The expected result is Negative.  Fact Sheet for Patients: HairSlick.no  Fact Sheet for Healthcare Providers: quierodirigir.com  This test is not yet approved or cleared by the [place] FDA and  has been authorized for detection and/or diagnosis of SARS-CoV-2 by FDA under an Emergency Use Authorization (EUA). This EUA will remain  in effect (meaning this test can be used) for the duration of the COVID-19 declaration under Se ction 564(b)(1) of the Act, 21 U.S.C. section 360bbb-3(b)(1), unless the authorization is terminated or revoked sooner.  Performed at [facility], [address], [place], [place] [address]   Surgical pcr screen     Status: None   Collection Time: 09/30/20  2:13 AM   Specimen: Nasal Mucosa; Nasal Swab  Result Value Ref Range Status   MRSA, PCR NEGATIVE NEGATIVE Final   Staphylococcus aureus NEGATIVE NEGATIVE Final    Comment: (NOTE) The Xpert SA Assay (FDA approved for NASAL specimens in patients 71 years of age and older), is one component of a comprehensive surveillance program. It is not intended to diagnose infection nor to guide or monitor treatment. Performed at [facility], [address] [place], [place] [address]   SARS CORONAVIRUS 2 (TAT 6-24 HRS) Nasopharyngeal Nasopharyngeal Swab     Status: None   Collection Time: 10/03/20  5:13 PM   Specimen: Nasopharyngeal Swab  Result Value Ref Range Status   SARS Coronavirus 2 NEGATIVE NEGATIVE Final    Comment: (NOTE) SARS-CoV-2 target nucleic acids are NOT DETECTED.  The SARS-CoV-2 RNA is  generally detectable in upper and lower respiratory specimens during the acute phase of infection. Negative results do not preclude SARS-CoV-2 infection, do not rule out co-infections with other pathogens, and should not be used as the sole basis for treatment or other patient management decisions. Negative results must be combined with clinical observations, patient history, and epidemiological information. The expected result is Negative.  Fact Sheet for Patients: HairSlick.no  Fact Sheet for Healthcare Providers: quierodirigir.com  This test is not yet approved or cleared by the [place] FDA and  has been authorized for detection and/or diagnosis of SARS-CoV-2 by FDA under an Emergency Use Authorization (EUA). This EUA will remain  in effect (meaning this test can be used) for the duration of the COVID-19 declaration under Se ction 564(b)(1) of the Act, 21 U.S.C. section 360bbb-3(b)(1), unless the authorization is terminated or revoked sooner.  Performed at [facility], [address], [place], [place] [address]     Procedures/Studies: DG Chest 1 View  Result Date: 09/29/2020 CLINICAL DATA:  Patient status post fall. EXAM: CHEST  1 VIEW COMPARISON:  Chest radiograph 06/21/2015. FINDINGS: Monitoring leads overlie the patient. Stable cardiac and mediastinal contours. No large area pulmonary consolidation. No pleural effusion or pneumothorax. IMPRESSION: No acute cardiopulmonary process. Electronically Signed   By: Annia Belt M.D.   On: 09/29/2020 18:45   Pelvis Portable  Result Date: 09/30/2020 CLINICAL DATA:  Left femur ORIF. EXAM: PORTABLE PELVIS 1-2 VIEWS COMPARISON:  Left hip x-rays from same day and yesterday. FINDINGS: Interval gamma nail fixation of the essentially nondisplaced left intertrochanteric femur fracture. Alignment is near anatomic. No evidence of hardware failure or loosening. Postsurgical changes  in the surrounding soft tissues. IMPRESSION: 1. Interval left intertrochanteric femur fracture ORIF without acute postoperative complication. Electronically Signed   By: Obie Dredge M.D.   On: 09/30/2020 10:46   DG C-Arm 1-60 Min  Result Date: 09/30/2020 CLINICAL DATA:  Status post ORIF of proximal left femur fracture. EXAM: DG C-ARM 1-60 MIN; OPERATIVE LEFT HIP WITH PELVIS COMPARISON:  09/29/2020 FLUOROSCOPY TIME:  Fluoroscopy Time:  1 minutes 4 seconds Radiation Exposure Index (if provided by the fluoroscopic device): 10.43 mGy Number of Acquired Spot Images: 0 FINDINGS: Four images from portable C-arm radiography obtained in the operating room show open reduction and internal fixation of the intertrochanteric fracture involving the proximal left femur. IMPRESSION: 1. Status post ORIF of proximal left femur fracture. Electronically Signed   By: Signa Kell M.D.   On: 09/30/2020 10:08   DG HIP OPERATIVE UNILAT WITH PELVIS LEFT  Result Date: 09/30/2020 CLINICAL DATA:  Status post ORIF of proximal left femur fracture. EXAM: DG C-ARM 1-60 MIN; OPERATIVE LEFT HIP WITH PELVIS COMPARISON:  09/29/2020 FLUOROSCOPY TIME:  Fluoroscopy Time:  1 minutes 4 seconds Radiation Exposure Index (if provided by the fluoroscopic device): 10.43 mGy Number of Acquired Spot Images: 0 FINDINGS: Four images from portable C-arm radiography obtained in the operating room show open reduction and internal fixation of the intertrochanteric fracture involving the proximal left femur. IMPRESSION: 1. Status post ORIF of proximal left femur fracture. Electronically Signed   By: Signa Kell M.D.   On: 09/30/2020 10:08   DG HIP UNILAT W OR W/O PELVIS 2-3 VIEWS LEFT  Result Date: 09/29/2020 CLINICAL DATA:  Larey Seat, left leg pain EXAM: LEFT FEMUR 2 VIEWS; DG HIP (WITH OR WITHOUT PELVIS) 2-3V LEFT COMPARISON:  None. FINDINGS: Left hip: Frontal view of the pelvis as well as frontal and cross-table lateral views of the left hip are  obtained. There is a minimally displaced intertrochanteric left hip fracture without significant angulation. The hips remain well aligned. Prominent degenerative changes lower lumbar spine. Left femur: Frontal and lateral views demonstrate intertrochanteric left hip fracture which is minimally displaced. No other acute bony abnormalities. 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 IMPRESSION: 1. Intertrochanteric left hip fracture. Fracture is minimally displaced without significant angulation. Electronically Signed   By: Sharlet Salina M.D.   On: 09/29/2020 18:47   DG Femur Min 2 Views Left  Result Date: 09/29/2020 CLINICAL DATA:  Larey Seat, left leg pain EXAM: LEFT FEMUR 2 VIEWS; DG HIP (WITH OR WITHOUT PELVIS) 2-3V LEFT COMPARISON:  None. FINDINGS: Left hip: Frontal view of the pelvis as well as frontal and cross-table lateral views of the left hip are obtained. There is a minimally displaced intertrochanteric left hip fracture without significant angulation. The hips remain well aligned. Prominent degenerative changes lower lumbar spine. Left femur: Frontal and lateral views demonstrate intertrochanteric left hip fracture which is minimally displaced. No other acute bony abnormalities. 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 IMPRESSION: 1. Intertrochanteric left hip fracture. Fracture is minimally displaced without significant angulation. Electronically Signed   By: Sharlet Salina M.D.   On: 09/29/2020 18:47    Labs: BNP (last 3 results) No results for input(s): BNP in the last 8760 hours. Basic Metabolic Panel: Recent Labs  Lab 09/29/20 1850 09/30/20 1225 10/01/20 0156 10/02/20 0227 10/03/20 0424  NA 139  --  138 139 139  K 3.9  --  3.7 3.8 3.3*  CL 100  --  102 98 97*  CO2 26  --  25 29 30   GLUCOSE 204*  --  148* 157* 166*  BUN 22  --  11 10 14   CREATININE  0.98 0.87  0.84 0.88 0.86  CALCIUM 9.7  --  9.1 9.4 9.4   Liver Function Tests: No results for input(s): AST, ALT, ALKPHOS, BILITOT, PROT, ALBUMIN in the last 168 hours. No results for input(s): LIPASE, AMYLASE in the last 168 hours. No results for input(s): AMMONIA in the last 168 hours. CBC: Recent Labs  Lab 09/29/20 1850 09/30/20 1225 10/01/20 0156 10/02/20 0227 10/03/20 0424  WBC 9.2 14.3* 10.5 7.7 7.4  HGB 12.4 10.8* 9.7* 8.7* 9.3*  HCT 41.4 35.8* 30.4* 29.1* 29.3*  MCV 87.2 86.9 85.4 87.9 85.2  PLT 336 310 277 242 278   Cardiac Enzymes: No results for input(s): CKTOTAL, CKMB, CKMBINDEX, TROPONINI in the last 168 hours. BNP: Invalid input(s): POCBNP CBG: Recent Labs  Lab 10/04/20 1203 10/04/20 1556 10/04/20 1944 10/05/20 0614 10/05/20 1105  GLUCAP 275* 174* 166* 230* 220*   D-Dimer No results for input(s): DDIMER in the last 72 hours. Hgb A1c No results for input(s): HGBA1C in the last 72 hours. Lipid Profile No results for input(s): CHOL, HDL, LDLCALC, TRIG, CHOLHDL, LDLDIRECT in the last 72 hours. Thyroid function studies No results for input(s): TSH, T4TOTAL, T3FREE, THYROIDAB in the last 72 hours.  Invalid input(s): FREET3 Anemia work up No results for input(s): VITAMINB12, FOLATE, FERRITIN, TIBC, IRON, RETICCTPCT in the last 72 hours. Urinalysis    Component Value Date/Time   COLORURINE YELLOW 09/14/2015 0254   APPEARANCEUR CLEAR 09/14/2015 0254   LABSPEC 1.012 09/14/2015 0254   PHURINE 5.5 09/14/2015 0254   GLUCOSEU 100 (A) 09/14/2015 0254   HGBUR NEGATIVE 09/14/2015 0254   BILIRUBINUR NEGATIVE 09/14/2015 0254   KETONESUR NEGATIVE 09/14/2015 0254   PROTEINUR NEGATIVE 09/14/2015 0254   UROBILINOGEN 0.2 11/15/2009 0805   NITRITE NEGATIVE 09/14/2015 0254   LEUKOCYTESUR NEGATIVE 09/14/2015 0254   Sepsis Labs Invalid input(s): PROCALCITONIN,  WBC,  LACTICIDVEN Microbiology Recent Results (from the past 240 hour(s))  SARS CORONAVIRUS 2 (TAT 6-24 HRS)  Nasopharyngeal Nasopharyngeal Swab     Status: None   Collection Time: 09/29/20  9:00 PM   Specimen: Nasopharyngeal Swab  Result Value Ref Range Status   SARS Coronavirus 2 NEGATIVE NEGATIVE Final    Comment: (NOTE) SARS-CoV-2 target nucleic acids are NOT DETECTED.  The SARS-CoV-2 RNA is generally detectable in upper and lower respiratory specimens during the acute phase of infection. Negative results do not preclude SARS-CoV-2 infection, do not rule out co-infections with other pathogens, and should not be used as the sole basis for treatment or other patient management decisions. Negative results must be combined with clinical observations, patient history, and epidemiological information. The expected result is Negative.  Fact Sheet for Patients: HairSlick.nohttps://www.fda.gov/media/138098/download  Fact Sheet for Healthcare Providers: quierodirigir.comhttps://www.fda.gov/media/138095/download  This test is not yet approved or cleared by the [place] FDA and  has been authorized for detection and/or diagnosis of SARS-CoV-2 by FDA under an Emergency Use Authorization (EUA). This EUA will remain  in effect (meaning this test can be used) for the duration of the COVID-19 declaration under Se ction 564(b)(1) of the Act, 21 U.S.C. section 360bbb-3(b)(1), unless the authorization is terminated or revoked sooner.  Performed at [facility], [address] [place], [place] [address]   Surgical pcr screen     Status: None   Collection Time: 09/30/20  2:13 AM   Specimen: Nasal Mucosa; Nasal Swab  Result Value Ref Range Status   MRSA, PCR NEGATIVE NEGATIVE Final   Staphylococcus aureus NEGATIVE NEGATIVE Final  Comment: (NOTE) The Xpert SA Assay (FDA approved for NASAL specimens in patients 43 years of age and older), is one component of a comprehensive surveillance program. It is not intended to diagnose infection nor to guide or monitor treatment. Performed at [facility], [address], [place], [place] [address]   SARS CORONAVIRUS 2 (TAT 6-24 HRS) Nasopharyngeal Nasopharyngeal Swab     Status: None   Collection Time: 10/03/20  5:13 PM   Specimen: Nasopharyngeal Swab  Result Value Ref Range Status   SARS Coronavirus 2 NEGATIVE NEGATIVE Final    Comment: (NOTE) SARS-CoV-2 target nucleic acids are NOT DETECTED.  The SARS-CoV-2 RNA is generally detectable in upper and lower respiratory specimens during the acute phase of infection. Negative results do not preclude SARS-CoV-2 infection, do not rule out co-infections with other pathogens, and should not be used as the sole basis for treatment or other patient management decisions. Negative results must be combined with clinical observations, patient history, and epidemiological information. The expected result is Negative.  Fact Sheet for Patients: HairSlick.no  Fact Sheet for Healthcare Providers: quierodirigir.com  This test is not yet approved or cleared by the [place] FDA and  has been authorized for detection and/or diagnosis of SARS-CoV-2 by FDA under an Emergency Use Authorization (EUA). This EUA will remain  in effect (meaning this test can be used) for the duration of the COVID-19 declaration under Se ction 564(b)(1) of the Act, 21 U.S.C. section 360bbb-3(b)(1), unless the authorization is terminated or revoked sooner.  Performed at [facility], [address], [place], [place] [address]      Time coordinating discharge: 35 minutes  SIGNED: Lanae Boast, MD  [facility] 10/05/2020, 1:56 PM  If 7PM-7AM, please contact night-coverage [email or web address]

## 2020-10-03 NOTE — Progress Notes (Signed)
PROGRESS NOTE    Joanne FergusonJane C Oconnell  HYQ:[id number] DOB: 1946-05-26 DOA: 09/29/2020 PCP: Georgianne Fickamachandran, Ajith, MD   Chief Complaint  Patient presents with  . Leg Injury  . Fall  . Hip Pain  Brief Narrative: [age] old female with hypertension diabetes mellitus COPD, TIA, Crohn's disease who had a fall when picking up groceries after she slipped on the mat and fell on the floor, without loss of consciousness.  In the ED had left hip fracture seen on the x-rays orthopedic was consulted labs reviewed stable EKG with sinus tachycardia and LVH Covid negative.  Patient was admitted for operative intervention Patient is status post IM fixation of left femur 1/28.  Working with PT OT-skilled nursing facility recommended.  Subjective:  Pain is mild to moderate, no new complaints.  Hemoglobin is stable.    Assessment & Plan:  Closed left hip fracture initial encounter secondary to mechanical fall: s/p  IM fixation of left femur  1/28.  Continue Lovenox for DVT prophylaxis and on discharge aspirin 81 mg twice daily as per orthopedics. -Continue WBAT,defer to orthopedic for outpatient DVT prophylaxis and pain management prescription.  Awaiting for skilled nursing facility.  Acute blood loss anemia in the setting of anemia of chronic disease.  H&H is stable transfuse if less than 7 g. On admission 12.5 g. Recent Labs  Lab 09/29/20 1850 09/30/20 1225 10/01/20 0156 10/02/20 0227 10/03/20 0424  HGB 12.4 10.8* 9.7* 8.7* 9.3*  HCT 41.4 35.8* 30.4* 29.1* 29.3*   Type 2 diabetes mellitus sugar well controlled Recent Labs  Lab 10/02/20 1946 10/03/20 0046 10/03/20 0417 10/03/20 0852 10/03/20 0857  GLUCAP 176* 183* 163* 136* 129*   Hypokalemia will replete with oral potassium chloride this morning  Hypertension poorly controlled likely due to postop and pain.  Blood pressure stable now.  Continue amlodipine 5 mg and prn hydralazine.   Hyperlipidemia on statin  COPD not in exacerbation not wheezing  today, on room air.  Continue her home  Anoro Ellipta, prn albuterol.  Encourage IS again  Nutrition: Diet Order            Diet Carb Modified Fluid consistency: Thin; Room service appropriate? Yes  Diet effective now                 Nutrition Problem: Increased nutrient needs Etiology: post-op healing Signs/Symptoms: estimated needs Interventions: MVI,Glucerna shake  Body mass index is 25.4 kg/m.  DVT prophylaxis: enoxaparin (LOVENOX) injection 40 mg Start: 10/01/20 0800 SCDs Start: 09/30/20 1146 Code Status:   Code Status: Full Code  Family Communication: plan of care discussed with patient at bedside.  Status is: Inpatient Remains inpatient appropriate because:Hip fracture and need for surgical intervention awaiting for placement  Dispo: The patient is from: Home                Anticipated d/c is to: SNF, TOC consulted              Anticipated d/c date is: once bed available              Patient currently is medically stable for discharge   Difficult to place patient No  Consultants:see note  Procedures:see note  Culture/Microbiology    Component Value Date/Time   SDES  06/22/2018 1430    FLUID Performed at [facility] [place], [address] [place], [place] [id number]    [facility]  06/22/2018 1430    RIGHT KNEE Performed at [facility] [place], [id number]  [address] [place], [place] [address]    CULT  06/22/2018 1154    NO GROWTH 5 DAYS Performed at [facility], [address], [place], [place] [address]    REPTSTATUS 06/22/2018 FINAL 06/22/2018 1430    Other culture-see note  Medications: Scheduled Meds: . amLODipine  5 mg Oral Daily  . aspirin EC  81 mg Oral Daily  . buPROPion  300 mg Oral Daily  . calcium carbonate  1,250 mg Oral QAC supper  . cholecalciferol  1,000 Units Oral Daily  . docusate sodium  100 mg Oral BID  . enoxaparin (LOVENOX) injection  40 mg Subcutaneous Q24H  . feeding supplement (GLUCERNA SHAKE)  237 mL Oral TID BM   . insulin aspart  0-9 Units Subcutaneous Q4H  . levothyroxine  50 mcg Oral Q0600  . multivitamin with minerals  1 tablet Oral Daily  . omega-3 acid ethyl esters  1 g Oral Daily  . rosuvastatin  40 mg Oral Daily  . senna  1 tablet Oral BID  . umeclidinium-vilanterol  1 puff Inhalation Daily  . vitamin E  100 Units Oral Daily   Continuous Infusions: . sodium chloride Stopped (09/30/20 1939)  . methocarbamol (ROBAXIN) IV      Antimicrobials: Anti-infectives (From admission, onward)   Start     Dose/Rate Route Frequency Ordered Stop   09/30/20 1430  ceFAZolin (ANCEF) IVPB 2g/100 mL premix        2 g 200 mL/hr over 30 Minutes Intravenous Every 6 hours 09/30/20 1146 09/30/20 2009   09/30/20 0830  ceFAZolin (ANCEF) IVPB 2g/100 mL premix        2 g 200 mL/hr over 30 Minutes Intravenous On call to O.R. 09/30/20 0815 09/30/20 0848   09/30/20 0820  ceFAZolin (ANCEF) 2-4 GM/100ML-% IVPB       Note to Pharmacy: Tawanna Sat   : cabinet override      09/30/20 0820 09/30/20 0849     Objective: Vitals: Today's Vitals   10/03/20 0346 10/03/20 0521 10/03/20 0756 10/03/20 0902  BP: (!) 148/77  (!) 153/71   Pulse: 97  95   Resp: 16  17   Temp: 98.4 F (36.9 C)  98.5 F (36.9 C)   TempSrc: Oral  Oral   SpO2: 97%  95% 95%  Weight:      Height:      PainSc:  8       Intake/Output Summary (Last 24 hours) at 10/03/2020 1009 Last data filed at 10/03/2020 0500 Gross per 24 hour  Intake --  Output 700 ml  Net -700 ml   Filed Weights   09/29/20 1802  Weight: 67.1 kg   Weight change:   Intake/Output from previous day: 01/30 0701 - 01/31 0700 In: -  Out: 700 [Urine:700] Intake/Output this shift: No intake/output data recorded. Filed Weights   09/29/20 1802  Weight: 67.1 kg    Examination: General exam: AAOX3 , NAD, weak appearing. HEENT:Oral mucosa moist, Ear/Nose WNL grossly, dentition normal. Respiratory system: bilaterally clear,no wheezing or crackles,no use of accessory  muscle Cardiovascular system: S1 & S2 +, No JVD,. Gastrointestinal system: Abdomen soft, NT,ND, BS+ Nervous System:Alert, awake, moving extremities and grossly nonfocal Extremities: Left hip surgical site with 2 Aquacel dressing in place , distal peripheral pulses palpable.  Skin: No rashes,no icterus. MSK: Normal muscle bulk,tone, power   Data Reviewed: I have personally reviewed following labs and imaging studies CBC: Recent Labs  Lab 09/29/20 1850 09/30/20 1225  10/01/20 0156 10/02/20 0227 10/03/20 0424  WBC 9.2 14.3* 10.5 7.7 7.4  HGB 12.4 10.8* 9.7* 8.7* 9.3*  HCT 41.4 35.8* 30.4* 29.1* 29.3*  MCV 87.2 86.9 85.4 87.9 85.2  PLT 336 310 277 242 278   Basic Metabolic Panel: Recent Labs  Lab 09/29/20 1850 09/30/20 1225 10/01/20 0156 10/02/20 0227 10/03/20 0424  NA 139  --  138 139 139  K 3.9  --  3.7 3.8 3.3*  CL 100  --  102 98 97*  CO2 26  --  25 29 30   GLUCOSE 204*  --  148* 157* 166*  BUN 22  --  11 10 14   CREATININE 0.98 0.87 0.84 0.88 0.86  CALCIUM 9.7  --  9.1 9.4 9.4   GFR: Estimated Creatinine Clearance: 54.1 mL/min (by C-G formula based on SCr of 0.86 mg/dL). Liver Function Tests: No results for input(s): AST, ALT, ALKPHOS, BILITOT, PROT, ALBUMIN in the last 168 hours. No results for input(s): LIPASE, AMYLASE in the last 168 hours. No results for input(s): AMMONIA in the last 168 hours. Coagulation Profile: No results for input(s): INR, PROTIME in the last 168 hours. Cardiac Enzymes: No results for input(s): CKTOTAL, CKMB, CKMBINDEX, TROPONINI in the last 168 hours. BNP (last 3 results) No results for input(s): PROBNP in the last 8760 hours. HbA1C: No results for input(s): HGBA1C in the last 72 hours. CBG: Recent Labs  Lab 10/02/20 1946 10/03/20 0046 10/03/20 0417 10/03/20 0852 10/03/20 0857  GLUCAP 176* 183* 163* 136* 129*   Lipid Profile: No results for input(s): CHOL, HDL, LDLCALC, TRIG, CHOLHDL, LDLDIRECT in the last 72 hours. Thyroid  Function Tests: No results for input(s): TSH, T4TOTAL, FREET4, T3FREE, THYROIDAB in the last 72 hours. Anemia Panel: No results for input(s): VITAMINB12, FOLATE, FERRITIN, TIBC, IRON, RETICCTPCT in the last 72 hours. Sepsis Labs: No results for input(s): PROCALCITON, LATICACIDVEN in the last 168 hours.  Recent Results (from the past 240 hour(s))  SARS CORONAVIRUS 2 (TAT 6-24 HRS) Nasopharyngeal Nasopharyngeal Swab     Status: None   Collection Time: 09/29/20  9:00 PM   Specimen: Nasopharyngeal Swab  Result Value Ref Range Status   SARS Coronavirus 2 NEGATIVE NEGATIVE Final    Comment: (NOTE) SARS-CoV-2 target nucleic acids are NOT DETECTED.  The SARS-CoV-2 RNA is generally detectable in upper and lower respiratory specimens during the acute phase of infection. Negative results do not preclude SARS-CoV-2 infection, do not rule out co-infections with other pathogens, and should not be used as the sole basis for treatment or other patient management decisions. Negative results must be combined with clinical observations, patient history, and epidemiological information. The expected result is Negative.  Fact Sheet for Patients: 10/05/20  Fact Sheet for Healthcare Providers: 10/01/20  This test is not yet approved or cleared by the [employer] FDA and  has been authorized for detection and/or diagnosis of SARS-CoV-2 by FDA under an Emergency Use Authorization (EUA). This EUA will remain  in effect (meaning this test can be used) for the duration of the COVID-19 declaration under Se ction 564(b)(1) of the Act, 21 U.S.C. section 360bbb-3(b)(1), unless the authorization is terminated or revoked sooner.  Performed at [facility], [address] [place], [address] [place]   Surgical pcr screen     Status: None   Collection Time: 09/30/20  2:13 AM   Specimen: Nasal Mucosa; Nasal Swab  Result Value Ref Range  Status   MRSA, PCR NEGATIVE NEGATIVE Final  Staphylococcus aureus NEGATIVE NEGATIVE Final    Comment: (NOTE) The Xpert SA Assay (FDA approved for NASAL specimens in patients 96 years of age and older), is one component of a comprehensive surveillance program. It is not intended to diagnose infection nor to guide or monitor treatment. Performed at [facility], [address], [place], [place] [address]      Radiology Studies: No results found.   LOS: 4 days   Lanae Boast, MD [facility]  10/03/2020, 10:09 AM

## 2020-10-04 LAB — GLUCOSE, CAPILLARY
Glucose-Capillary: 156 mg/dL — ABNORMAL HIGH (ref 70–99)
Glucose-Capillary: 166 mg/dL — ABNORMAL HIGH (ref 70–99)
Glucose-Capillary: 174 mg/dL — ABNORMAL HIGH (ref 70–99)
Glucose-Capillary: 186 mg/dL — ABNORMAL HIGH (ref 70–99)
Glucose-Capillary: 234 mg/dL — ABNORMAL HIGH (ref 70–99)
Glucose-Capillary: 275 mg/dL — ABNORMAL HIGH (ref 70–99)

## 2020-10-04 MED ORDER — INSULIN ASPART 100 UNIT/ML ~~LOC~~ SOLN
5.0000 [IU] | Freq: Three times a day (TID) | SUBCUTANEOUS | Status: DC
  Administered 2020-10-04 – 2020-10-05 (×4): 5 [IU] via SUBCUTANEOUS

## 2020-10-04 NOTE — Progress Notes (Signed)
Physical Therapy Treatment Patient Details Name: [name] MRN: [id number] DOB: [birth year] Today's Date: 10/04/2020    History of Present Illness Pt is a 75 y.o. female admitted 09/29/20 after fall at home sustaining L intertrochanteric femur fx. S/p L femur IMN 1/28. PMH includes HTN, DM, COPD, TIA.   PT Comments    Pt remains limited by significant pain this afternoon. Also noted to have symptomatic hypotension with standing activity; post-ambulation BP 99/56, back up to 126/70s seated with legs reclined. Pt remains hopeful for d/c home tomorrow - hopeful her pain and activity tolerance will be improved.     Follow Up Recommendations  Home health PT;Supervision/Assistance - 24 hour     Equipment Recommendations  Rolling walker with 5" wheels;3in1 (PT)    Recommendations for Other Services       Precautions / Restrictions Precautions Precautions: Fall;Other (comment) Precaution Comments: Hypotension with activity Restrictions Weight Bearing Restrictions: Yes LLE Weight Bearing: Weight bearing as tolerated    Mobility  Bed Mobility Overal bed mobility: Needs Assistance Bed Mobility: Supine to Sit     Supine to sit: Supervision;HOB elevated     General bed mobility comments: Educ pt on use of RLE to assist LLE to EOB, pt able to perform well; increased time and effort due to pain  Transfers Overall transfer level: Needs assistance Equipment used: Rolling walker (2 wheeled) Transfers: Sit to/from Stand Sit to Stand: Min guard         General transfer comment: Poor eccentric control into sitting, limited by pain  Ambulation/Gait Ambulation/Gait assistance: Min guard Gait Distance (Feet): 14 Feet Assistive device: Rolling walker (2 wheeled) Gait Pattern/deviations: Decreased stride length;Antalgic;Trunk flexed;Step-to pattern Gait velocity: Decreased   General Gait Details: Slow, very antalgic steps with and min guard; improved foot clearance after a few  steps; cues for sequencing; very limited by pain as well as c/o dizziness, post-ambulatino BP 99/56   Stairs             Wheelchair Mobility    Modified Rankin (Stroke Patients Only)       Balance Overall balance assessment: Needs assistance Sitting-balance support: Feet supported Sitting balance-Leahy Scale: Fair     Standing balance support: Bilateral upper extremity supported;During functional activity Standing balance-Leahy Scale: Poor Standing balance comment: reliant on UE support                            Cognition Arousal/Alertness: Awake/alert Behavior During Therapy: WFL for tasks assessed/performed Overall Cognitive Status: Within Functional Limits for tasks assessed                                        Exercises      General Comments        Pertinent Vitals/Pain Pain Assessment: Faces Faces Pain Scale: Hurts whole lot Pain Location: LLE Pain Descriptors / Indicators: Guarding;Grimacing Pain Intervention(s): Limited activity within patient's tolerance;Monitored during session;Repositioned    Home Living                      Prior Function            PT Goals (current goals can now be found in the care plan section) Progress towards PT goals: Progressing toward goals    Frequency    Min 5X/week      PT Plan  Current plan remains appropriate    Co-evaluation              AM-PAC PT "6 Clicks" Mobility   Outcome Measure  Help needed turning from your back to your side while in a flat bed without using bedrails?: A Little Help needed moving from lying on your back to sitting on the side of a flat bed without using bedrails?: A Little Help needed moving to and from a bed to a chair (including a wheelchair)?: A Little Help needed standing up from a chair using your arms (e.g., wheelchair or bedside chair)?: A Little Help needed to walk in hospital room?: A Little Help needed climbing 3-5  steps with a railing? : A Little 6 Click Score: 18    End of Session Equipment Utilized During Treatment: Gait belt Activity Tolerance: Patient limited by pain Patient left: in chair;with call bell/phone within reach Nurse Communication: Mobility status PT Visit Diagnosis: Unsteadiness on feet (R26.81);Muscle weakness (generalized) (M62.81);History of falling (Z91.81);Other abnormalities of gait and mobility (R26.89)     Time: 1600-1620 PT Time Calculation (min) (ACUTE ONLY): 20 min  Charges:  $Gait Training: 8-22 mins                    Ina Homes, PT, DPT Acute Rehabilitation Services  Pager [phone] Office [phone]  Malachy Chamber 10/04/2020, 5:18 PM

## 2020-10-04 NOTE — Plan of Care (Signed)
  Problem: Health Behavior/Discharge Planning: Goal: Ability to manage health-related needs will improve Outcome: Progressing   Problem: Activity: Goal: Risk for activity intolerance will decrease Outcome: Progressing   Problem: Safety: Goal: Ability to remain free from injury will improve Outcome: Progressing   

## 2020-10-04 NOTE — Progress Notes (Signed)
Physical Therapy Treatment Patient Details Name: [name] MRN: [id number] DOB: 02/28/1946 Today's Date: 10/04/2020    History of Present Illness Pt is a 75 y.o. female admitted 09/29/20 after fall at home sustaining L intertrochanteric femur fx. S/p L femur IMN 1/28. PMH includes HTN, DM, COPD, TIA.   PT Comments    Pt progressing with mobility, hopeful for d/c home tomorrow. Today's session focused on stair training with RW, pt requiring minA for stability. Further mobility limited by c/o dizziness and "loopiness" having recently received pain meds. Will plan for additional gait/stair training with RW tomorrow. Pt reports she will have necessary assist from family upon return home.    Follow Up Recommendations  Home health PT;Supervision/Assistance - 24 hour     Equipment Recommendations  Rolling walker with 5" wheels;3in1 (PT)    Recommendations for Other Services       Precautions / Restrictions Precautions Precautions: Fall Restrictions Weight Bearing Restrictions: Yes LLE Weight Bearing: Weight bearing as tolerated    Mobility  Bed Mobility Overal bed mobility: Needs Assistance Bed Mobility: Supine to Sit;Sit to Supine     Supine to sit: Supervision;HOB elevated Sit to supine: Min assist   General bed mobility comments: Increased time and effort to bring LLE to EOB due to pain; minA for LLE management with return to supine. With bed flat, pt able to pull herself up well with bed rails for repositioning  Transfers Overall transfer level: Needs assistance Equipment used: Rolling walker (2 wheeled) Transfers: Sit to/from Stand Sit to Stand: Min guard            Ambulation/Gait Ambulation/Gait assistance: Min guard Gait Distance (Feet): 5 Feet Assistive device: Rolling walker (2 wheeled) Gait Pattern/deviations: Step-to pattern;Decreased stride length;Antalgic;Trunk flexed Gait velocity: Decreased   General Gait Details: Slow, antalgic steps with RW and  min guard; limited distance secondary to focus on stair training   Stairs Stairs: Yes Stairs assistance: Min assist Stair Management: Step to pattern;Forwards Number of Stairs: 1 General stair comments: Demonstrated ascending platform step with RW forwards and backwards, pt wanted to try forwards technique first; ultimately with difficulty getting enough weight offloaded with RW on awkward angle on step, requiring minA to maintain stability with step up; additional trials deferred secondary to dizziness needing to return to supine (pt attributes to receiving pain meds recently)   Wheelchair Mobility    Modified Rankin (Stroke Patients Only)       Balance Overall balance assessment: Needs assistance Sitting-balance support: Feet supported Sitting balance-Leahy Scale: Fair     Standing balance support: Bilateral upper extremity supported;During functional activity Standing balance-Leahy Scale: Poor Standing balance comment: reliant on UE support                            Cognition Arousal/Alertness: Awake/alert Behavior During Therapy: WFL for tasks assessed/performed Overall Cognitive Status: Within Functional Limits for tasks assessed                                        Exercises      General Comments General comments (skin integrity, edema, etc.): Pt reports very active morning, walking with nursing around room, including up to bathroom, sink and sitting in recliner for 3 hrs this morning. Pt recently returned to bed with increased fatigue having received pain meds prior to session. Pt hopeful for  d/c home tomorrow, will have assist from family      Pertinent Vitals/Pain Pain Assessment: 0-10 Pain Score: 8  Pain Location: L hip/anterolateral thigh Pain Descriptors / Indicators: Guarding;Grimacing Pain Intervention(s): Limited activity within patient's tolerance;Premedicated before session;Repositioned;Ice applied    Home Living                       Prior Function            PT Goals (current goals can now be found in the care plan section) Progress towards PT goals: Progressing toward goals    Frequency    Min 5X/week      PT Plan Current plan remains appropriate    Co-evaluation              AM-PAC PT "6 Clicks" Mobility   Outcome Measure  Help needed turning from your back to your side while in a flat bed without using bedrails?: A Little Help needed moving from lying on your back to sitting on the side of a flat bed without using bedrails?: A Little Help needed moving to and from a bed to a chair (including a wheelchair)?: A Little Help needed standing up from a chair using your arms (e.g., wheelchair or bedside chair)?: A Little Help needed to walk in hospital room?: A Little Help needed climbing 3-5 steps with a railing? : A Little 6 Click Score: 18    End of Session Equipment Utilized During Treatment: Gait belt Activity Tolerance: Patient tolerated treatment well;Patient limited by pain;Other (comment) (dizziness/"loopiness" after pain meds) Patient left: in bed;with call bell/phone within reach;with bed alarm set Nurse Communication: Mobility status PT Visit Diagnosis: Unsteadiness on feet (R26.81);Muscle weakness (generalized) (M62.81);History of falling (Z91.81);Other abnormalities of gait and mobility (R26.89)     Time: 0175-1025 PT Time Calculation (min) (ACUTE ONLY): 16 min  Charges:  $Gait Training: 8-22 mins                     Ina Homes, PT, DPT Acute Rehabilitation Services  Pager [phone] Office [phone]  Malachy Chamber 10/04/2020, 12:33 PM

## 2020-10-04 NOTE — Plan of Care (Signed)

## 2020-10-04 NOTE — Progress Notes (Signed)
PROGRESS NOTE    [name]  OHY:[id number] DOB: 07-01-1946 DOA: 09/29/2020 PCP: Georgianne Fick, MD   Chief Complaint  Patient presents with  . Leg Injury  . Fall  . Hip Pain  Brief Narrative: 75 old female with hypertension diabetes mellitus COPD, TIA, Crohn's disease who had a fall when picking up groceries after she slipped on the mat and fell on the floor, without loss of consciousness.  In the ED had left hip fracture seen on the x-rays orthopedic was consulted labs reviewed stable EKG with sinus tachycardia and LVH Covid negative.  Patient was admitted for operative intervention Patient is status post IM fixation of left femur 1/28.  Working with PT OT-skilled nursing facility recommended. Patient showing improvement and she wants to go home with home health services nursing facility  Subjective: Resting comfortably in the bedside chair.  Still having moderate pain.  She feels she can handle at home but she would like to see how she does 1 more day in the hospital before making decision on going home with home health tomorrow.  Assessment & Plan:  Closed left hip fracture initial encounter secondary to mechanical fall: s/p  IM fixation of left femur  1/28.  On Lovenox for DVT prophylaxis.  Plan is for aspirin 81 mg twice daily as per orthopedics.  Continue to mobilize more with PT OT.  Plan for home health tomorrow  Acute blood loss anemia in the setting of anemia of chronic disease.  Hemoglobin stable 9.3 g.  Check CBC in 1 week. On admission 12.5 g. Recent Labs  Lab 09/29/20 1850 09/30/20 1225 10/01/20 0156 10/02/20 0227 10/03/20 0424  HGB 12.4 10.8* 9.7* 8.7* 9.3*  HCT 41.4 35.8* 30.4* 29.1* 29.3*   Type 2 diabetes mellitus uncontrolled hyperglycemia in 200s, add on premeal insulin. Recent Labs  Lab 10/03/20 1938 10/04/20 0013 10/04/20 0438 10/04/20 0758 10/04/20 1203  GLUCAP 174* 156* 186* 234* 275*   Hypokalemia resolved.  Hypertension blood pressure  fairly stable on home amlodipine 5 mg.  Cont prn hydralazine.   Hyperlipidemia continue statin  COPD not in exacerbation mild intermittent wheezing, she will continue on Anoro Ellipta, prn albuterol.  Encourage IS again  Nutrition: Diet Order            Diet Carb Modified Fluid consistency: Thin; Room service appropriate? Yes  Diet effective now                 Nutrition Problem: Increased nutrient needs Etiology: post-op healing Signs/Symptoms: estimated needs Interventions: MVI,Glucerna shake  Body mass index is 25.4 kg/m.  DVT prophylaxis: enoxaparin (LOVENOX) injection 40 mg Start: 10/01/20 0800 SCDs Start: 09/30/20 1146 Code Status:   Code Status: Full Code  Family Communication: plan of care discussed with patient at bedside.  Status is: Inpatient Remains inpatient appropriate because:Hip fracture and need for surgical intervention awaiting for placement  Dispo: The patient is from: Home                Anticipated d/c is to: HHPT              Anticipated d/c date is: Tomorrow              Patient currently is not medically stable for discharge   Difficult to place patient No  Consultants:see note  Procedures:see note  Culture/Microbiology    Component Value Date/Time   SDES  06/22/2018 1430    FLUID Performed at [facility], [address] [place], [place] [address]    [facility]  06/22/2018 1430    RIGHT KNEE Performed at [facility], [address] [place], [place] [address]    CULT  06/22/2018 1154    NO GROWTH 5 DAYS Performed at [facility], [address] [place], [place] [address]    REPTSTATUS 06/22/2018 FINAL 06/22/2018 1430    Other culture-see note  Medications: Scheduled Meds: . amLODipine  5 mg Oral Daily  . aspirin EC  81 mg Oral Daily  . buPROPion  300 mg Oral Daily  . calcium carbonate  1,250 mg Oral QAC supper  . cholecalciferol  1,000 Units Oral Daily  . docusate sodium  100 mg Oral BID  .  enoxaparin (LOVENOX) injection  40 mg Subcutaneous Q24H  . feeding supplement (GLUCERNA SHAKE)  237 mL Oral TID BM  . insulin aspart  0-9 Units Subcutaneous TID WC  . levothyroxine  50 mcg Oral Q0600  . multivitamin with minerals  1 tablet Oral Daily  . omega-3 acid ethyl esters  1 g Oral Daily  . rosuvastatin  40 mg Oral Daily  . senna  1 tablet Oral BID  . umeclidinium-vilanterol  1 puff Inhalation Daily  . vitamin E  100 Units Oral Daily   Continuous Infusions: . methocarbamol (ROBAXIN) IV      Antimicrobials: Anti-infectives (From admission, onward)   Start     Dose/Rate Route Frequency Ordered Stop   09/30/20 1430  ceFAZolin (ANCEF) IVPB 2g/100 mL premix        2 g 200 mL/hr over 30 Minutes Intravenous Every 6 hours 09/30/20 1146 09/30/20 2009   09/30/20 0830  ceFAZolin (ANCEF) IVPB 2g/100 mL premix        2 g 200 mL/hr over 30 Minutes Intravenous On call to O.R. 09/30/20 0815 09/30/20 0848   09/30/20 0820  ceFAZolin (ANCEF) 2-4 GM/100ML-% IVPB       Note to Pharmacy: Tawanna Sat   : cabinet override      09/30/20 0820 09/30/20 0849     Objective: Vitals: Today's Vitals   10/04/20 0756 10/04/20 0817 10/04/20 1002 10/04/20 1317  BP: 132/60   (!) 159/69  Pulse: 97   90  Resp: 17     Temp: 98.3 F (36.8 C)   (!) 97.5 F (36.4 C)  TempSrc: Oral   Oral  SpO2: 94% 94%  96%  Weight:      Height:      PainSc:   7     No intake or output data in the 24 hours ending 10/04/20 1350 Filed Weights   09/29/20 1802  Weight: 67.1 kg   Weight change:   Intake/Output from previous day: No intake/output data recorded. Intake/Output this shift: No intake/output data recorded. Filed Weights   09/29/20 1802  Weight: 67.1 kg    Examination:  General exam: AAOx3 , NAD, weak appearing. HEENT:Oral mucosa moist, Ear/Nose WNL grossly, dentition normal. Respiratory system: bilaterally clear,no wheezing or crackles,no use of accessory muscle Cardiovascular system: S1 & S2 +,  No JVD,. Gastrointestinal system: Abdomen soft, NT,ND, BS+ Nervous System:Alert, awake, moving extremities and grossly nonfocal Extremities: No edema, left hip surgical site with intact dressing, distal peripheral pulses palpable.  Skin: No rashes,no icterus. MSK: Normal muscle bulk,tone, power   Data Reviewed: I have personally reviewed following labs and imaging studies CBC: Recent Labs  Lab 09/29/20 1850 09/30/20 1225 10/01/20 0156 10/02/20 0227 10/03/20 0424  WBC 9.2  14.3* 10.5 7.7 7.4  HGB 12.4 10.8* 9.7* 8.7* 9.3*  HCT 41.4 35.8* 30.4* 29.1* 29.3*  MCV 87.2 86.9 85.4 87.9 85.2  PLT 336 310 277 242 278   Basic Metabolic Panel: Recent Labs  Lab 09/29/20 1850 09/30/20 1225 10/01/20 0156 10/02/20 0227 10/03/20 0424  NA 139  --  138 139 139  K 3.9  --  3.7 3.8 3.3*  CL 100  --  102 98 97*  CO2 26  --  25 29 30   GLUCOSE 204*  --  148* 157* 166*  BUN 22  --  11 10 14   CREATININE 0.98 0.87 0.84 0.88 0.86  CALCIUM 9.7  --  9.1 9.4 9.4   GFR: Estimated Creatinine Clearance: 54.1 mL/min (by C-G formula based on SCr of 0.86 mg/dL). Liver Function Tests: No results for input(s): AST, ALT, ALKPHOS, BILITOT, PROT, ALBUMIN in the last 168 hours. No results for input(s): LIPASE, AMYLASE in the last 168 hours. No results for input(s): AMMONIA in the last 168 hours. Coagulation Profile: No results for input(s): INR, PROTIME in the last 168 hours. Cardiac Enzymes: No results for input(s): CKTOTAL, CKMB, CKMBINDEX, TROPONINI in the last 168 hours. BNP (last 3 results) No results for input(s): PROBNP in the last 8760 hours. HbA1C: No results for input(s): HGBA1C in the last 72 hours. CBG: Recent Labs  Lab 10/03/20 1938 10/04/20 0013 10/04/20 0438 10/04/20 0758 10/04/20 1203  GLUCAP 174* 156* 186* 234* 275*   Lipid Profile: No results for input(s): CHOL, HDL, LDLCALC, TRIG, CHOLHDL, LDLDIRECT in the last 72 hours. Thyroid Function Tests: No results for input(s): TSH,  T4TOTAL, FREET4, T3FREE, THYROIDAB in the last 72 hours. Anemia Panel: No results for input(s): VITAMINB12, FOLATE, FERRITIN, TIBC, IRON, RETICCTPCT in the last 72 hours. Sepsis Labs: No results for input(s): PROCALCITON, LATICACIDVEN in the last 168 hours.  Recent Results (from the past 240 hour(s))  SARS CORONAVIRUS 2 (TAT 6-24 HRS) Nasopharyngeal Nasopharyngeal Swab     Status: None   Collection Time: 09/29/20  9:00 PM   Specimen: Nasopharyngeal Swab  Result Value Ref Range Status   SARS Coronavirus 2 NEGATIVE NEGATIVE Final    Comment: (NOTE) SARS-CoV-2 target nucleic acids are NOT DETECTED.  The SARS-CoV-2 RNA is generally detectable in upper and lower respiratory specimens during the acute phase of infection. Negative results do not preclude SARS-CoV-2 infection, do not rule out co-infections with other pathogens, and should not be used as the sole basis for treatment or other patient management decisions. Negative results must be combined with clinical observations, patient history, and epidemiological information. The expected result is Negative.  Fact Sheet for Patients: 12/02/20  Fact Sheet for Healthcare Providers: 10/01/20  This test is not yet approved or cleared by the [employer] FDA and  has been authorized for detection and/or diagnosis of SARS-CoV-2 by FDA under an Emergency Use Authorization (EUA). This EUA will remain  in effect (meaning this test can be used) for the duration of the COVID-19 declaration under Se ction 564(b)(1) of the Act, 21 U.S.C. section 360bbb-3(b)(1), unless the authorization is terminated or revoked sooner.  Performed at [facility], [address], [place], [address] [place]   Surgical pcr screen     Status: None   Collection Time: 09/30/20  2:13 AM   Specimen: Nasal Mucosa; Nasal Swab  Result Value Ref Range Status   MRSA, PCR NEGATIVE NEGATIVE Final    Staphylococcus aureus NEGATIVE NEGATIVE Final    Comment: (  NOTE) The Xpert SA Assay (FDA approved for NASAL specimens in patients 15 years of age and older), is one component of a comprehensive surveillance program. It is not intended to diagnose infection nor to guide or monitor treatment. Performed at [facility], [address] [place], [place] [address]   SARS CORONAVIRUS 2 (TAT 6-24 HRS) Nasopharyngeal Nasopharyngeal Swab     Status: None   Collection Time: 10/03/20  5:13 PM   Specimen: Nasopharyngeal Swab  Result Value Ref Range Status   SARS Coronavirus 2 NEGATIVE NEGATIVE Final    Comment: (NOTE) SARS-CoV-2 target nucleic acids are NOT DETECTED.  The SARS-CoV-2 RNA is generally detectable in upper and lower respiratory specimens during the acute phase of infection. Negative results do not preclude SARS-CoV-2 infection, do not rule out co-infections with other pathogens, and should not be used as the sole basis for treatment or other patient management decisions. Negative results must be combined with clinical observations, patient history, and epidemiological information. The expected result is Negative.  Fact Sheet for Patients: [employer]  Fact Sheet for Healthcare Providers: quierodirigir.com  This test is not yet approved or cleared by the [place] FDA and  has been authorized for detection and/or diagnosis of SARS-CoV-2 by FDA under an Emergency Use Authorization (EUA). This EUA will remain  in effect (meaning this test can be used) for the duration of the COVID-19 declaration under Se ction 564(b)(1) of the Act, 21 U.S.C. section 360bbb-3(b)(1), unless the authorization is terminated or revoked sooner.  Performed at [facility], [address], [place], [place] [address]      Radiology Studies: No results found.   LOS: 5 days   Lanae Boast, MD [facility]  10/04/2020, 1:50  PM

## 2020-10-04 NOTE — Care Management Important Message (Signed)
Important Message  Patient Details  Name: [name] MRN: [id number] Date of Birth: Sep 03, 1946   Medicare Important Message Given:  Yes - Important Message mailed due to current National Emergency  Verbal consent obtained due to current National Emergency  Relationship to patient: Self Contact Name: Talullah Abate Call Date: 10/04/20  Time: 1516 Phone: [phone] Outcome: Spoke with contact Important Message mailed to: Patient address on file    Orson Aloe 10/04/2020, 3:17 PM

## 2020-10-05 LAB — GLUCOSE, CAPILLARY
Glucose-Capillary: 230 mg/dL — ABNORMAL HIGH (ref 70–99)
Glucose-Capillary: 220 mg/dL — ABNORMAL HIGH (ref 70–99)
Glucose-Capillary: 153 mg/dL — ABNORMAL HIGH (ref 70–99)

## 2020-10-05 NOTE — Plan of Care (Signed)
  Problem: Health Behavior/Discharge Planning: Goal: Ability to manage health-related needs will improve Outcome: Progressing   Problem: Activity: Goal: Risk for activity intolerance will decrease Outcome: Progressing   Problem: Pain Managment: Goal: General experience of comfort will improve Outcome: Progressing   

## 2020-10-05 NOTE — Progress Notes (Signed)
Physical Therapy Treatment Patient Details Name: [name] MRN: [id number] DOB: 10/21/1945 Today's Date: 10/05/2020    History of Present Illness Pt is a 75 y.o. female admitted 09/29/20 after fall at home sustaining L intertrochanteric femur fx. S/p L femur IMN 1/28. PMH includes HTN, DM, COPD, TIA.    PT Comments    Patient progressing very slowly towards PT goals. Continues to be limited by pain in LLE and dizziness. Requires min guard assist for standing and short distance ambulation with use of RW and Min guard assist. Difficulty advancing LLE and relies heavily on UEs during stance phase on left. Pt reports she would have difficulty functioning at home in this condition due to uncontrolled pain. Discussed importance of staying on top of pain meds esp initially to get this under control to be able to tolerate more activity. Tolerated there ex. RN notified of need for pain meds. Will continue to follow.    Follow Up Recommendations  Home health PT;Supervision/Assistance - 24 hour     Equipment Recommendations  Rolling walker with 5" wheels;3in1 (PT)    Recommendations for Other Services       Precautions / Restrictions Precautions Precautions: Fall;Other (comment) Precaution Comments: Hypotension with activity Restrictions Weight Bearing Restrictions: Yes LLE Weight Bearing: Weight bearing as tolerated    Mobility  Bed Mobility               General bed mobility comments: up in chair upon PT arrival.  Transfers Overall transfer level: Needs assistance Equipment used: Rolling walker (2 wheeled) Transfers: Sit to/from Stand Sit to Stand: Min guard         General transfer comment: Min guard for safety. Cues for hand placement/technique as pt trying to pull up on RW. Slow to rise due to pain and poor eccentric control into sitting.  Ambulation/Gait Ambulation/Gait assistance: Min guard Gait Distance (Feet): 8 Feet Assistive device: Rolling walker (2  wheeled) Gait Pattern/deviations: Decreased stride length;Antalgic;Trunk flexed;Step-to pattern;Decreased stance time - left;Decreased step length - right Gait velocity: Decreased   General Gait Details: Slow, antalgic steps with min guard; difficulty progressing LLE and with WB throgh LLE relying on UEs. Cues for sequencing; very limited by pain as well as c/o dizziness.   Stairs             Wheelchair Mobility    Modified Rankin (Stroke Patients Only)       Balance Overall balance assessment: Needs assistance Sitting-balance support: Feet supported;No upper extremity supported Sitting balance-Leahy Scale: Fair Sitting balance - Comments: able to sustain without UE support, prefers UE support to offweight pain   Standing balance support: During functional activity Standing balance-Leahy Scale: Poor Standing balance comment: reliant on UE support                            Cognition Arousal/Alertness: Awake/alert Behavior During Therapy: WFL for tasks assessed/performed Overall Cognitive Status: Within Functional Limits for tasks assessed                                 General Comments: Reports not feeling safe going home in this condition.      Exercises General Exercises - Lower Extremity Ankle Circles/Pumps: AROM;Both;10 reps;Seated Long Arc Quad: AROM;Left;5 reps;Seated Heel Raises: AROM;Both;10 reps;Seated    General Comments        Pertinent Vitals/Pain Pain Assessment: 0-10 Pain Score:  9  Pain Location: LLE Pain Descriptors / Indicators: Guarding;Grimacing;Operative site guarding Pain Intervention(s): Monitored during session;Repositioned;Limited activity within patient's tolerance;Patient requesting pain meds-RN notified;Premedicated before session    Home Living                      Prior Function            PT Goals (current goals can now be found in the care plan section) Progress towards PT goals: Not  progressing toward goals - comment (due to pain/dizziness)    Frequency    Min 5X/week      PT Plan Current plan remains appropriate    Co-evaluation              AM-PAC PT "6 Clicks" Mobility   Outcome Measure  Help needed turning from your back to your side while in a flat bed without using bedrails?: A Little Help needed moving from lying on your back to sitting on the side of a flat bed without using bedrails?: A Little Help needed moving to and from a bed to a chair (including a wheelchair)?: A Little Help needed standing up from a chair using your arms (e.g., wheelchair or bedside chair)?: A Little Help needed to walk in hospital room?: A Little Help needed climbing 3-5 steps with a railing? : A Little 6 Click Score: 18    End of Session Equipment Utilized During Treatment: Gait belt Activity Tolerance: Patient limited by pain Patient left: in chair;with call bell/phone within reach Nurse Communication: Mobility status PT Visit Diagnosis: Unsteadiness on feet (R26.81);Muscle weakness (generalized) (M62.81);History of falling (Z91.81);Other abnormalities of gait and mobility (R26.89);Pain Pain - Right/Left: Left Pain - part of body: Leg;Hip     Time: 7001-7494 PT Time Calculation (min) (ACUTE ONLY): 20 min  Charges:  $Therapeutic Activity: 8-22 mins                     Vale Haven, PT, DPT Acute Rehabilitation Services Pager [phone] Office [phone]       Blake Divine A Lanier Ensign 10/05/2020, 9:14 AM

## 2020-10-05 NOTE — TOC CAGE-AID Note (Signed)
Transition of Care [facility]) - CAGE-AID Screening   Patient Details  Name: [name] MRN: [id number] Date of Birth: May 04, 1946  Transition of Care [facility]) CM/SW Contact:    Mearl Latin, LCSW Phone Number: 10/05/2020, 11:33 AM   Clinical Narrative: Patient reports no alcohol or substance use. She reported being excited to be discharged home today.    CAGE-AID Screening:    Have You Ever Felt You Ought to Cut Down on Your Drinking or Drug Use?: No Have People Annoyed You By Critizing Your Drinking Or Drug Use?: No Have You Felt Bad Or Guilty About Your Drinking Or Drug Use?: No Have You Ever Had a Drink or Used Drugs First Thing In The Morning to Steady Your Nerves or to Get Rid of a Hangover?: No CAGE-AID Score: 0  Substance Abuse Education Offered: No  Substance abuse interventions: Other (must comment) (No alcohol use)

## 2020-10-05 NOTE — Progress Notes (Signed)
    Subjective:  Patient reports pain as mild to moderate.  Denies N/V/CP/SOB.   Objective:   VITALS:   Vitals:   10/04/20 1946 10/05/20 0411 10/05/20 0806 10/05/20 0816  BP: (!) 150/70 (!) 148/63 139/71   Pulse: 86 89 99 94  Resp: 18 17 17 16   Temp: (!) 97.5 F (36.4 C) 98.3 F (36.8 C) 97.8 F (36.6 C)   TempSrc: Oral Oral Oral   SpO2: 97% 93% 96% 93%  Weight:  68.1 kg    Height:        NAD ABD soft Neurovascular intact Sensation intact distally Intact pulses distally Dorsiflexion/Plantar flexion intact Incision: dressing C/D/I   Lab Results  Component Value Date   WBC 7.4 10/03/2020   HGB 9.3 (L) 10/03/2020   HCT 29.3 (L) 10/03/2020   MCV 85.2 10/03/2020   PLT 278 10/03/2020   BMET    Component Value Date/Time   NA 139 10/03/2020 0424   K 3.3 (L) 10/03/2020 0424   CL 97 (L) 10/03/2020 0424   CO2 30 10/03/2020 0424   GLUCOSE 166 (H) 10/03/2020 0424   BUN 14 10/03/2020 0424   CREATININE 0.86 10/03/2020 0424   CREATININE 0.90 11/03/2015 1045   CALCIUM 9.4 10/03/2020 0424   GFRNONAA >60 10/03/2020 0424   GFRAA 60 (L) 06/23/2018 1551     Assessment/Plan: 5 Days Post-Op   Principal Problem:   Closed left hip fracture (HCC) Active Problems:   Type 2 diabetes mellitus (HCC)   Essential hypertension   Closed left hip fracture, initial encounter (HCC)   WBAT with walker DVT ppx: Aspirin and Lovenox, SCDs, TEDS   ASA 81mg  BID at D/C ABLA: HgB 9.3, treat per hospitalist recommendations PO pain control PT/OT Dispo: D/C home with HHPT    06/25/2018 [name] 10/05/2020, 2:28 PM   [facility] is now 12/03/2020 [address], [place], [employer] [place] Phone: [phone] [email or web address] Facebook  [phone]

## 2020-10-05 NOTE — Progress Notes (Addendum)
Pt given discharge instructions and prescriptions. Gone over with her and she verbalized understanding. All questions answered. Pt waiting on daughter to bring her clothes and will transport pt home. Pt given walker and 3n1 from floor stock.

## 2020-10-05 NOTE — Progress Notes (Signed)
Nutrition Follow-up  DOCUMENTATION CODES:   Not applicable  INTERVENTION:   -Continue Glucerna Shake po TID, each supplement provides 220 kcal and 10 grams of protein -Continue MVI with minerals daily  NUTRITION DIAGNOSIS:   Increased nutrient needs related to post-op healing as evidenced by estimated needs.  Ongoing  GOAL:   Patient will meet greater than or equal to 90% of their needs  Progressing   MONITOR:   PO intake,Supplement acceptance,Diet advancement,Labs,Weight trends,Skin,I & O's  REASON FOR ASSESSMENT:   Consult Assessment of nutrition requirement/status,Hip fracture protocol  ASSESSMENT:   [name] is a 75 y.o. female with history of hypertension, diabetes mellitus, COPD, TIA, Crohn's disease per the chart had a fall at home when patient was picking up groceries and coming back home.  Patient states she slipped on the mat and fell on the floor denies hitting head or losing consciousness.  1/28- s/p PROCEDURE: Intramedullary fixation,Leftfemur.   Reviewed I/O's: -250 ml x 24 hours and +2.3 L since admission  UOP: 250 ml x 24 hours  Spoke with pt, who was sitting in recliner chair at time of visit. She reports decreased appetite post-operatively, estimating she is consuming about 25% of meals. Observed breakfast tray, noted meal completion about 10%.   Per pt, she had decreased appetite PTA, which has worsened post-operatively. She consumes 2-3 meals per day (Breakfast: small snack, Lunch: sandwich; Dinner: meat, starch, and vegetable). Pt also shares that she supplements with Slim Fast Drinks 1-2 times per day to assist with additional nutrition. She is consuming ordered supplements.   Pt shares that she has experienced a 6# wt loss PTA, which she attributes to decreased appetite.   Discussed with pt importance of good meal and supplement intake to promote healing. Pt looking forward to discharge today and reports her son and daughter will be  helping her at home.   Labs reviewed: CBGS: 166-275 (inpatient orders for glycemic control are 0-9 units insulin aspart TID with meals and 5 units insulin aspart TID with meals).   NUTRITION - FOCUSED PHYSICAL EXAM:  Flowsheet Row Most Recent Value  Orbital Region No depletion  Upper Arm Region No depletion  Thoracic and Lumbar Region No depletion  Buccal Region No depletion  Temple Region No depletion  Clavicle Bone Region No depletion  Clavicle and Acromion Bone Region No depletion  Scapular Bone Region No depletion  Dorsal Hand No depletion  Patellar Region No depletion  Anterior Thigh Region No depletion  Posterior Calf Region No depletion  Edema (RD Assessment) None  Hair Reviewed  Eyes Reviewed  Mouth Reviewed  Skin Reviewed  Nails Reviewed       Diet Order:   Diet Order            Diet Carb Modified Fluid consistency: Thin; Room service appropriate? Yes  Diet effective now                 EDUCATION NEEDS:   No education needs have been identified at this time  Skin:  Skin Assessment: Skin Integrity Issues: Skin Integrity Issues:: Incisions Incisions: closed lt thigh  Last BM:  Unknown  Height:   Ht Readings from Last 1 Encounters:  09/29/20 5\' 4"  (1.626 m)    Weight:   Wt Readings from Last 1 Encounters:  10/05/20 68.1 kg    Ideal Body Weight:  54.5 kg  BMI:  Body mass index is 25.77 kg/m.  Estimated Nutritional Needs:   Kcal:  1800-2000  Protein:  90-105 grams  Fluid:  > 1.8 L    Levada Schilling, RD, LDN, CDCES Registered Dietitian II Certified Diabetes Care and Education Specialist Please refer to [facility] for RD and/or RD on-call/weekend/after hours pager

## 2020-10-06 DIAGNOSIS — J449 Chronic obstructive pulmonary disease, unspecified: Secondary | ICD-10-CM | POA: Diagnosis not present

## 2020-10-06 DIAGNOSIS — E785 Hyperlipidemia, unspecified: Secondary | ICD-10-CM | POA: Diagnosis not present

## 2020-10-06 DIAGNOSIS — I10 Essential (primary) hypertension: Secondary | ICD-10-CM | POA: Diagnosis not present

## 2020-10-06 DIAGNOSIS — K219 Gastro-esophageal reflux disease without esophagitis: Secondary | ICD-10-CM | POA: Diagnosis not present

## 2020-10-06 DIAGNOSIS — K5090 Crohn's disease, unspecified, without complications: Secondary | ICD-10-CM | POA: Diagnosis not present

## 2020-10-06 DIAGNOSIS — S72142D Displaced intertrochanteric fracture of left femur, subsequent encounter for closed fracture with routine healing: Secondary | ICD-10-CM | POA: Diagnosis not present

## 2020-10-06 DIAGNOSIS — D62 Acute posthemorrhagic anemia: Secondary | ICD-10-CM | POA: Diagnosis not present

## 2020-10-06 DIAGNOSIS — E119 Type 2 diabetes mellitus without complications: Secondary | ICD-10-CM | POA: Diagnosis not present

## 2020-10-06 DIAGNOSIS — M1612 Unilateral primary osteoarthritis, left hip: Secondary | ICD-10-CM | POA: Diagnosis not present

## 2020-10-07 DIAGNOSIS — K219 Gastro-esophageal reflux disease without esophagitis: Secondary | ICD-10-CM | POA: Diagnosis not present

## 2020-10-07 DIAGNOSIS — K5090 Crohn's disease, unspecified, without complications: Secondary | ICD-10-CM | POA: Diagnosis not present

## 2020-10-07 DIAGNOSIS — E119 Type 2 diabetes mellitus without complications: Secondary | ICD-10-CM | POA: Diagnosis not present

## 2020-10-07 DIAGNOSIS — E785 Hyperlipidemia, unspecified: Secondary | ICD-10-CM | POA: Diagnosis not present

## 2020-10-07 DIAGNOSIS — J449 Chronic obstructive pulmonary disease, unspecified: Secondary | ICD-10-CM | POA: Diagnosis not present

## 2020-10-07 DIAGNOSIS — S72142D Displaced intertrochanteric fracture of left femur, subsequent encounter for closed fracture with routine healing: Secondary | ICD-10-CM | POA: Diagnosis not present

## 2020-10-07 DIAGNOSIS — M1612 Unilateral primary osteoarthritis, left hip: Secondary | ICD-10-CM | POA: Diagnosis not present

## 2020-10-07 DIAGNOSIS — I10 Essential (primary) hypertension: Secondary | ICD-10-CM | POA: Diagnosis not present

## 2020-10-07 DIAGNOSIS — D62 Acute posthemorrhagic anemia: Secondary | ICD-10-CM | POA: Diagnosis not present

## 2020-10-11 DIAGNOSIS — K5090 Crohn's disease, unspecified, without complications: Secondary | ICD-10-CM | POA: Diagnosis not present

## 2020-10-11 DIAGNOSIS — E785 Hyperlipidemia, unspecified: Secondary | ICD-10-CM | POA: Diagnosis not present

## 2020-10-11 DIAGNOSIS — K219 Gastro-esophageal reflux disease without esophagitis: Secondary | ICD-10-CM | POA: Diagnosis not present

## 2020-10-11 DIAGNOSIS — D62 Acute posthemorrhagic anemia: Secondary | ICD-10-CM | POA: Diagnosis not present

## 2020-10-11 DIAGNOSIS — S72142D Displaced intertrochanteric fracture of left femur, subsequent encounter for closed fracture with routine healing: Secondary | ICD-10-CM | POA: Diagnosis not present

## 2020-10-11 DIAGNOSIS — J449 Chronic obstructive pulmonary disease, unspecified: Secondary | ICD-10-CM | POA: Diagnosis not present

## 2020-10-11 DIAGNOSIS — M1612 Unilateral primary osteoarthritis, left hip: Secondary | ICD-10-CM | POA: Diagnosis not present

## 2020-10-11 DIAGNOSIS — I10 Essential (primary) hypertension: Secondary | ICD-10-CM | POA: Diagnosis not present

## 2020-10-11 DIAGNOSIS — E119 Type 2 diabetes mellitus without complications: Secondary | ICD-10-CM | POA: Diagnosis not present

## 2020-10-12 DIAGNOSIS — J449 Chronic obstructive pulmonary disease, unspecified: Secondary | ICD-10-CM | POA: Diagnosis not present

## 2020-10-12 DIAGNOSIS — D62 Acute posthemorrhagic anemia: Secondary | ICD-10-CM | POA: Diagnosis not present

## 2020-10-12 DIAGNOSIS — I10 Essential (primary) hypertension: Secondary | ICD-10-CM | POA: Diagnosis not present

## 2020-10-12 DIAGNOSIS — K219 Gastro-esophageal reflux disease without esophagitis: Secondary | ICD-10-CM | POA: Diagnosis not present

## 2020-10-12 DIAGNOSIS — M1612 Unilateral primary osteoarthritis, left hip: Secondary | ICD-10-CM | POA: Diagnosis not present

## 2020-10-12 DIAGNOSIS — K5090 Crohn's disease, unspecified, without complications: Secondary | ICD-10-CM | POA: Diagnosis not present

## 2020-10-12 DIAGNOSIS — S72142D Displaced intertrochanteric fracture of left femur, subsequent encounter for closed fracture with routine healing: Secondary | ICD-10-CM | POA: Diagnosis not present

## 2020-10-12 DIAGNOSIS — E119 Type 2 diabetes mellitus without complications: Secondary | ICD-10-CM | POA: Diagnosis not present

## 2020-10-12 DIAGNOSIS — E785 Hyperlipidemia, unspecified: Secondary | ICD-10-CM | POA: Diagnosis not present

## 2020-10-14 DIAGNOSIS — S72142D Displaced intertrochanteric fracture of left femur, subsequent encounter for closed fracture with routine healing: Secondary | ICD-10-CM | POA: Diagnosis not present

## 2020-10-14 DIAGNOSIS — E119 Type 2 diabetes mellitus without complications: Secondary | ICD-10-CM | POA: Diagnosis not present

## 2020-10-14 DIAGNOSIS — K5090 Crohn's disease, unspecified, without complications: Secondary | ICD-10-CM | POA: Diagnosis not present

## 2020-10-14 DIAGNOSIS — I10 Essential (primary) hypertension: Secondary | ICD-10-CM | POA: Diagnosis not present

## 2020-10-14 DIAGNOSIS — M1612 Unilateral primary osteoarthritis, left hip: Secondary | ICD-10-CM | POA: Diagnosis not present

## 2020-10-14 DIAGNOSIS — J449 Chronic obstructive pulmonary disease, unspecified: Secondary | ICD-10-CM | POA: Diagnosis not present

## 2020-10-14 DIAGNOSIS — K219 Gastro-esophageal reflux disease without esophagitis: Secondary | ICD-10-CM | POA: Diagnosis not present

## 2020-10-14 DIAGNOSIS — D62 Acute posthemorrhagic anemia: Secondary | ICD-10-CM | POA: Diagnosis not present

## 2020-10-14 DIAGNOSIS — E785 Hyperlipidemia, unspecified: Secondary | ICD-10-CM | POA: Diagnosis not present

## 2020-10-18 DIAGNOSIS — K5090 Crohn's disease, unspecified, without complications: Secondary | ICD-10-CM | POA: Diagnosis not present

## 2020-10-18 DIAGNOSIS — I10 Essential (primary) hypertension: Secondary | ICD-10-CM | POA: Diagnosis not present

## 2020-10-18 DIAGNOSIS — M1612 Unilateral primary osteoarthritis, left hip: Secondary | ICD-10-CM | POA: Diagnosis not present

## 2020-10-18 DIAGNOSIS — J449 Chronic obstructive pulmonary disease, unspecified: Secondary | ICD-10-CM | POA: Diagnosis not present

## 2020-10-18 DIAGNOSIS — S72142D Displaced intertrochanteric fracture of left femur, subsequent encounter for closed fracture with routine healing: Secondary | ICD-10-CM | POA: Diagnosis not present

## 2020-10-18 DIAGNOSIS — E119 Type 2 diabetes mellitus without complications: Secondary | ICD-10-CM | POA: Diagnosis not present

## 2020-10-18 DIAGNOSIS — E785 Hyperlipidemia, unspecified: Secondary | ICD-10-CM | POA: Diagnosis not present

## 2020-10-18 DIAGNOSIS — K219 Gastro-esophageal reflux disease without esophagitis: Secondary | ICD-10-CM | POA: Diagnosis not present

## 2020-10-18 DIAGNOSIS — D62 Acute posthemorrhagic anemia: Secondary | ICD-10-CM | POA: Diagnosis not present

## 2020-10-20 DIAGNOSIS — S72142D Displaced intertrochanteric fracture of left femur, subsequent encounter for closed fracture with routine healing: Secondary | ICD-10-CM | POA: Diagnosis not present

## 2020-10-20 DIAGNOSIS — D62 Acute posthemorrhagic anemia: Secondary | ICD-10-CM | POA: Diagnosis not present

## 2020-10-20 DIAGNOSIS — E119 Type 2 diabetes mellitus without complications: Secondary | ICD-10-CM | POA: Diagnosis not present

## 2020-10-20 DIAGNOSIS — I10 Essential (primary) hypertension: Secondary | ICD-10-CM | POA: Diagnosis not present

## 2020-10-21 DIAGNOSIS — K219 Gastro-esophageal reflux disease without esophagitis: Secondary | ICD-10-CM | POA: Diagnosis not present

## 2020-10-21 DIAGNOSIS — M1612 Unilateral primary osteoarthritis, left hip: Secondary | ICD-10-CM | POA: Diagnosis not present

## 2020-10-21 DIAGNOSIS — D62 Acute posthemorrhagic anemia: Secondary | ICD-10-CM | POA: Diagnosis not present

## 2020-10-21 DIAGNOSIS — S72142D Displaced intertrochanteric fracture of left femur, subsequent encounter for closed fracture with routine healing: Secondary | ICD-10-CM | POA: Diagnosis not present

## 2020-10-21 DIAGNOSIS — J449 Chronic obstructive pulmonary disease, unspecified: Secondary | ICD-10-CM | POA: Diagnosis not present

## 2020-10-21 DIAGNOSIS — I10 Essential (primary) hypertension: Secondary | ICD-10-CM | POA: Diagnosis not present

## 2020-10-21 DIAGNOSIS — E119 Type 2 diabetes mellitus without complications: Secondary | ICD-10-CM | POA: Diagnosis not present

## 2020-10-21 DIAGNOSIS — K5090 Crohn's disease, unspecified, without complications: Secondary | ICD-10-CM | POA: Diagnosis not present

## 2020-10-21 DIAGNOSIS — E785 Hyperlipidemia, unspecified: Secondary | ICD-10-CM | POA: Diagnosis not present

## 2020-10-25 DIAGNOSIS — I10 Essential (primary) hypertension: Secondary | ICD-10-CM | POA: Diagnosis not present

## 2020-10-25 DIAGNOSIS — D62 Acute posthemorrhagic anemia: Secondary | ICD-10-CM | POA: Diagnosis not present

## 2020-10-25 DIAGNOSIS — S72142D Displaced intertrochanteric fracture of left femur, subsequent encounter for closed fracture with routine healing: Secondary | ICD-10-CM | POA: Diagnosis not present

## 2020-10-25 DIAGNOSIS — J449 Chronic obstructive pulmonary disease, unspecified: Secondary | ICD-10-CM | POA: Diagnosis not present

## 2020-10-25 DIAGNOSIS — M1612 Unilateral primary osteoarthritis, left hip: Secondary | ICD-10-CM | POA: Diagnosis not present

## 2020-10-25 DIAGNOSIS — E119 Type 2 diabetes mellitus without complications: Secondary | ICD-10-CM | POA: Diagnosis not present

## 2020-10-25 DIAGNOSIS — K219 Gastro-esophageal reflux disease without esophagitis: Secondary | ICD-10-CM | POA: Diagnosis not present

## 2020-10-25 DIAGNOSIS — K5090 Crohn's disease, unspecified, without complications: Secondary | ICD-10-CM | POA: Diagnosis not present

## 2020-10-25 DIAGNOSIS — E785 Hyperlipidemia, unspecified: Secondary | ICD-10-CM | POA: Diagnosis not present

## 2020-10-27 DIAGNOSIS — M1612 Unilateral primary osteoarthritis, left hip: Secondary | ICD-10-CM | POA: Diagnosis not present

## 2020-10-27 DIAGNOSIS — E785 Hyperlipidemia, unspecified: Secondary | ICD-10-CM | POA: Diagnosis not present

## 2020-10-27 DIAGNOSIS — E119 Type 2 diabetes mellitus without complications: Secondary | ICD-10-CM | POA: Diagnosis not present

## 2020-10-27 DIAGNOSIS — D62 Acute posthemorrhagic anemia: Secondary | ICD-10-CM | POA: Diagnosis not present

## 2020-10-27 DIAGNOSIS — J449 Chronic obstructive pulmonary disease, unspecified: Secondary | ICD-10-CM | POA: Diagnosis not present

## 2020-10-27 DIAGNOSIS — K219 Gastro-esophageal reflux disease without esophagitis: Secondary | ICD-10-CM | POA: Diagnosis not present

## 2020-10-27 DIAGNOSIS — I10 Essential (primary) hypertension: Secondary | ICD-10-CM | POA: Diagnosis not present

## 2020-10-27 DIAGNOSIS — K5090 Crohn's disease, unspecified, without complications: Secondary | ICD-10-CM | POA: Diagnosis not present

## 2020-10-27 DIAGNOSIS — S72142D Displaced intertrochanteric fracture of left femur, subsequent encounter for closed fracture with routine healing: Secondary | ICD-10-CM | POA: Diagnosis not present

## 2020-11-03 DIAGNOSIS — K219 Gastro-esophageal reflux disease without esophagitis: Secondary | ICD-10-CM | POA: Diagnosis not present

## 2020-11-03 DIAGNOSIS — K5090 Crohn's disease, unspecified, without complications: Secondary | ICD-10-CM | POA: Diagnosis not present

## 2020-11-03 DIAGNOSIS — I10 Essential (primary) hypertension: Secondary | ICD-10-CM | POA: Diagnosis not present

## 2020-11-03 DIAGNOSIS — D62 Acute posthemorrhagic anemia: Secondary | ICD-10-CM | POA: Diagnosis not present

## 2020-11-03 DIAGNOSIS — E785 Hyperlipidemia, unspecified: Secondary | ICD-10-CM | POA: Diagnosis not present

## 2020-11-03 DIAGNOSIS — E119 Type 2 diabetes mellitus without complications: Secondary | ICD-10-CM | POA: Diagnosis not present

## 2020-11-03 DIAGNOSIS — S72142D Displaced intertrochanteric fracture of left femur, subsequent encounter for closed fracture with routine healing: Secondary | ICD-10-CM | POA: Diagnosis not present

## 2020-11-03 DIAGNOSIS — M1612 Unilateral primary osteoarthritis, left hip: Secondary | ICD-10-CM | POA: Diagnosis not present

## 2020-11-03 DIAGNOSIS — J449 Chronic obstructive pulmonary disease, unspecified: Secondary | ICD-10-CM | POA: Diagnosis not present

## 2020-11-04 DIAGNOSIS — I10 Essential (primary) hypertension: Secondary | ICD-10-CM | POA: Diagnosis not present

## 2020-11-04 DIAGNOSIS — S72142D Displaced intertrochanteric fracture of left femur, subsequent encounter for closed fracture with routine healing: Secondary | ICD-10-CM | POA: Diagnosis not present

## 2020-11-04 DIAGNOSIS — K5090 Crohn's disease, unspecified, without complications: Secondary | ICD-10-CM | POA: Diagnosis not present

## 2020-11-04 DIAGNOSIS — K219 Gastro-esophageal reflux disease without esophagitis: Secondary | ICD-10-CM | POA: Diagnosis not present

## 2020-11-04 DIAGNOSIS — E119 Type 2 diabetes mellitus without complications: Secondary | ICD-10-CM | POA: Diagnosis not present

## 2020-11-04 DIAGNOSIS — E785 Hyperlipidemia, unspecified: Secondary | ICD-10-CM | POA: Diagnosis not present

## 2020-11-04 DIAGNOSIS — M1612 Unilateral primary osteoarthritis, left hip: Secondary | ICD-10-CM | POA: Diagnosis not present

## 2020-11-04 DIAGNOSIS — J449 Chronic obstructive pulmonary disease, unspecified: Secondary | ICD-10-CM | POA: Diagnosis not present

## 2020-11-04 DIAGNOSIS — D62 Acute posthemorrhagic anemia: Secondary | ICD-10-CM | POA: Diagnosis not present

## 2020-11-07 DIAGNOSIS — K5090 Crohn's disease, unspecified, without complications: Secondary | ICD-10-CM | POA: Diagnosis not present

## 2020-11-07 DIAGNOSIS — E785 Hyperlipidemia, unspecified: Secondary | ICD-10-CM | POA: Diagnosis not present

## 2020-11-07 DIAGNOSIS — S72142D Displaced intertrochanteric fracture of left femur, subsequent encounter for closed fracture with routine healing: Secondary | ICD-10-CM | POA: Diagnosis not present

## 2020-11-07 DIAGNOSIS — E119 Type 2 diabetes mellitus without complications: Secondary | ICD-10-CM | POA: Diagnosis not present

## 2020-11-07 DIAGNOSIS — D62 Acute posthemorrhagic anemia: Secondary | ICD-10-CM | POA: Diagnosis not present

## 2020-11-07 DIAGNOSIS — K219 Gastro-esophageal reflux disease without esophagitis: Secondary | ICD-10-CM | POA: Diagnosis not present

## 2020-11-07 DIAGNOSIS — I10 Essential (primary) hypertension: Secondary | ICD-10-CM | POA: Diagnosis not present

## 2020-11-07 DIAGNOSIS — J449 Chronic obstructive pulmonary disease, unspecified: Secondary | ICD-10-CM | POA: Diagnosis not present

## 2020-11-07 DIAGNOSIS — M1612 Unilateral primary osteoarthritis, left hip: Secondary | ICD-10-CM | POA: Diagnosis not present

## 2020-11-08 DIAGNOSIS — M1612 Unilateral primary osteoarthritis, left hip: Secondary | ICD-10-CM | POA: Diagnosis not present

## 2020-11-08 DIAGNOSIS — S72142D Displaced intertrochanteric fracture of left femur, subsequent encounter for closed fracture with routine healing: Secondary | ICD-10-CM | POA: Diagnosis not present

## 2020-11-08 DIAGNOSIS — D62 Acute posthemorrhagic anemia: Secondary | ICD-10-CM | POA: Diagnosis not present

## 2020-11-08 DIAGNOSIS — K219 Gastro-esophageal reflux disease without esophagitis: Secondary | ICD-10-CM | POA: Diagnosis not present

## 2020-11-08 DIAGNOSIS — K5090 Crohn's disease, unspecified, without complications: Secondary | ICD-10-CM | POA: Diagnosis not present

## 2020-11-08 DIAGNOSIS — I10 Essential (primary) hypertension: Secondary | ICD-10-CM | POA: Diagnosis not present

## 2020-11-08 DIAGNOSIS — E785 Hyperlipidemia, unspecified: Secondary | ICD-10-CM | POA: Diagnosis not present

## 2020-11-08 DIAGNOSIS — J449 Chronic obstructive pulmonary disease, unspecified: Secondary | ICD-10-CM | POA: Diagnosis not present

## 2020-11-08 DIAGNOSIS — E119 Type 2 diabetes mellitus without complications: Secondary | ICD-10-CM | POA: Diagnosis not present

## 2020-11-16 DIAGNOSIS — E119 Type 2 diabetes mellitus without complications: Secondary | ICD-10-CM | POA: Diagnosis not present

## 2020-11-16 DIAGNOSIS — K5090 Crohn's disease, unspecified, without complications: Secondary | ICD-10-CM | POA: Diagnosis not present

## 2020-11-16 DIAGNOSIS — J449 Chronic obstructive pulmonary disease, unspecified: Secondary | ICD-10-CM | POA: Diagnosis not present

## 2020-11-16 DIAGNOSIS — I10 Essential (primary) hypertension: Secondary | ICD-10-CM | POA: Diagnosis not present

## 2020-11-16 DIAGNOSIS — D62 Acute posthemorrhagic anemia: Secondary | ICD-10-CM | POA: Diagnosis not present

## 2020-11-16 DIAGNOSIS — K219 Gastro-esophageal reflux disease without esophagitis: Secondary | ICD-10-CM | POA: Diagnosis not present

## 2020-11-16 DIAGNOSIS — S72142D Displaced intertrochanteric fracture of left femur, subsequent encounter for closed fracture with routine healing: Secondary | ICD-10-CM | POA: Diagnosis not present

## 2020-11-16 DIAGNOSIS — M1612 Unilateral primary osteoarthritis, left hip: Secondary | ICD-10-CM | POA: Diagnosis not present

## 2020-11-16 DIAGNOSIS — E785 Hyperlipidemia, unspecified: Secondary | ICD-10-CM | POA: Diagnosis not present

## 2020-11-18 DIAGNOSIS — I10 Essential (primary) hypertension: Secondary | ICD-10-CM | POA: Diagnosis not present

## 2020-11-18 DIAGNOSIS — J449 Chronic obstructive pulmonary disease, unspecified: Secondary | ICD-10-CM | POA: Diagnosis not present

## 2020-11-18 DIAGNOSIS — E119 Type 2 diabetes mellitus without complications: Secondary | ICD-10-CM | POA: Diagnosis not present

## 2020-11-18 DIAGNOSIS — D62 Acute posthemorrhagic anemia: Secondary | ICD-10-CM | POA: Diagnosis not present

## 2020-11-18 DIAGNOSIS — K219 Gastro-esophageal reflux disease without esophagitis: Secondary | ICD-10-CM | POA: Diagnosis not present

## 2020-11-18 DIAGNOSIS — M1612 Unilateral primary osteoarthritis, left hip: Secondary | ICD-10-CM | POA: Diagnosis not present

## 2020-11-18 DIAGNOSIS — K5090 Crohn's disease, unspecified, without complications: Secondary | ICD-10-CM | POA: Diagnosis not present

## 2020-11-18 DIAGNOSIS — E785 Hyperlipidemia, unspecified: Secondary | ICD-10-CM | POA: Diagnosis not present

## 2020-11-18 DIAGNOSIS — S72142D Displaced intertrochanteric fracture of left femur, subsequent encounter for closed fracture with routine healing: Secondary | ICD-10-CM | POA: Diagnosis not present

## 2020-11-22 DIAGNOSIS — D62 Acute posthemorrhagic anemia: Secondary | ICD-10-CM | POA: Diagnosis not present

## 2020-11-22 DIAGNOSIS — E785 Hyperlipidemia, unspecified: Secondary | ICD-10-CM | POA: Diagnosis not present

## 2020-11-22 DIAGNOSIS — K5090 Crohn's disease, unspecified, without complications: Secondary | ICD-10-CM | POA: Diagnosis not present

## 2020-11-22 DIAGNOSIS — M1612 Unilateral primary osteoarthritis, left hip: Secondary | ICD-10-CM | POA: Diagnosis not present

## 2020-11-22 DIAGNOSIS — E119 Type 2 diabetes mellitus without complications: Secondary | ICD-10-CM | POA: Diagnosis not present

## 2020-11-22 DIAGNOSIS — J449 Chronic obstructive pulmonary disease, unspecified: Secondary | ICD-10-CM | POA: Diagnosis not present

## 2020-11-22 DIAGNOSIS — I10 Essential (primary) hypertension: Secondary | ICD-10-CM | POA: Diagnosis not present

## 2020-11-22 DIAGNOSIS — K219 Gastro-esophageal reflux disease without esophagitis: Secondary | ICD-10-CM | POA: Diagnosis not present

## 2020-11-22 DIAGNOSIS — S72142D Displaced intertrochanteric fracture of left femur, subsequent encounter for closed fracture with routine healing: Secondary | ICD-10-CM | POA: Diagnosis not present

## 2020-11-24 DIAGNOSIS — M810 Age-related osteoporosis without current pathological fracture: Secondary | ICD-10-CM | POA: Diagnosis not present

## 2020-11-24 DIAGNOSIS — K5090 Crohn's disease, unspecified, without complications: Secondary | ICD-10-CM | POA: Diagnosis not present

## 2020-11-24 DIAGNOSIS — E1065 Type 1 diabetes mellitus with hyperglycemia: Secondary | ICD-10-CM | POA: Diagnosis not present

## 2020-11-24 DIAGNOSIS — J449 Chronic obstructive pulmonary disease, unspecified: Secondary | ICD-10-CM | POA: Diagnosis not present

## 2020-11-24 DIAGNOSIS — I5032 Chronic diastolic (congestive) heart failure: Secondary | ICD-10-CM | POA: Diagnosis not present

## 2020-11-24 DIAGNOSIS — E7800 Pure hypercholesterolemia, unspecified: Secondary | ICD-10-CM | POA: Diagnosis not present

## 2020-11-24 DIAGNOSIS — Z0000 Encounter for general adult medical examination without abnormal findings: Secondary | ICD-10-CM | POA: Diagnosis not present

## 2020-11-24 DIAGNOSIS — I10 Essential (primary) hypertension: Secondary | ICD-10-CM | POA: Diagnosis not present

## 2020-11-28 DIAGNOSIS — S72142D Displaced intertrochanteric fracture of left femur, subsequent encounter for closed fracture with routine healing: Secondary | ICD-10-CM | POA: Diagnosis not present

## 2020-12-05 DIAGNOSIS — E1065 Type 1 diabetes mellitus with hyperglycemia: Secondary | ICD-10-CM | POA: Diagnosis not present

## 2020-12-05 DIAGNOSIS — Z0000 Encounter for general adult medical examination without abnormal findings: Secondary | ICD-10-CM | POA: Diagnosis not present

## 2020-12-05 DIAGNOSIS — J432 Centrilobular emphysema: Secondary | ICD-10-CM | POA: Diagnosis not present

## 2020-12-05 DIAGNOSIS — I10 Essential (primary) hypertension: Secondary | ICD-10-CM | POA: Diagnosis not present

## 2020-12-05 DIAGNOSIS — K50918 Crohn's disease, unspecified, with other complication: Secondary | ICD-10-CM | POA: Diagnosis not present

## 2020-12-05 DIAGNOSIS — M810 Age-related osteoporosis without current pathological fracture: Secondary | ICD-10-CM | POA: Diagnosis not present

## 2020-12-28 DIAGNOSIS — M810 Age-related osteoporosis without current pathological fracture: Secondary | ICD-10-CM | POA: Diagnosis not present

## 2021-01-04 DIAGNOSIS — M810 Age-related osteoporosis without current pathological fracture: Secondary | ICD-10-CM | POA: Diagnosis not present

## 2021-01-04 DIAGNOSIS — N1831 Chronic kidney disease, stage 3a: Secondary | ICD-10-CM | POA: Diagnosis not present

## 2021-01-04 DIAGNOSIS — J432 Centrilobular emphysema: Secondary | ICD-10-CM | POA: Diagnosis not present

## 2021-01-04 DIAGNOSIS — I5032 Chronic diastolic (congestive) heart failure: Secondary | ICD-10-CM | POA: Diagnosis not present

## 2021-01-20 DIAGNOSIS — E1065 Type 1 diabetes mellitus with hyperglycemia: Secondary | ICD-10-CM | POA: Diagnosis not present

## 2021-01-20 DIAGNOSIS — E1021 Type 1 diabetes mellitus with diabetic nephropathy: Secondary | ICD-10-CM | POA: Diagnosis not present

## 2021-01-20 DIAGNOSIS — N1831 Chronic kidney disease, stage 3a: Secondary | ICD-10-CM | POA: Diagnosis not present

## 2021-01-20 DIAGNOSIS — E1069 Type 1 diabetes mellitus with other specified complication: Secondary | ICD-10-CM | POA: Diagnosis not present

## 2021-04-17 DIAGNOSIS — E108 Type 1 diabetes mellitus with unspecified complications: Secondary | ICD-10-CM | POA: Diagnosis not present

## 2021-04-17 DIAGNOSIS — Z1331 Encounter for screening for depression: Secondary | ICD-10-CM | POA: Diagnosis not present

## 2021-04-17 DIAGNOSIS — Z6825 Body mass index (BMI) 25.0-25.9, adult: Secondary | ICD-10-CM | POA: Diagnosis not present

## 2021-04-17 DIAGNOSIS — J449 Chronic obstructive pulmonary disease, unspecified: Secondary | ICD-10-CM | POA: Diagnosis not present

## 2021-04-17 DIAGNOSIS — E1036 Type 1 diabetes mellitus with diabetic cataract: Secondary | ICD-10-CM | POA: Diagnosis not present

## 2021-04-17 DIAGNOSIS — Z789 Other specified health status: Secondary | ICD-10-CM | POA: Diagnosis not present

## 2021-07-12 DIAGNOSIS — Z1331 Encounter for screening for depression: Secondary | ICD-10-CM | POA: Diagnosis not present

## 2021-07-12 DIAGNOSIS — J449 Chronic obstructive pulmonary disease, unspecified: Secondary | ICD-10-CM | POA: Diagnosis not present

## 2021-07-12 DIAGNOSIS — Z6824 Body mass index (BMI) 24.0-24.9, adult: Secondary | ICD-10-CM | POA: Diagnosis not present

## 2021-07-12 DIAGNOSIS — E108 Type 1 diabetes mellitus with unspecified complications: Secondary | ICD-10-CM | POA: Diagnosis not present

## 2021-07-12 DIAGNOSIS — Z789 Other specified health status: Secondary | ICD-10-CM | POA: Diagnosis not present

## 2021-07-12 DIAGNOSIS — M810 Age-related osteoporosis without current pathological fracture: Secondary | ICD-10-CM | POA: Diagnosis not present

## 2022-12-19 ENCOUNTER — Encounter: Payer: Self-pay | Admitting: *Deleted
